# Patient Record
Sex: Male | Born: 1980 | Race: White | Hispanic: No | Marital: Single | Smoking: Current every day smoker
Health system: Southern US, Community
[De-identification: ages and names within clinical notes are randomized; demographics above are authoritative.]

## PROBLEM LIST (undated history)

## (undated) DIAGNOSIS — R079 Chest pain, unspecified: Secondary | ICD-10-CM

## (undated) DIAGNOSIS — N62 Hypertrophy of breast: Secondary | ICD-10-CM

## (undated) DIAGNOSIS — Z87438 Personal history of other diseases of male genital organs: Secondary | ICD-10-CM

## (undated) DIAGNOSIS — J449 Chronic obstructive pulmonary disease, unspecified: Secondary | ICD-10-CM

## (undated) DIAGNOSIS — I10 Essential (primary) hypertension: Secondary | ICD-10-CM

## (undated) DIAGNOSIS — J45909 Unspecified asthma, uncomplicated: Secondary | ICD-10-CM

## (undated) DIAGNOSIS — I5030 Unspecified diastolic (congestive) heart failure: Secondary | ICD-10-CM

## (undated) DIAGNOSIS — F1110 Opioid abuse, uncomplicated: Secondary | ICD-10-CM

## (undated) DIAGNOSIS — I509 Heart failure, unspecified: Secondary | ICD-10-CM

## (undated) DIAGNOSIS — B182 Chronic viral hepatitis C: Secondary | ICD-10-CM

## (undated) HISTORY — PX: TOOTH EXTRACTION: SUR596

## (undated) HISTORY — DX: Unspecified diastolic (congestive) heart failure: I50.30

## (undated) HISTORY — DX: Hypertrophy of breast: N62

## (undated) HISTORY — DX: Essential (primary) hypertension: I10

## (undated) HISTORY — DX: Chest pain, unspecified: R07.9

## (undated) HISTORY — DX: Heart failure, unspecified: I50.9

---

## 2004-08-13 ENCOUNTER — Emergency Department: Payer: Self-pay | Admitting: Emergency Medicine

## 2005-06-07 ENCOUNTER — Emergency Department: Payer: Self-pay | Admitting: Emergency Medicine

## 2006-12-25 ENCOUNTER — Emergency Department: Payer: Self-pay | Admitting: Emergency Medicine

## 2006-12-25 ENCOUNTER — Other Ambulatory Visit: Payer: Self-pay

## 2007-02-23 ENCOUNTER — Emergency Department: Payer: Self-pay | Admitting: Emergency Medicine

## 2008-02-14 ENCOUNTER — Other Ambulatory Visit: Payer: Self-pay

## 2008-02-14 ENCOUNTER — Emergency Department: Payer: Self-pay

## 2009-02-23 ENCOUNTER — Emergency Department: Payer: Self-pay | Admitting: Emergency Medicine

## 2009-09-15 ENCOUNTER — Emergency Department: Payer: Self-pay | Admitting: Internal Medicine

## 2010-02-25 ENCOUNTER — Emergency Department: Payer: Self-pay | Admitting: Emergency Medicine

## 2010-03-15 ENCOUNTER — Emergency Department (HOSPITAL_COMMUNITY): Admission: EM | Admit: 2010-03-15 | Discharge: 2010-03-15 | Payer: Self-pay | Admitting: Emergency Medicine

## 2010-10-22 ENCOUNTER — Emergency Department: Payer: Self-pay | Admitting: Emergency Medicine

## 2010-11-19 ENCOUNTER — Emergency Department: Payer: Self-pay | Admitting: Emergency Medicine

## 2010-11-23 ENCOUNTER — Emergency Department: Payer: Self-pay | Admitting: Emergency Medicine

## 2011-12-06 ENCOUNTER — Emergency Department: Payer: Self-pay | Admitting: Emergency Medicine

## 2012-02-26 ENCOUNTER — Emergency Department: Payer: Self-pay | Admitting: Emergency Medicine

## 2012-02-26 LAB — COMPREHENSIVE METABOLIC PANEL
Albumin: 3.9 g/dL (ref 3.4–5.0)
Alkaline Phosphatase: 98 U/L (ref 50–136)
Anion Gap: 8 (ref 7–16)
Bilirubin,Total: 0.4 mg/dL (ref 0.2–1.0)
Co2: 27 mmol/L (ref 21–32)
Creatinine: 1.26 mg/dL (ref 0.60–1.30)
Glucose: 89 mg/dL (ref 65–99)
Osmolality: 278 (ref 275–301)
Potassium: 3.8 mmol/L (ref 3.5–5.1)
Sodium: 140 mmol/L (ref 136–145)

## 2012-02-26 LAB — CBC
HCT: 47 % (ref 40.0–52.0)
MCV: 95 fL (ref 80–100)
RBC: 4.98 10*6/uL (ref 4.40–5.90)
RDW: 14 % (ref 11.5–14.5)
WBC: 7.8 10*3/uL (ref 3.8–10.6)

## 2012-03-02 LAB — CULTURE, BLOOD (SINGLE)

## 2012-11-18 ENCOUNTER — Emergency Department: Payer: Self-pay | Admitting: Emergency Medicine

## 2012-11-18 LAB — URINALYSIS, COMPLETE
Blood: NEGATIVE
Glucose,UR: NEGATIVE mg/dL (ref 0–75)
Ph: 5 (ref 4.5–8.0)
Protein: NEGATIVE
RBC,UR: 12 /HPF (ref 0–5)
WBC UR: 135 /HPF (ref 0–5)

## 2013-09-25 ENCOUNTER — Emergency Department: Payer: Self-pay | Admitting: Emergency Medicine

## 2013-09-25 LAB — URINALYSIS, COMPLETE
BACTERIA: NONE SEEN
BILIRUBIN, UR: NEGATIVE
BLOOD: NEGATIVE
Glucose,UR: NEGATIVE mg/dL (ref 0–75)
KETONE: NEGATIVE
LEUKOCYTE ESTERASE: NEGATIVE
Nitrite: NEGATIVE
Ph: 6 (ref 4.5–8.0)
Protein: NEGATIVE
RBC,UR: <1 /HPF (ref 0–5)
Specific Gravity: 1.011 (ref 1.003–1.030)
Squamous Epithelial: NONE SEEN
WBC UR: <1 /HPF (ref 0–5)

## 2013-09-25 LAB — COMPREHENSIVE METABOLIC PANEL
ALT: 26 U/L (ref 12–78)
AST: 20 U/L (ref 15–37)
Albumin: 3.7 g/dL (ref 3.4–5.0)
Alkaline Phosphatase: 63 U/L
Anion Gap: 3 — ABNORMAL LOW (ref 7–16)
BILIRUBIN TOTAL: 0.8 mg/dL (ref 0.2–1.0)
BUN: 12 mg/dL (ref 7–18)
CREATININE: 0.97 mg/dL (ref 0.60–1.30)
Calcium, Total: 8.8 mg/dL (ref 8.5–10.1)
Chloride: 107 mmol/L (ref 98–107)
Co2: 29 mmol/L (ref 21–32)
EGFR (African American): >60
EGFR (Non-African Amer.): >60
Glucose: 71 mg/dL (ref 65–99)
OSMOLALITY: 276 (ref 275–301)
POTASSIUM: 4 mmol/L (ref 3.5–5.1)
Sodium: 139 mmol/L (ref 136–145)
Total Protein: 6.4 g/dL (ref 6.4–8.2)

## 2013-09-25 LAB — CBC
HCT: 47.9 % (ref 40.0–52.0)
HGB: 15.8 g/dL (ref 13.0–18.0)
MCH: 32 pg (ref 26.0–34.0)
MCHC: 32.9 g/dL (ref 32.0–36.0)
MCV: 97 fL (ref 80–100)
PLATELETS: 143 10*3/uL — AB (ref 150–440)
RBC: 4.93 10*6/uL (ref 4.40–5.90)
RDW: 13.3 % (ref 11.5–14.5)
WBC: 8.2 10*3/uL (ref 3.8–10.6)

## 2013-09-25 LAB — LITHIUM LEVEL: LITHIUM: 0.43 mmol/L — AB

## 2014-04-10 ENCOUNTER — Emergency Department: Payer: Self-pay | Admitting: Internal Medicine

## 2014-04-14 ENCOUNTER — Emergency Department: Payer: Self-pay | Admitting: Emergency Medicine

## 2014-04-21 ENCOUNTER — Emergency Department: Payer: Self-pay | Admitting: Emergency Medicine

## 2014-05-12 ENCOUNTER — Emergency Department: Payer: Self-pay | Admitting: Emergency Medicine

## 2014-09-01 ENCOUNTER — Emergency Department
Admit: 2014-09-01 | Disposition: A | Discharge status: Home / Self Care | Payer: Self-pay | Admitting: Emergency Medicine

## 2014-09-05 ENCOUNTER — Inpatient Hospital Stay
Admit: 2014-09-05 | Disposition: A | Discharge status: Home / Self Care | Payer: Self-pay | Attending: Internal Medicine | Admitting: Internal Medicine

## 2014-09-05 LAB — COMPREHENSIVE METABOLIC PANEL
ALBUMIN: 3.5 g/dL
ALK PHOS: 94 U/L
ALT: 38 U/L
ANION GAP: 9 (ref 7–16)
BILIRUBIN TOTAL: 0.3 mg/dL
BUN: 9 mg/dL
CHLORIDE: 101 mmol/L
CREATININE: 0.96 mg/dL
Calcium, Total: 9.1 mg/dL
Co2: 31 mmol/L
EGFR (African American): >60
EGFR (Non-African Amer.): >60
GLUCOSE: 111 mg/dL — AB
Potassium: 3.8 mmol/L
SGOT(AST): 25 U/L
Sodium: 141 mmol/L
TOTAL PROTEIN: 7.4 g/dL

## 2014-09-05 LAB — ETHANOL: Ethanol: <5 mg/dL

## 2014-09-05 LAB — LACTATE DEHYDROGENASE: LDH: 155 U/L

## 2014-09-05 LAB — CBC
HCT: 44.7 % (ref 40.0–52.0)
HGB: 14.5 g/dL (ref 13.0–18.0)
MCH: 30.1 pg (ref 26.0–34.0)
MCHC: 32.5 g/dL (ref 32.0–36.0)
MCV: 93 fL (ref 80–100)
Platelet: 323 10*3/uL (ref 150–440)
RBC: 4.83 10*6/uL (ref 4.40–5.90)
RDW: 14.2 % (ref 11.5–14.5)
WBC: 20.8 10*3/uL — ABNORMAL HIGH (ref 3.8–10.6)

## 2014-09-05 LAB — ED INFLUENZA
Influenza A By PCR: NEGATIVE
Influenza B By PCR: NEGATIVE

## 2014-09-05 LAB — CK TOTAL AND CKMB (NOT AT ARMC)
CK, Total: 136 U/L
CK-MB: 5.9 ng/mL — AB

## 2014-09-05 LAB — TROPONIN I: TROPONIN-I: 0.06 ng/mL — AB

## 2014-09-05 LAB — PRO B NATRIURETIC PEPTIDE: B-TYPE NATIURETIC PEPTID: 259 pg/mL — AB

## 2014-09-06 LAB — LACTIC ACID, PLASMA
LACTIC ACID, VENOUS: 2.8 mmol/L — AB
Lactic Acid, Venous: 1.9 mmol/L

## 2014-09-06 LAB — DRUG SCREEN, URINE
Amphetamines, Ur Screen: NEGATIVE
BARBITURATES, UR SCREEN: NEGATIVE
Benzodiazepine, Ur Scrn: NEGATIVE
CANNABINOID 50 NG, UR ~~LOC~~: POSITIVE
COCAINE METABOLITE, UR ~~LOC~~: NEGATIVE
MDMA (ECSTASY) UR SCREEN: NEGATIVE
Methadone, Ur Screen: NEGATIVE
OPIATE, UR SCREEN: POSITIVE
Phencyclidine (PCP) Ur S: NEGATIVE
Tricyclic, Ur Screen: NEGATIVE

## 2014-09-06 LAB — COMPREHENSIVE METABOLIC PANEL
ANION GAP: 4 — AB (ref 7–16)
Albumin: 2.8 g/dL — ABNORMAL LOW
Alkaline Phosphatase: 91 U/L
BILIRUBIN TOTAL: 0.5 mg/dL
BUN: 10 mg/dL
CALCIUM: 7.9 mg/dL — AB
CHLORIDE: 104 mmol/L
CO2: 30 mmol/L
CREATININE: 0.92 mg/dL
Glucose: 153 mg/dL — ABNORMAL HIGH
Potassium: 4.4 mmol/L
SGOT(AST): 30 U/L
SGPT (ALT): 36 U/L
SODIUM: 138 mmol/L
Total Protein: 6.3 g/dL — ABNORMAL LOW

## 2014-09-06 LAB — CBC WITH DIFFERENTIAL/PLATELET
BASOS ABS: 0 10*3/uL (ref 0.0–0.1)
BASOS PCT: 0.1 %
Eosinophil #: 0 10*3/uL (ref 0.0–0.7)
Eosinophil %: 0 %
HCT: 40.4 % (ref 40.0–52.0)
HGB: 13 g/dL (ref 13.0–18.0)
LYMPHS PCT: 3.2 %
Lymphocyte #: 0.5 10*3/uL — ABNORMAL LOW (ref 1.0–3.6)
MCH: 29.9 pg (ref 26.0–34.0)
MCHC: 32.3 g/dL (ref 32.0–36.0)
MCV: 93 fL (ref 80–100)
MONOS PCT: 1.6 %
Monocyte #: 0.3 x10 3/mm (ref 0.2–1.0)
NEUTROS ABS: 15.9 10*3/uL — AB (ref 1.4–6.5)
NEUTROS PCT: 95.1 %
Platelet: 284 10*3/uL (ref 150–440)
RBC: 4.36 10*6/uL — ABNORMAL LOW (ref 4.40–5.90)
RDW: 14.1 % (ref 11.5–14.5)
WBC: 16.7 10*3/uL — AB (ref 3.8–10.6)

## 2014-09-06 LAB — TROPONIN I
TROPONIN-I: 0.03 ng/mL
Troponin-I: 0.05 ng/mL — ABNORMAL HIGH

## 2014-09-06 LAB — RAPID HIV SCREEN (HIV 1/2 AB+AG)

## 2014-09-08 LAB — EXPECTORATED SPUTUM ASSESSMENT W GRAM STAIN, RFLX TO RESP C

## 2014-09-10 LAB — CULTURE, BLOOD (SINGLE)

## 2014-09-25 NOTE — Discharge Summary (Signed)
PATIENT NAME:  Nathan Vasquez, [name] MR#:  [id number] DATE OF BIRTH:  Aug 05, 1980  DATE OF ADMISSION:  09/05/2014 DATE OF DISCHARGE:  09/07/2014  DISCHARGE DIAGNOSES: 1.  Sepsis.  2.  Pneumonia.  3.  Elevated troponins due to supply/demand ischemia.  4.  Substance abuse with urine drug screen being positive.   SECONDARY DIAGNOSIS: None.   CONSULTATIONS: None.   PROCEDURES AND RADIOLOGY: Chest x-ray on 11th of April showed possible bronchitis or bronchiolitis.   CT scan of the chest on 11th of April showed diffuse patchy tree-in-bird type nodular airspace opacities and bronchial wall thickening concerning for small airways infection. Small pericardial effusion.   MAJOR LABORATORY PANEL: Blood cultures x2 were negative on 11th of April.  Sputum culture grew normal flora.   Influenza test was negative.   HIV was nonreactive.   HISTORY AND SHORT HOSPITAL COURSE: The patient is a 34 year old male with no medical problems who was admitted for shortness of breath and was found to have sepsis secondary to pneumonia. Please see Dr. Deatra Inaavid Hower's dictated history and physical for further details. The patient was very anxious the next day wanting to leave. He was explained all the risks and benefit of leaving without complete treatment. Finally, he agreed to stay over but still left on the day of the 13th of April after back and forth discussion. He was not too happy about being in the hospital as he wanted to smoke outside, which was against policy. He was feeling much better by 13th of April anyway and was discharged home in stable condition.   PERTINENT DISCHARGE PHYSICAL EXAMINATION: VITAL SIGNS: On the date of discharge, his temperature was 97.9, heart rate 76 per minute, respirations 20 per minute, blood pressure 125/76 and he was saturating 90% on room air.  CARDIOVASCULAR: S1, S2 normal. No murmurs, rubs, or gallops.  LUNGS: Clear to auscultation bilaterally. No wheezing, rales, rhonchi, or  crepitation.  ABDOMEN: Soft, benign.  NEUROLOGIC: Nonfocal examination. All other physical examination remained at baseline.  DISCHARGE MEDICATIONS:  Medication Instructions  benzonatate 200 mg oral capsule  1 cap(s) orally every 8 hours x 5 days, As Needed, cough , As needed, cough   levaquin 750 mg oral tablet  1 tab(s) orally every 24 hours x 5 days   prednisone 10 mg oral tablet  Start at 60 mg and taper by 10 mg daily until complete     DISCHARGE DIET: Regular.   DISCHARGE ACTIVITY: As tolerated.   DISCHARGE INSTRUCTIONS AND FOLLOWUP: The patient was instructed to follow up with his primary care physician at [facility].  He remains at high risk for readmission.   TOTAL TIME DISCHARGING PATIENT: 45 minutes. ____________________________ Ellamae SiaVipul S. Sherryll BurgerShah, MD vss:sb D: 09/08/2014 12:27:27 ET T: 09/08/2014 12:46:58 ET JOB#: [id number]  cc: Lindsie Simar S. Sherryll BurgerShah, MD, <Dictator> [facility] - PCP Ellamae SiaVIPUL S [facility] ELECTRONICALLY SIGNED 09/08/2014 13:24

## 2014-09-25 NOTE — H&P (Signed)
PATIENT NAME:  Nathan Vasquez, Nathan R MR#:  [id number] DATE OF BIRTH:  1981-04-07  DATE OF ADMISSION:  09/06/2014  REFERRING PHYSICIAN:  Cecille AmsterdamJonathan E. Mayford KnifeWilliams, MD   PRIMARY CARE PHYSICIAN:  None.   CHIEF COMPLAINT:  Shortness of breath.   HISTORY OF PRESENT ILLNESS:  A 34 year old Caucasian gentleman without significant past medical history, as he seeks no medical attention, presenting with cough and shortness of breath. He describes 10 days in total duration of symptoms originally starting as URI with associated cough, which has subsequently become productive of greenish sputum. He was originally evaluated at [facility] Emergency Department 5 days ago and discharged with essentially cough medication and some steroids. He had no improvement. He subsequently developed fevers, chills, and shortness of breath. The cough worsened. It is still productive of greenish sputum. He decided to present to the hospital for further workup and evaluation. Upon arrival to the Emergency Department, he was noted to be febrile, tachycardic, and hypoxic, saturating 85% on room air, requiring supplemental O2 to keep SaO2 greater than 92%.   REVIEW OF SYSTEMS: CONSTITUTIONAL:  Positive for fevers, chills, fatigue, and weakness.  EYES:  Denies blurred vision, double vision, or eye pain.  EARS, NOSE, AND THROAT:  Denies tinnitus, ear pain, or hearing loss.  RESPIRATORY:  Positive for cough, wheeze, or shortness of breath.  CARDIOVASCULAR:  Denies chest pain, palpitations, or edema.  GASTROINTESTINAL:  Denies nausea, vomiting, diarrhea, or abdominal pain.  GENITOURINARY:  Denies history of hematuria.  ENDOCRINE:  Denies nocturia or thyroid problems.  HEMATOLOGIC AND LYMPHATIC:  Denies easy bruising or bleeding.  SKIN:  Denies rash or lesion.  MUSCULOSKELETAL:  Denies pain in the neck, back, shoulders, knees, or hips or arthritic symptoms.  NEUROLOGIC:  Denies paralysis or paresthesias.  PSYCHIATRIC:   Denies anxiety or depressive symptoms.   Otherwise, full review of systems performed by me is negative.   PAST MEDICAL HISTORY:  None. He seeks no medical care.   SOCIAL HISTORY:  Everyday tobacco use. Denies any alcohol or drug use.   FAMILY HISTORY:  Positive for coronary artery disease.   ALLERGIES:  PENICILLIN.   HOME MEDICATIONS:  None.   PHYSICAL EXAMINATION: VITAL SIGNS:  Temperature 102 degrees Fahrenheit, heart rate 125, respirations 24, blood pressure 134/79, saturating 96% on supplemental oxygen. Weight 101.8 kg, BMI 30.5.  GENERAL:  Ill-appearing Caucasian gentleman in minimal respiratory distress.  HEAD:  Normocephalic, atraumatic.  EYES:  Pupils are equal, round, and reactive to light. Extraocular muscles are intact. No scleral icterus.  MOUTH:  Moist mucosal membrane. Dentition is intact. No abscess noted.  EARS, NOSE, AND THROAT:  Clear without exudates. No external lesions.  NECK:  Supple. No thyromegaly. No nodules. No JVD.  PULMONARY:  Greatly diminished breath sounds throughout all lung fields. Minimal basilar coarse rhonchi. No wheezing. Tachypneic without the use of accessory muscles.  CHEST:  Nontender to palpation.  CARDIOVASCULAR:  S1 and S2, tachycardic. No murmurs, rubs, or gallops. No edema. Pedal pulses are 2+ bilaterally.  GASTROINTESTINAL:  Soft, nontender, nondistended. No masses. Positive bowel sounds. No hepatosplenomegaly.  MUSCULOSKELETAL:  No swelling, clubbing, or edema. Full range of motion in all extremities.  NEUROLOGIC:  Cranial nerves II through XII are intact. No gross focal neurological deficits. Sensation is intact. Reflexes are intact.  SKIN:  No ulceration, lesions, rashes, or cyanosis. Skin is diaphoretic and warm.  PSYCHIATRIC:  Mood and affect are within normal limits. The patient is awake, alert, and oriented x  3. Insight and judgment are intact.   LABORATORY DATA:  Chest x-ray performed, which reveals hyperinflation, bronchial  thickening suggestive of bronchitis versus bronchiolitis. CT of the chest performed is negative for PE, diffuse patchy type nodular airspace opacifications. Remainder of laboratory data:  Sodium 141, potassium 3.8, chloride 101, bicarbonate 31, BUN 9, creatinine 0.96, glucose of 111. LFTs are within normal limits. Troponin is 0.06. WBC is 20.8, hemoglobin 14.5, and platelets are 323,000. ABG performed:  PH of 7.43, CO2 of 46, and O2 of 59.   ASSESSMENT AND PLAN:  A 34 year old Caucasian gentleman without significant past medical history, presenting with cough.   1.  Sepsis secondary to community-acquired pneumonia, meeting septic criteria by temperature, leukocytosis, heart rate, respiratory rate, secondary to community-acquired pneumonia. I will panculture including blood and sputum. Follow culture data. Adjust antibiotics accordingly. Intravenous fluid hydration received 30 mL/kg IV fluid bolus. Continue IV fluid hydration to keep mean arterial pressure greater than 65. As far as antibiotic coverage, he has already received Levaquin and aztreonam in the Emergency Department. We will continue this antibiotic coverage given his PENICILLIN ALLERGY. We will provide DuoNeb treatments q. 4 hours and supplemental oxygen to keep SaO2 greater than 92%, as well as steroids, Solu-Medrol 60 mg IV daily. HIV test is also ordered and pending at this time.  2.  Elevated troponin. We will place on telemetry. Trend cardiac enzymes x 3; if remarkably upward trend, we will place on heparin drip. No indication at this time.  3.  Venous thromboembolism prophylaxis with heparin subcutaneously.   CODE STATUS:  The patient is a full code.   TIME SPENT:  45 minutes.    ____________________________ Cletis Athens. Hower, MD dkh:nb D: 09/06/2014 02:29:59 ET T: 09/06/2014 04:01:40 ET JOB#: [id number]  cc: Cletis Athens. Hower, MD, <Dictator> DAVID Synetta Shadow MD ELECTRONICALLY SIGNED 09/07/2014 2:11

## 2015-01-02 ENCOUNTER — Emergency Department
Admission: EM | Admit: 2015-01-02 | Discharge: 2015-01-02 | Disposition: A | Discharge status: Home / Self Care | Payer: BLUE CROSS/BLUE SHIELD | Attending: Emergency Medicine | Admitting: Emergency Medicine

## 2015-01-02 ENCOUNTER — Encounter: Payer: Self-pay | Admitting: Emergency Medicine

## 2015-01-02 DIAGNOSIS — Y998 Other external cause status: Secondary | ICD-10-CM | POA: Insufficient documentation

## 2015-01-02 DIAGNOSIS — Y9389 Activity, other specified: Secondary | ICD-10-CM | POA: Insufficient documentation

## 2015-01-02 DIAGNOSIS — Y9289 Other specified places as the place of occurrence of the external cause: Secondary | ICD-10-CM | POA: Diagnosis not present

## 2015-01-02 DIAGNOSIS — Z720 Tobacco use: Secondary | ICD-10-CM | POA: Insufficient documentation

## 2015-01-02 DIAGNOSIS — K0889 Other specified disorders of teeth and supporting structures: Secondary | ICD-10-CM

## 2015-01-02 DIAGNOSIS — S0993XA Unspecified injury of face, initial encounter: Secondary | ICD-10-CM | POA: Insufficient documentation

## 2015-01-02 HISTORY — DX: Unspecified asthma, uncomplicated: J45.909

## 2015-01-02 MED ORDER — TRAMADOL HCL 50 MG PO TABS: 50.0000 mg | ORAL_TABLET | Freq: Four times a day (QID) | ORAL | Status: DC | PRN

## 2015-01-02 MED ORDER — CLINDAMYCIN HCL 150 MG PO CAPS: 300.0000 mg | ORAL_CAPSULE | Freq: Three times a day (TID) | ORAL | Status: DC

## 2015-01-02 MED ORDER — TRAMADOL HCL 50 MG PO TABS
50.0000 mg | ORAL_TABLET | Freq: Once | ORAL | Status: AC
  Administered 2015-01-02: 50 mg via ORAL
  Filled 2015-01-02: qty 1

## 2015-01-02 MED ORDER — CLINDAMYCIN HCL 150 MG PO CAPS
300.0000 mg | ORAL_CAPSULE | Freq: Once | ORAL | Status: AC
  Administered 2015-01-02: 300 mg via ORAL
  Filled 2015-01-02: qty 2

## 2015-01-02 MED ORDER — CLINDAMYCIN HCL 150 MG PO CAPS: ORAL_CAPSULE | ORAL | Status: DC

## 2015-01-02 MED ORDER — CLINDAMYCIN HCL 150 MG PO CAPS: 300.0000 mg | ORAL_CAPSULE | Freq: Four times a day (QID) | ORAL | Status: DC
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
## 2015-01-02 NOTE — Discharge Instructions (Signed)
Dental Pain °Toothache is pain in or around a tooth. It may get worse with chewing or with cold or heat.  °HOME CARE °· Your dentist may use a numbing medicine during treatment. If so, you may need to avoid eating until the medicine wears off. Ask your dentist about this. °· Only take medicine as told by your dentist or doctor. °· Avoid chewing food near the painful tooth until after all treatment is done. Ask your dentist about this. °GET HELP RIGHT AWAY IF:  °· The problem gets worse or new problems appear. °· You have a fever. °· There is redness and puffiness (swelling) of the face, jaw, or neck. °· You cannot open your mouth. °· There is pain in the jaw. °· There is very bad pain that is not helped by medicine. °MAKE SURE YOU:  °· Understand these instructions. °· Will watch your condition. °· Will get help right away if you are not doing well or get worse. °Document Released: 10/30/2007 Document Revised: 08/05/2011 Document Reviewed: 10/30/2007 °ExitCare® Patient Information ©2015 [employer]. This information is not intended to replace advice given to you by your health care provider. Make sure you discuss any questions you have with your health care provider. °OPTIONS FOR DENTAL FOLLOW UP CARE ° °[place] Department of Health and Human Services - Local Safety Net Dental Clinics °http://www.ncdhhs.gov/dph/oralhealth/services/safetynetclinics.htm °  °[facility] (336-562-3123) ° °[facility] [place] (919-933-9087) ° °[facility] (919-663-1744 ext [phone]) ° °[facility] (336-570-6415) ° °[facility] (919-968-2025) °This clinic caters to the indigent population and is on a lottery system. °Location: °[facility], [address], [place] °Clinic Hours: °Wednesdays from 6pm - 9pm, patients seen by a lottery system. °For dates, call or go to www.med.[facility].edu/[facility]/patients/Dental-[facility] °Services: °Cleanings, fillings and simple  extractions. °Payment Options: °DENTAL WORK IS FREE OF CHARGE. Bring proof of income or support. °Best way to get seen: °Arrive at 5:15 pm - this is a lottery, NOT first come/first serve, so arriving earlier will not increase your chances of being seen. °  °  °[facility] °919-537-3737 °Select option 1 for emergencies °  °Location: °[facility], [address], [place] °Clinic Hours: °No walk-ins accepted - call the day before to schedule an appointment. °Check in times are 9:30 am and 1:30 pm. °Services: °Simple extractions, temporary fillings, pulpectomy/pulp debridement, uncomplicated abscess drainage. °Payment Options: °PAYMENT IS DUE AT THE TIME OF SERVICE.  Fee is usually $100-200, additional surgical procedures (e.g. abscess drainage) may be extra. °Cash, checks, Visa/MasterCard accepted.  Can file Medicaid if patient is covered for dental - patient should call case worker to check. °No discount for [facility] patients. °Best way to get seen: °MUST call the day before and get onto the schedule. Can usually be seen the next 1-2 days. No walk-ins accepted. °  °  °[facility] °919-933-9087 °  °Location: °[facility], [address], [place] °Clinic Hours: °M, W, Th, F 8am or 1:30pm, Tues 9a or 1:30 - first come/first served. °Services: °Simple extractions, temporary fillings, uncomplicated abscess drainage.  You do not need to be an [place] resident. °Payment Options: °PAYMENT IS DUE AT THE TIME OF SERVICE. °Dental insurance, otherwise sliding scale - bring proof of income or support. °Depending on income and treatment needed, cost is usually $50-200. °Best way to get seen: °Arrive early as it is first come/first served. °  °  °  [facility] °[phone] °  °Location: °[address] °Clinic Hours: °Mon-Thu 8a-5p °Services: °Most basic dental services including  extractions and fillings. °Payment Options: °PAYMENT IS DUE AT THE TIME OF SERVICE. °Sliding scale, up to 50% off - bring proof if income or support. °Medicaid with dental option accepted. °Best way to get seen: °Call to schedule an appointment, can usually be seen within 2 weeks OR they will try to see walk-ins - show up at 8a or 2p (you may have to wait). °  °  °[facility] °919-245-2435 °[place] RESIDENTS ONLY °  °Location: °[facility], [address], [place], [place] [address] °Clinic Hours: By appointment only. °Monday - Thursday 8am-5pm, Friday 8am-12pm °Services: Cleanings, fillings, extractions. °Payment Options: °PAYMENT IS DUE AT THE TIME OF SERVICE. °Cash, Visa or MasterCard. Sliding scale - $30 minimum per service. °Best way to get seen: °Come in to office, complete packet and make an appointment - need proof of income °or support monies for each household member and proof of [place] residence. °Usually takes about a month to get in. °  °  °[facility] °[phone] °  °Location: °[address] [place] °Clinic Hours: Walk-in Urgent Care Dental Services are offered Monday-Friday mornings only. °The numbers of emergencies accepted daily is limited to the number of °providers available. °Maximum 15 - Mondays, Wednesdays & Thursdays °Maximum 10 - Tuesdays & Fridays °Services: °You do not need to be a [place] resident to be seen for a dental emergency. °Emergencies are defined as pain, swelling, abnormal bleeding, or dental trauma. Walkins will receive x-rays if needed. °NOTE: Dental cleaning is not an emergency. °Payment Options: °PAYMENT IS DUE AT THE TIME OF SERVICE. °Minimum co-pay is $40.00 for uninsured patients. °Minimum co-pay is $3.00 for Medicaid with dental coverage. °Dental Insurance is accepted and must be presented at time of visit. °Medicare does not cover dental. °Forms of payment: Cash, credit card,  checks. °Best way to get seen: °If not previously registered with the clinic, walk-in dental registration begins at 7:15 am and is on a first come/first serve basis. °If previously registered with the clinic, call to make an appointment. °  °  °[facility] °919-776-4359 °[place] RESIDENTS ONLY °  °Location: °[address], [place], [place] °Clinic Hours: °Mon-Thu 10a-2p °Services: Extractions only! °Payment Options: °FREE (donations accepted) - bring proof of income or support °Best way to get seen: °Call and schedule an appointment OR come at 8am on the 1st Monday of every month (except for holidays) when it is first come/first served. °  °  °[facility] °[phone] °  °Location: °[address], [place] °Clinic Hours: °Friday mornings °Services, Payment Options, Best way to get seen: °Call for info ° °
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
## 2015-04-11 ENCOUNTER — Emergency Department
Admission: EM | Admit: 2015-04-11 | Discharge: 2015-04-11 | Disposition: A | Discharge status: Home / Self Care | Payer: BLUE CROSS/BLUE SHIELD | Attending: Emergency Medicine | Admitting: Emergency Medicine

## 2015-04-11 ENCOUNTER — Encounter: Payer: Self-pay | Admitting: Emergency Medicine

## 2015-04-11 DIAGNOSIS — Z792 Long term (current) use of antibiotics: Secondary | ICD-10-CM | POA: Insufficient documentation

## 2015-04-11 DIAGNOSIS — Y998 Other external cause status: Secondary | ICD-10-CM | POA: Insufficient documentation

## 2015-04-11 DIAGNOSIS — Y9389 Activity, other specified: Secondary | ICD-10-CM | POA: Insufficient documentation

## 2015-04-11 DIAGNOSIS — L03113 Cellulitis of right upper limb: Secondary | ICD-10-CM

## 2015-04-11 DIAGNOSIS — Y9289 Other specified places as the place of occurrence of the external cause: Secondary | ICD-10-CM | POA: Insufficient documentation

## 2015-04-11 DIAGNOSIS — Z880 Allergy status to penicillin: Secondary | ICD-10-CM | POA: Insufficient documentation

## 2015-04-11 DIAGNOSIS — S01511A Laceration without foreign body of lip, initial encounter: Secondary | ICD-10-CM

## 2015-04-11 DIAGNOSIS — F17200 Nicotine dependence, unspecified, uncomplicated: Secondary | ICD-10-CM | POA: Insufficient documentation

## 2015-04-11 HISTORY — DX: Chronic viral hepatitis C: B18.2

## 2015-04-11 MED ORDER — TETANUS-DIPHTH-ACELL PERTUSSIS 5-2.5-18.5 LF-MCG/0.5 IM SUSP
0.5000 mL | Freq: Once | INTRAMUSCULAR | Status: DC
  Filled 2015-04-11: qty 0.5

## 2015-04-11 MED ORDER — CEFTRIAXONE SODIUM 1 G IJ SOLR
1.0000 g | Freq: Once | INTRAMUSCULAR | Status: AC
Start: 2015-04-11 — End: 2015-04-11
  Administered 2015-04-11: 1 g via INTRAMUSCULAR
  Filled 2015-04-11: qty 10

## 2015-04-11 MED ORDER — SULFAMETHOXAZOLE-TRIMETHOPRIM 800-160 MG PO TABS: 1.0000 | ORAL_TABLET | Freq: Two times a day (BID) | ORAL | Status: DC

## 2015-04-11 MED ORDER — IBUPROFEN 800 MG PO TABS
800.0000 mg | ORAL_TABLET | Freq: Once | ORAL | Status: AC
  Administered 2015-04-11: 800 mg via ORAL
  Filled 2015-04-11: qty 1

## 2015-04-11 MED ORDER — LIDOCAINE HCL (PF) 1 % IJ SOLN
INTRAMUSCULAR | Status: AC
  Filled 2015-04-11: qty 10
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
## 2015-04-11 NOTE — Discharge Instructions (Signed)
Cellulitis Cellulitis is an infection of the skin and the tissue beneath it. The infected area is usually red and tender. Cellulitis occurs most often in the arms and lower legs.  CAUSES  Cellulitis is caused by bacteria that enter the skin through cracks or cuts in the skin. The most common types of bacteria that cause cellulitis are staphylococci and streptococci. SIGNS AND SYMPTOMS   Redness and warmth.  Swelling.  Tenderness or pain.  Fever. DIAGNOSIS  Your health care provider can usually determine what is wrong based on a physical exam. Blood tests may also be done. TREATMENT  Treatment usually involves taking an antibiotic medicine. HOME CARE INSTRUCTIONS   Take your antibiotic medicine as directed by your health care provider. Finish the antibiotic even if you start to feel better.  Keep the infected arm or leg elevated to reduce swelling.  Apply a warm cloth to the affected area up to 4 times per day to relieve pain.  Take medicines only as directed by your health care provider.  Keep all follow-up visits as directed by your health care provider. SEEK MEDICAL CARE IF:   You notice red streaks coming from the infected area.  Your red area gets larger or turns dark in color.  Your bone or joint underneath the infected area becomes painful after the skin has healed.  Your infection returns in the same area or another area.  You notice a swollen bump in the infected area.  You develop new symptoms.  You have a fever. SEEK IMMEDIATE MEDICAL CARE IF:   You feel very sleepy.  You develop vomiting or diarrhea.  You have a general ill feeling (malaise) with muscle aches and pains.   This information is not intended to replace advice given to you by your health care provider. Make sure you discuss any questions you have with your health care provider.   Document Released: 02/20/2005 Document Revised: 02/01/2015 Document Reviewed: 07/29/2011 Elsevier Interactive  Patient Education 2016 [employer]  Facial Laceration  A facial laceration is a cut on the face. These injuries can be painful and cause bleeding. Lacerations usually heal quickly, but they need special care to reduce scarring. DIAGNOSIS  Your health care provider will take a medical history, ask for details about how the injury occurred, and examine the wound to determine how deep the cut is. TREATMENT  Some facial lacerations may not require closure. Others may not be able to be closed because of an increased risk of infection. The risk of infection and the chance for successful closure will depend on various factors, including the amount of time since the injury occurred. The wound may be cleaned to help prevent infection. If closure is appropriate, pain medicines may be given if needed. Your health care provider will use stitches (sutures), wound glue (adhesive), or skin adhesive strips to repair the laceration. These tools bring the skin edges together to allow for faster healing and a better cosmetic outcome. If needed, you may also be given a tetanus shot. HOME CARE INSTRUCTIONS  Only take over-the-counter or prescription medicines as directed by your health care provider.  Follow your health care provider's instructions for wound care. These instructions will vary depending on the technique used for closing the wound. For Sutures:  Keep the wound clean and dry.   If you were given a bandage (dressing), you should change it at least once a day. Also change the dressing if it becomes wet or dirty, or as directed by  your health care provider.   Wash the wound with soap and water 2 times a day. Rinse the wound off with water to remove all soap. Pat the wound dry with a clean towel.   After cleaning, apply a thin layer of the antibiotic ointment recommended by your health care provider. This will help prevent infection and keep the dressing from sticking.   You may shower as usual  after the first 24 hours. Do not soak the wound in water until the sutures are removed.   Get your sutures removed as directed by your health care provider. With facial lacerations, sutures should usually be taken out after 4-5 days to avoid stitch marks.   Wait a few days after your sutures are removed before applying any makeup. For Skin Adhesive Strips:  Keep the wound clean and dry.   Do not get the skin adhesive strips wet. You may bathe carefully, using caution to keep the wound dry.   If the wound gets wet, pat it dry with a clean towel.   Skin adhesive strips will fall off on their own. You may trim the strips as the wound heals. Do not remove skin adhesive strips that are still stuck to the wound. They will fall off in time.  For Wound Adhesive:  You may briefly wet your wound in the shower or bath. Do not soak or scrub the wound. Do not swim. Avoid periods of heavy sweating until the skin adhesive has fallen off on its own. After showering or bathing, gently pat the wound dry with a clean towel.   Do not apply liquid medicine, cream medicine, ointment medicine, or makeup to your wound while the skin adhesive is in place. This may loosen the film before your wound is healed.   If a dressing is placed over the wound, be careful not to apply tape directly over the skin adhesive. This may cause the adhesive to be pulled off before the wound is healed.   Avoid prolonged exposure to sunlight or tanning lamps while the skin adhesive is in place.  The skin adhesive will usually remain in place for 5-10 days, then naturally fall off the skin. Do not pick at the adhesive film.  After Healing: Once the wound has healed, cover the wound with sunscreen during the day for 1 full year. This can help minimize scarring. Exposure to ultraviolet light in the first year will darken the scar. It can take 1-2 years for the scar to lose its redness and to heal completely.  SEEK MEDICAL CARE  IF:  You have a fever. SEEK IMMEDIATE MEDICAL CARE IF:  You have redness, pain, or swelling around the wound.   You see ayellowish-white fluid (pus) coming from the wound.    This information is not intended to replace advice given to you by your health care provider. Make sure you discuss any questions you have with your health care provider.   Document Released: 06/20/2004 Document Revised: 06/03/2014 Document Reviewed: 12/24/2012 Elsevier Interactive Patient Education 2016 [employer]  Mouth Laceration A mouth laceration is a deep cut in the lining of your mouth (mucosa). The laceration may extend into your lip or go all of the way through your mouth and cheek. Lacerations inside your mouth may involve your tongue, the insides of your cheeks, or the upper surface of your mouth (palate). Mouth lacerations may bleed a lot because your mouth has a very rich blood supply. Mouth lacerations may need to be repaired  with stitches (sutures). CAUSES Any type of facial injury can cause a mouth laceration. Common causes include:  Getting hit in the mouth.  Being in a car accident. SYMPTOMS The most common sign of a mouth laceration is bleeding that fills the mouth. DIAGNOSIS Your health care provider can diagnose a mouth laceration by examining your mouth. Your mouth may need to be washed out (irrigated) with a sterile salt-water (saline) solution. Your health care provider may also have to remove any blood clots to determine how bad your injury is. You may need X-rays of the bones in your jaw or your face to rule out other injuries, such as dental injuries, facial fractures, or jaw fractures. TREATMENT Treatment depends on the location and severity of your injury. Small mouth lacerations may not need treatment if bleeding has stopped. You may need sutures if:  You have a tongue laceration.  Your mouth laceration is large or deep, or it continues to bleed. If sutures are necessary,  your health care provider will use absorbable sutures that dissolve as your body heals. You may also receive antibiotic medicine or a tetanus shot. HOME CARE INSTRUCTIONS  Take medicines only as directed by your health care provider.  If you were prescribed an antibiotic medicine, finish all of it even if you start to feel better.  Eat as directed by your health care provider. You may only be able to drink liquids or eat soft foods for a few days.  Rinse your mouth with a warm, salt-water rinse 4-6 times per day or as directed by your health care provider. You can make a salt-water rinse by mixing one tsp of salt into two cups of warm water.  Do not poke the sutures with your tongue. Doing that can loosen them.  Check your wound every day for signs of infection. It is normal to have a white or gray patch over your wound while it heals. Watch for:  Redness.  Swelling.  Blood or pus.  Maintain regular oral hygiene, if possible. Gently brush your teeth with a soft, nylon-bristled toothbrush 2 times per day.  Keep all follow-up visits as directed by your health care provider. This is important. SEEK MEDICAL CARE IF:  You were given a tetanus shot and have swelling, severe pain, redness, or bleeding at the injection site.  You have a fever.  Your pain is not controlled with medicine.  You have redness, swelling, or pain at your wound that is getting worse.  You have fresh bleeding or pus coming from your wound.  The edges of your wound break open.  You develop swollen, tender glands in your throat. SEEK IMMEDIATE MEDICAL CARE IF:   Your face or the area under your jaw becomes swollen.  You have trouble breathing or swallowing.   This information is not intended to replace advice given to you by your health care provider. Make sure you discuss any questions you have with your health care provider.   Document Released: 05/13/2005 Document Revised: 09/27/2014 Document Reviewed:  05/04/2014 Elsevier Interactive Patient Education [employer]

## 2015-04-11 NOTE — ED Notes (Signed)
Pt dc home refused to wait shot time, instructed on follow up plan and med use,  during the entire dc process pt stated he was not going to follow up nor get his RX for antibiotics filled. PT NAD AT DC
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
## 2015-10-13 ENCOUNTER — Encounter: Payer: Self-pay | Admitting: Emergency Medicine

## 2015-10-13 ENCOUNTER — Emergency Department
Admission: EM | Admit: 2015-10-13 | Discharge: 2015-10-13 | Disposition: A | Discharge status: Home / Self Care | Payer: BLUE CROSS/BLUE SHIELD | Attending: Emergency Medicine | Admitting: Emergency Medicine

## 2015-10-13 DIAGNOSIS — K047 Periapical abscess without sinus: Secondary | ICD-10-CM

## 2015-10-13 DIAGNOSIS — J45909 Unspecified asthma, uncomplicated: Secondary | ICD-10-CM | POA: Insufficient documentation

## 2015-10-13 DIAGNOSIS — F17200 Nicotine dependence, unspecified, uncomplicated: Secondary | ICD-10-CM | POA: Insufficient documentation

## 2015-10-13 MED ORDER — IBUPROFEN 800 MG PO TABS: 800.0000 mg | ORAL_TABLET | Freq: Three times a day (TID) | ORAL | Status: DC | PRN

## 2015-10-13 MED ORDER — OXYCODONE-ACETAMINOPHEN 5-325 MG PO TABS
1.0000 | ORAL_TABLET | Freq: Once | ORAL | Status: AC
  Administered 2015-10-13: 1 via ORAL
  Filled 2015-10-13: qty 1

## 2015-10-13 MED ORDER — OXYCODONE-ACETAMINOPHEN 5-325 MG PO TABS: 1.0000 | ORAL_TABLET | Freq: Four times a day (QID) | ORAL | Status: DC | PRN

## 2015-10-13 MED ORDER — CLINDAMYCIN PHOSPHATE 300 MG/2ML IJ SOLN
INTRAMUSCULAR | Status: AC
  Filled 2015-10-13: qty 2

## 2015-10-13 MED ORDER — CLINDAMYCIN HCL 300 MG PO CAPS: 300.0000 mg | ORAL_CAPSULE | Freq: Three times a day (TID) | ORAL | Status: DC

## 2015-10-13 MED ORDER — IBUPROFEN 800 MG PO TABS
800.0000 mg | ORAL_TABLET | Freq: Once | ORAL | Status: AC
  Administered 2015-10-13: 800 mg via ORAL
  Filled 2015-10-13: qty 1

## 2015-10-13 MED ORDER — CLINDAMYCIN PHOSPHATE 900 MG/6ML IJ SOLN
600.0000 mg | Freq: Once | INTRAMUSCULAR | Status: AC
Start: 2015-10-13 — End: 2015-10-13
  Administered 2015-10-13: 600 mg via INTRAMUSCULAR

## 2015-10-13 NOTE — ED Notes (Addendum)
Pt reports dental pain and swelling to right upper jaw since last night. Pt states he has no income and no ride to get to a dentist. Pt alert & oriented with NAD noted. Pt also reports chills.

## 2015-10-13 NOTE — Discharge Instructions (Signed)
Dental Abscess A dental abscess is a collection of pus in or around a tooth. CAUSES This condition is caused by a bacterial infection around the root of the tooth that involves the inner part of the tooth (pulp). It may result from:  Severe tooth decay.  Trauma to the tooth that allows bacteria to enter into the pulp, such as a broken or chipped tooth.  Severe gum disease around a tooth. SYMPTOMS Symptoms of this condition include:  Severe pain in and around the infected tooth.  Swelling and redness around the infected tooth, in the mouth, or in the face.  Tenderness.  Pus drainage.  Bad breath.  Bitter taste in the mouth.  Difficulty swallowing.  Difficulty opening the mouth.  Nausea.  Vomiting.  Chills.  Swollen neck glands.  Fever. DIAGNOSIS This condition is diagnosed with examination of the infected tooth. During the exam, your dentist may tap on the infected tooth. Your dentist will also ask about your medical and dental history and may order X-rays. TREATMENT This condition is treated by eliminating the infection. This may be done with:  Antibiotic medicine.  A root canal. This may be performed to save the tooth.  Pulling (extracting) the tooth. This may also involve draining the abscess. This is done if the tooth cannot be saved. HOME CARE INSTRUCTIONS  Take medicines only as directed by your dentist.  If you were prescribed antibiotic medicine, finish all of it even if you start to feel better.  Rinse your mouth (gargle) often with salt water to relieve pain or swelling.  Do not drive or operate heavy machinery while taking pain medicine.  Do not apply heat to the outside of your mouth.  Keep all follow-up visits as directed by your dentist. This is important. SEEK MEDICAL CARE IF:  Your pain is worse and is not helped by medicine. SEEK IMMEDIATE MEDICAL CARE IF:  You have a fever or chills.  Your symptoms suddenly get worse.  You have a  very bad headache.  You have problems breathing or swallowing.  You have trouble opening your mouth.  You have swelling in your neck or around your eye.   This information is not intended to replace advice given to you by your health care provider. Make sure you discuss any questions you have with your health care provider.   Document Released: 05/13/2005 Document Revised: 09/27/2014 Document Reviewed: 05/10/2014 Elsevier Interactive Patient Education 2016 [employer]   You have signs of an early dental infection. Take antibiotics as directed. Use pain medicine as needed. Return to the emergency room for any worsening symptoms. Follow up with a dentist if you are able.  OPTIONS FOR DENTAL FOLLOW UP CARE  [place] Department of Health and Human Services - Local Safety Net Dental Clinics TripDoors.comhttp://www.ncdhhs.gov/dph/oralhealth/services/safetynetclinics.htm   [facility] [phone])  [facility] [phone])  [facility] [phone]  [facility] [phone])  [facility] [phone]) This clinic caters to the indigent population and is on a lottery system. Location: [employer], [address], [address], [place] Clinic Hours: Wednesdays from 6pm - 9pm, patients seen by a lottery system. For dates, call or go to [email or web address] Services: Cleanings, fillings and simple extractions. Payment Options: DENTAL WORK IS FREE OF CHARGE. Bring proof of income or support. Best way to get seen: Arrive at 5:15 pm - this is a lottery, NOT first come/first serve, so arriving earlier will not increase your chances of  being seen.     [facility] [phone] Select option 1 for emergencies   Location: [facility], [place], [address] [place] Clinic Hours: No walk-ins accepted - call the day before to schedule an  appointment. Check in times are 9:30 am and 1:30 pm. Services: Simple extractions, temporary fillings, pulpectomy/pulp debridement, uncomplicated abscess drainage. Payment Options: PAYMENT IS DUE AT THE TIME OF SERVICE.  Fee is usually $100-200, additional surgical procedures (e.g. abscess drainage) may be extra. Cash, checks, Visa/MasterCard accepted.  Can file Medicaid if patient is covered for dental - patient should call case worker to check. No discount for [facility] patients. Best way to get seen: MUST call the day before and get onto the schedule. Can usually be seen the next 1-2 days. No walk-ins accepted.     [facility] [phone]   Location: [facility], [address] [place] Clinic Hours: M, W, Th, F 8am or 1:30pm, Tues 9a or 1:30 - first come/first served. Services: Simple extractions, temporary fillings, uncomplicated abscess drainage.  You do not need to be an [facility] resident. Payment Options: PAYMENT IS DUE AT THE TIME OF SERVICE. Dental insurance, otherwise sliding scale - bring proof of income or support. Depending on income and treatment needed, cost is usually $50-200. Best way to get seen: Arrive early as it is first come/first served.     [facility] [phone]   Location: [address] Clinic Hours: Mon-Thu 8a-5p Services: Most basic dental services including extractions and fillings. Payment Options: PAYMENT IS DUE AT THE TIME OF SERVICE. Sliding scale, up to 50% off - bring proof if income or support. Medicaid with dental option accepted. Best way to get seen: Call to schedule an appointment, can usually be seen within 2 weeks OR they will try to see walk-ins - show up at 8a or 2p (you may have to wait).     [facility] [phone] [place] RESIDENTS ONLY   Location: [facility], [address]  [place], [place] [address] Clinic Hours: By appointment only. Monday - Thursday 8am-5pm, Friday 8am-12pm Services: Cleanings, fillings, extractions. Payment Options: PAYMENT IS DUE AT THE TIME OF SERVICE. Cash, Visa or MasterCard. Sliding scale - $30 minimum per service. Best way to get seen: Come in to office, complete packet and make an appointment - need proof of income or support monies for each household member and proof of [facility] residence. Usually takes about a month to get in.     [facility] [phone]   Location: [address], [facility] Clinic Hours: Walk-in Urgent Care Dental Services are offered Monday-Friday mornings only. The numbers of emergencies accepted daily is limited to the number of providers available. Maximum 15 - Mondays, Wednesdays & Thursdays Maximum 10 - Tuesdays & Fridays Services: You do not need to be a [facility] resident to be seen for a dental emergency. Emergencies are defined as pain, swelling, abnormal bleeding, or dental trauma. Walkins will receive x-rays if needed. NOTE: Dental cleaning is not an emergency. Payment Options: PAYMENT IS DUE AT THE TIME OF SERVICE. Minimum co-pay is $40.00 for uninsured patients. Minimum co-pay is $3.00 for Medicaid with dental coverage. Dental Insurance is accepted and must be presented at time of visit. Medicare does not cover dental. Forms of payment: Cash, credit card, checks. Best way to get seen: If not previously registered  with the clinic, walk-in dental registration begins at 7:15 am and is on a first come/first serve basis. If previously registered with the clinic, call to make an appointment.     [facility] [phone] [place] RESIDENTS ONLY   Location: [address] [place], [place] Clinic Hours: Mon-Thu 10a-2p Services: Extractions only! Payment Options: FREE (donations accepted) - bring proof of income or support Best way to  get seen: Call and schedule an appointment OR come at 8am on the 1st Monday of every month (except for holidays) when it is first come/first served.     [facility] [phone]   Location: [address] [place], [place] Clinic Hours: Friday mornings Services, Payment Options, Best way to get seen: Call for info

## 2015-10-13 NOTE — ED Provider Notes (Signed)
[facility] Emergency Department Provider Note  ____________________________________________  Time seen: Approximately 6:38 PM  I have reviewed the triage vital signs and the nursing notes.   HISTORY  Chief Complaint Dental Pain    HPI Nathan Vasquez is a 35 y.o. male with 3 days of dental pain right upper dentition. Started swelling yesterday. Couldn't sleep last night. Thinks he has tasted some purulent drainage. History of dental caries. Hasn't been to a dentist in 15+ years.Has felt chilled at times. Taking occasional Tylenol. No ear pain, throat pain, chest pain or cough. Has had a very poor appetite.   Past Medical History  Diagnosis Date  . Asthma   . Hep C w/o coma, chronic (HCC)     There are no active problems to display for this patient.   History reviewed. No pertinent past surgical history.  Current Outpatient Rx  Name  Route  Sig  Dispense  Refill  . clindamycin (CLEOCIN) 300 MG capsule   Oral   Take 1 capsule (300 mg total) by mouth 3 (three) times daily.   30 capsule   0   . ibuprofen (ADVIL,MOTRIN) 800 MG tablet   Oral   Take 1 tablet (800 mg total) by mouth every 8 (eight) hours as needed.   15 tablet   0   . oxyCODONE-acetaminophen (ROXICET) 5-325 MG tablet   Oral   Take 1 tablet by mouth every 6 (six) hours as needed.   20 tablet   0   . sulfamethoxazole-trimethoprim (BACTRIM DS,SEPTRA DS) 800-160 MG tablet   Oral   Take 1 tablet by mouth 2 (two) times daily.   14 tablet   0   . traMADol (ULTRAM) 50 MG tablet   Oral   Take 1 tablet (50 mg total) by mouth every 6 (six) hours as needed for moderate pain.   20 tablet   0     Allergies Penicillins  No family history on file.  Social History Social History  Substance Use Topics  . Smoking status: Current Every Day Smoker  . Smokeless tobacco: None  . Alcohol Use: Yes    Review of Systems Constitutional: per HPI Eyes: No visual changes. ENT:  No sore throat. Cardiovascular: Denies chest pain. Respiratory: Denies shortness of breath. Gastrointestinal: per HPI Genitourinary: Negative for dysuria. Musculoskeletal: Negative for back pain. Skin: Negative for rash. Neurological: Negative for headaches, focal weakness or numbness. 10-point ROS otherwise negative.  ____________________________________________   PHYSICAL EXAM:  VITAL SIGNS: ED Triage Vitals  Enc Vitals Group     BP 10/13/15 1807 141/83 mmHg     Pulse Rate 10/13/15 1807 80     Resp 10/13/15 1807 16     Temp 10/13/15 1807 97.9 F (36.6 C)     Temp Source 10/13/15 1807 Oral     SpO2 10/13/15 1807 98 %     Weight 10/13/15 1807 200 lb (90.719 kg)     Height 10/13/15 1807 6' (1.829 m)     Head Cir --      Peak Flow --      Pain Score 10/13/15 1821 7     Pain Loc --      Pain Edu? --      Excl. in GC? --     Constitutional: Alert and oriented. Well appearing and in no acute distress. Eyes: Conjunctivae are normal. PERRL. EOMI. Ears:  Clear with normal landmarks. No erythema. Head: Atraumatic. Nose: No congestion/rhinnorhea. Mouth/Throat: Mucous membranes are  moist.  Oropharynx non-erythematous. No lesions.Visible swelling of the right cheek without induration. Swelling around the gum and buccal mucosa, right upper. No purulent drainage done. Tooth decay noted in certain areas, especially around the site of swelling and pain. Right upper anterior molar  Neck:  Supple.  No adenopathy.   Cardiovascular: Normal rate, regular rhythm. Grossly normal heart sounds.  Good peripheral circulation. Respiratory: Normal respiratory effort.  No retractions. Lungs CTAB. Gastrointestinal: Soft and nontender. No distention. No abdominal bruits. No CVA tenderness. Musculoskeletal: Nml ROM of upper and lower extremity joints. Neurologic:  Normal speech and language. No gross focal neurologic deficits are appreciated. No gait instability. Skin:  Skin is warm, dry and intact. No  rash noted. Psychiatric: Mood and affect are normal. Speech and behavior are normal.  ____________________________________________   LABS (all labs ordered are listed, but only abnormal results are displayed)  Labs Reviewed - No data to display ____________________________________________  EKG   ____________________________________________  RADIOLOGY   ____________________________________________   PROCEDURES  Procedure(s) performed: None  Critical Care performed: No  ____________________________________________   INITIAL IMPRESSION / ASSESSMENT AND PLAN / ED COURSE  Pertinent labs & imaging results that were available during my care of the patient were reviewed by me and considered in my medical decision making (see chart for details).  35 year old with dental pain 3 days and now increasing swelling to the right upper jaw. Unable to palpate a definite abscess. Started on clindamycin. Given 600 mg IM in the ER. He will start 300 mg 3 times a day. Also given Percocet for pain control. Given handouts on local dentist. Return to the emergency room for any worsening symptoms.  ____________________________________________   FINAL CLINICAL IMPRESSION(S) / ED DIAGNOSES  Final diagnoses:  Dental infection      Nathan BayleyRobert Stylianos Stradling, PA-C 10/13/15 1846  Myrna Blazeravid Matthew Schaevitz, MD 10/13/15 [phone]

## 2015-10-13 NOTE — ED Notes (Signed)
Reports toothache.  Swelling noted to right jaw.  No resp distress.

## 2016-06-08 ENCOUNTER — Encounter: Payer: Self-pay | Admitting: Emergency Medicine

## 2016-06-08 ENCOUNTER — Emergency Department: Payer: BLUE CROSS/BLUE SHIELD

## 2016-06-08 ENCOUNTER — Emergency Department
Admission: EM | Admit: 2016-06-08 | Discharge: 2016-06-08 | Disposition: A | Discharge status: Home / Self Care | Payer: BLUE CROSS/BLUE SHIELD | Attending: Emergency Medicine | Admitting: Emergency Medicine

## 2016-06-08 DIAGNOSIS — F17210 Nicotine dependence, cigarettes, uncomplicated: Secondary | ICD-10-CM | POA: Insufficient documentation

## 2016-06-08 DIAGNOSIS — J45909 Unspecified asthma, uncomplicated: Secondary | ICD-10-CM | POA: Insufficient documentation

## 2016-06-08 DIAGNOSIS — J40 Bronchitis, not specified as acute or chronic: Secondary | ICD-10-CM | POA: Insufficient documentation

## 2016-06-08 DIAGNOSIS — Z791 Long term (current) use of non-steroidal anti-inflammatories (NSAID): Secondary | ICD-10-CM | POA: Insufficient documentation

## 2016-06-08 DIAGNOSIS — R0789 Other chest pain: Secondary | ICD-10-CM

## 2016-06-08 DIAGNOSIS — Z79899 Other long term (current) drug therapy: Secondary | ICD-10-CM | POA: Insufficient documentation

## 2016-06-08 LAB — CBC
HEMATOCRIT: 43 % (ref 40.0–52.0)
Hemoglobin: 14.8 g/dL (ref 13.0–18.0)
MCH: 32.1 pg (ref 26.0–34.0)
MCHC: 34.3 g/dL (ref 32.0–36.0)
MCV: 93.4 fL (ref 80.0–100.0)
Platelets: 163 10*3/uL (ref 150–440)
RBC: 4.6 MIL/uL (ref 4.40–5.90)
RDW: 13.1 % (ref 11.5–14.5)
WBC: 10.9 10*3/uL — AB (ref 3.8–10.6)

## 2016-06-08 LAB — COMPREHENSIVE METABOLIC PANEL
ALT: 24 U/L (ref 17–63)
AST: 20 U/L (ref 15–41)
Albumin: 3.9 g/dL (ref 3.5–5.0)
Alkaline Phosphatase: 70 U/L (ref 38–126)
Anion gap: 6 (ref 5–15)
BILIRUBIN TOTAL: 0.8 mg/dL (ref 0.3–1.2)
BUN: 10 mg/dL (ref 6–20)
CHLORIDE: 105 mmol/L (ref 101–111)
CO2: 29 mmol/L (ref 22–32)
Calcium: 9 mg/dL (ref 8.9–10.3)
Creatinine, Ser: 0.86 mg/dL (ref 0.61–1.24)
Glucose, Bld: 96 mg/dL (ref 65–99)
POTASSIUM: 4.2 mmol/L (ref 3.5–5.1)
Sodium: 140 mmol/L (ref 135–145)
TOTAL PROTEIN: 7.5 g/dL (ref 6.5–8.1)

## 2016-06-08 LAB — INFLUENZA PANEL BY PCR (TYPE A & B)
Influenza A By PCR: NEGATIVE
Influenza B By PCR: NEGATIVE

## 2016-06-08 LAB — URINALYSIS, COMPLETE (UACMP) WITH MICROSCOPIC
BILIRUBIN URINE: NEGATIVE
Glucose, UA: NEGATIVE mg/dL
HGB URINE DIPSTICK: NEGATIVE
KETONES UR: NEGATIVE mg/dL
LEUKOCYTES UA: NEGATIVE
NITRITE: NEGATIVE
PH: 6 (ref 5.0–8.0)
Protein, ur: NEGATIVE mg/dL
Specific Gravity, Urine: 1.016 (ref 1.005–1.030)

## 2016-06-08 MED ORDER — NAPROXEN SODIUM 275 MG PO TABS: 275.0000 mg | ORAL_TABLET | Freq: Two times a day (BID) | ORAL | 2 refills | Status: AC

## 2016-06-08 MED ORDER — ONDANSETRON 4 MG PO TBDP: 8.0000 mg | ORAL_TABLET | Freq: Once | ORAL | Status: DC

## 2016-06-08 MED ORDER — IPRATROPIUM BROMIDE 0.02 % IN SOLN
0.5000 mg | Freq: Once | RESPIRATORY_TRACT | Status: AC
  Administered 2016-06-08: 0.5 mg via RESPIRATORY_TRACT
  Filled 2016-06-08: qty 2.5

## 2016-06-08 MED ORDER — AZITHROMYCIN 250 MG PO TABS: ORAL_TABLET | ORAL | 0 refills | Status: AC

## 2016-06-08 MED ORDER — ALBUTEROL SULFATE (2.5 MG/3ML) 0.083% IN NEBU
5.0000 mg | INHALATION_SOLUTION | Freq: Once | RESPIRATORY_TRACT | Status: AC
  Administered 2016-06-08: 5 mg via RESPIRATORY_TRACT
  Filled 2016-06-08: qty 6

## 2016-06-08 MED ORDER — ALBUTEROL SULFATE (2.5 MG/3ML) 0.083% IN NEBU: 2.5000 mg | INHALATION_SOLUTION | Freq: Four times a day (QID) | RESPIRATORY_TRACT | 12 refills | Status: DC | PRN

## 2016-06-08 MED ORDER — PREDNISONE 20 MG PO TABS: 60.0000 mg | ORAL_TABLET | Freq: Every day | ORAL | 0 refills | Status: DC

## 2016-06-08 MED ORDER — OXYCODONE-ACETAMINOPHEN 5-325 MG PO TABS
1.0000 | ORAL_TABLET | Freq: Once | ORAL | Status: AC
  Administered 2016-06-08: 1 via ORAL
  Filled 2016-06-08: qty 1

## 2016-06-08 MED ORDER — AZITHROMYCIN 500 MG PO TABS
500.0000 mg | ORAL_TABLET | Freq: Once | ORAL | Status: AC
  Administered 2016-06-08: 500 mg via ORAL
  Filled 2016-06-08: qty 1

## 2016-06-08 MED ORDER — IPRATROPIUM-ALBUTEROL 18-103 MCG/ACT IN AERO: 2.0000 | INHALATION_SPRAY | Freq: Four times a day (QID) | RESPIRATORY_TRACT | 2 refills | Status: DC | PRN

## 2016-06-08 MED ORDER — PREDNISONE 20 MG PO TABS
60.0000 mg | ORAL_TABLET | Freq: Once | ORAL | Status: AC
Start: 2016-06-08 — End: 2016-06-08
  Administered 2016-06-08: 60 mg via ORAL
  Filled 2016-06-08: qty 3

## 2016-06-08 NOTE — ED Provider Notes (Addendum)
[facility] Emergency Department Provider Note  ____________________________________________   I have reviewed the triage vital signs and the nursing notes.   HISTORY  Chief Complaint Fever and Nasal Congestion    HPI Nathan Vasquez is a 36 y.o. male  who presents today complaining of runny nose and cough. Patient has a history of significant smoking history and asthma. He smokes a pack a day for 20 years. Patient has had a productive cough with green mucus, when he coughs. He states he coughed really hard and had pain in his left side. That process. Worse when he changes position or coughs. Ran out of his albuterol.     Past Medical History:  Diagnosis Date  . Asthma   . Hep C w/o coma, chronic (HCC)     There are no active problems to display for this patient.   History reviewed. No pertinent surgical history.  Prior to Admission medications   Medication Sig Start Date End Date Taking? Authorizing Provider  clindamycin (CLEOCIN) 300 MG capsule Take 1 capsule (300 mg total) by mouth 3 (three) times daily. 10/13/15   Ignacia Bayley, PA-C  ibuprofen (ADVIL,MOTRIN) 800 MG tablet Take 1 tablet (800 mg total) by mouth every 8 (eight) hours as needed. 10/13/15   Ignacia Bayley, PA-C  oxyCODONE-acetaminophen (ROXICET) 5-325 MG tablet Take 1 tablet by mouth every 6 (six) hours as needed. 10/13/15   Ignacia Bayley, PA-C  sulfamethoxazole-trimethoprim (BACTRIM DS,SEPTRA DS) 800-160 MG tablet Take 1 tablet by mouth 2 (two) times daily. 04/11/15   Delorise Royals Cuthriell, PA-C  traMADol (ULTRAM) 50 MG tablet Take 1 tablet (50 mg total) by mouth every 6 (six) hours as needed for moderate pain. 01/02/15   Tommi Rumps, PA-C    Allergies Penicillins  No family history on file.  Social History Social History  Substance Use Topics  . Smoking status: Current Every Day Smoker    Packs/day: 0.50    Types: Cigarettes  . Smokeless tobacco: Never Used  . Alcohol  use Yes    Review of Systems Constitutional: No fever/chills Eyes: No visual changes. ENT: No sore throat. No stiff neck no neck pain Cardiovascular: Denies chest pain.Less coughing Respiratory: Positive green sputum positive cough Gastrointestinal:   no vomiting.  No diarrhea.  No constipation. Genitourinary: Negative for dysuria. Musculoskeletal: Negative lower extremity swelling Skin: Negative for rash. Neurological: Negative for severe headaches, focal weakness or numbness. 10-point ROS otherwise negative.  ____________________________________________   PHYSICAL EXAM:  VITAL SIGNS: ED Triage Vitals  Enc Vitals Group     BP 06/08/16 1023 (!) 124/92     Pulse Rate 06/08/16 1023 (!) 116     Resp 06/08/16 1023 18     Temp 06/08/16 1023 98.4 F (36.9 C)     Temp Source 06/08/16 1023 Oral     SpO2 06/08/16 1023 99 %     Weight 06/08/16 1024 200 lb (90.7 kg)     Height 06/08/16 1024 6' (1.829 m)     Head Circumference --      Peak Flow --      Pain Score 06/08/16 1023 8     Pain Loc --      Pain Edu? --      Excl. in GC? --     Constitutional: Alert and oriented. Well appearing and in no acute distress. Eyes: Conjunctivae are normal. PERRL. EOMI. Head: Atraumatic. Nose: No congestion/rhinnorhea. Mouth/Throat: Mucous membranes are moist.  Oropharynx non-erythematous. Neck: No  stridor.   Nontender with no meningismus Cardiovascular: Normal rate, regular rhythm. Grossly normal heart sounds.   Chest: Tender to palpation left chest wall metastasis area patient says "ouch that's the pain right there, no flail chest no rib fracture, no crepitus no other obvious injury noted. Marland KitchenRespiratory: Normal respiratory effort.  No retractions. Positive bilateral wheeze no rhonchi Abdominal: Soft and nontender. No distention. No guarding no rebound Back:  There is no focal tenderness or step off.  there is no midline tenderness there are no lesions noted. there is no CVA  tenderness Musculoskeletal: No lower extremity tenderness, no upper extremity tenderness. No joint effusions, no DVT signs strong distal pulses no edema Neurologic:  Normal speech and language. No gross focal neurologic deficits are appreciated.  Skin:  Skin is warm, dry and intact. No rash noted. Psychiatric: Mood and affect are normal. Speech and behavior are normal.  ____________________________________________   LABS (all labs ordered are listed, but only abnormal results are displayed)  Labs Reviewed  CBC - Abnormal; Notable for the following:       Result Value   WBC 10.9 (*)    All other components within normal limits  URINALYSIS, COMPLETE (UACMP) WITH MICROSCOPIC - Abnormal; Notable for the following:    Color, Urine YELLOW (*)    APPearance CLEAR (*)    Bacteria, UA RARE (*)    Squamous Epithelial / LPF 0-5 (*)    All other components within normal limits  COMPREHENSIVE METABOLIC PANEL  INFLUENZA PANEL BY PCR (TYPE A & B, H1N1)   ____________________________________________  EKG  I personally interpreted any EKGs ordered by me or triage Normal sinus tachycardia rate 114 normal axis, no acute ST elevation or depression ____________________________________________  RADIOLOGY  I reviewed any imaging ordered by me or triage that were performed during my shift and, if possible, patient and/or family made aware of any abnormal findings. ____________________________________________   PROCEDURES  Procedure(s) performed: None  Procedures  Critical Care performed: None  ____________________________________________   INITIAL IMPRESSION / ASSESSMENT AND PLAN / ED COURSE  Pertinent labs & imaging results that were available during my care of the patient were reviewed by me and considered in my medical decision making (see chart for details).  Patient with cough and wheeze in the context of smoking. Blood work is reassuring chest x-ray is reassuring, it does not  have the flu. I will treat him empirically with antibiotics. After breathing treatment his lungs are clear, he is ambulating in the department. I do not think is reproducible chest wall pain represents ACS PE or dissection. CAt this time, there does not appear to be clinical evidence to support the diagnosis of pulmonary embolus, dissection, myocarditis, endocarditis, pericarditis, pericardial tamponade, acute coronary syndrome, pneumothorax, pneumonia, or any other acute intrathoracic pathology that will require admission or acute intervention. Nor is there evidence of any significant intra-abdominal pathology causing this discomfort. Return precautions and follow-up given and understood because of his extensive smoking history and COPD, which I believe he has, we will start him on azithromycin. She does have a nebulizer machine at home he is requesting a refill for that we will give him that as well as steroids. Extensive smoking counseling provided.  Clinical Course    ____________________________________________   FINAL CLINICAL IMPRESSION(S) / ED DIAGNOSES  Final diagnoses:  None      This chart was dictated using voice recognition software.  Despite best efforts to proofread,  errors can occur which can change meaning.  Jeanmarie PlantJames A Keiron Iodice, MD 06/08/16 1224    Jeanmarie PlantJames A Jonathandavid Marlett, MD 06/08/16 1227    Jeanmarie PlantJames A Ambry Dix, MD 06/08/16 [phone]

## 2016-06-08 NOTE — ED Triage Notes (Signed)
Patient presents to the ED with fever, nausea, vomiting, and diarrhea x 1 week.  Patient reports vomiting x 4 in the past 24 hours.  Patient reports having rib pain from amount of coughing and feeling short of breath.  Patient is speaking in full sentences at this time without obvious difficulty.

## 2016-06-17 ENCOUNTER — Emergency Department
Admission: EM | Admit: 2016-06-17 | Discharge: 2016-06-17 | Disposition: A | Discharge status: Home / Self Care | Payer: Self-pay | Attending: Emergency Medicine | Admitting: Emergency Medicine

## 2016-06-17 ENCOUNTER — Encounter: Payer: Self-pay | Admitting: Emergency Medicine

## 2016-06-17 ENCOUNTER — Emergency Department: Payer: Self-pay

## 2016-06-17 DIAGNOSIS — J441 Chronic obstructive pulmonary disease with (acute) exacerbation: Secondary | ICD-10-CM | POA: Insufficient documentation

## 2016-06-17 DIAGNOSIS — J45909 Unspecified asthma, uncomplicated: Secondary | ICD-10-CM | POA: Insufficient documentation

## 2016-06-17 DIAGNOSIS — F17210 Nicotine dependence, cigarettes, uncomplicated: Secondary | ICD-10-CM | POA: Insufficient documentation

## 2016-06-17 LAB — INFLUENZA PANEL BY PCR (TYPE A & B)
Influenza A By PCR: NEGATIVE
Influenza B By PCR: NEGATIVE

## 2016-06-17 MED ORDER — PREDNISONE 20 MG PO TABS
60.0000 mg | ORAL_TABLET | Freq: Once | ORAL | Status: AC
  Administered 2016-06-17: 60 mg via ORAL
  Filled 2016-06-17: qty 3

## 2016-06-17 MED ORDER — PREDNISONE 20 MG PO TABS: 60.0000 mg | ORAL_TABLET | Freq: Every day | ORAL | 0 refills | Status: AC

## 2016-06-17 MED ORDER — LEVOFLOXACIN 750 MG PO TABS
750.0000 mg | ORAL_TABLET | Freq: Once | ORAL | Status: AC
  Administered 2016-06-17: 750 mg via ORAL
  Filled 2016-06-17: qty 1

## 2016-06-17 MED ORDER — LEVOFLOXACIN 750 MG PO TABS: 750.0000 mg | ORAL_TABLET | Freq: Every day | ORAL | 0 refills | Status: AC

## 2016-06-17 MED ORDER — KETOROLAC TROMETHAMINE 60 MG/2ML IM SOLN
30.0000 mg | Freq: Once | INTRAMUSCULAR | Status: AC
  Administered 2016-06-17: 30 mg via INTRAMUSCULAR
  Filled 2016-06-17: qty 2

## 2016-06-17 MED ORDER — IPRATROPIUM-ALBUTEROL 0.5-2.5 (3) MG/3ML IN SOLN
RESPIRATORY_TRACT | Status: AC
  Filled 2016-06-17: qty 9

## 2016-06-17 MED ORDER — IPRATROPIUM-ALBUTEROL 0.5-2.5 (3) MG/3ML IN SOLN
9.0000 mL | Freq: Once | RESPIRATORY_TRACT | Status: AC
  Administered 2016-06-17: 9 mL via RESPIRATORY_TRACT
  Filled 2016-06-17: qty 9

## 2016-06-17 MED ORDER — ACETAMINOPHEN 325 MG PO TABS
650.0000 mg | ORAL_TABLET | Freq: Once | ORAL | Status: AC
  Administered 2016-06-17: 650 mg via ORAL
  Filled 2016-06-17: qty 2

## 2016-06-17 NOTE — ED Provider Notes (Signed)
[facility] Emergency Department Provider Note  ____________________________________________   First MD Initiated Contact with Patient 06/17/16 1623     (approximate)  I have reviewed the triage vital signs and the nursing notes.   HISTORY  Chief Complaint Fever and Cough   HPI Nathan Vasquez is a 36 y.o. male with a history of COPD who is still smoking who is presenting to the emergency department with 2 weeks of cough and fever. He says that he was seen in the emergency department about a week ago and was given a four-day course of steroids. He says that after finishing the steroids this past Tuesday the illness started to worsen this Wednesday. He says that the cough returned along with chest pain bilaterally when he coughs sharp. He says that he also has pressures percent the left side of his face and his left ear. He says that he completed the Z-Pak as well as steroids.   Past Medical History:  Diagnosis Date  . Asthma   . Hep C w/o coma, chronic (HCC)     There are no active problems to display for this patient.   History reviewed. No pertinent surgical history.  Prior to Admission medications   Medication Sig Start Date End Date Taking? Authorizing Provider  albuterol (PROVENTIL) (2.5 MG/3ML) 0.083% nebulizer solution Take 3 mLs (2.5 mg total) by nebulization every 6 (six) hours as needed for wheezing or shortness of breath. 06/08/16   Jeanmarie Plant, MD  albuterol-ipratropium (COMBIVENT) 18-103 MCG/ACT inhaler Inhale 2 puffs into the lungs 4 (four) times daily as needed for wheezing. 06/08/16 06/08/17  Jeanmarie Plant, MD  clindamycin (CLEOCIN) 300 MG capsule Take 1 capsule (300 mg total) by mouth 3 (three) times daily. 10/13/15   Ignacia Bayley, PA-C  ibuprofen (ADVIL,MOTRIN) 800 MG tablet Take 1 tablet (800 mg total) by mouth every 8 (eight) hours as needed. 10/13/15   Ignacia Bayley, PA-C  naproxen sodium (ANAPROX) 275 MG tablet Take 1 tablet  (275 mg total) by mouth 2 (two) times daily with a meal. 06/08/16 06/08/17  Jeanmarie Plant, MD  oxyCODONE-acetaminophen (ROXICET) 5-325 MG tablet Take 1 tablet by mouth every 6 (six) hours as needed. 10/13/15   Ignacia Bayley, PA-C  predniSONE (DELTASONE) 20 MG tablet Take 3 tablets (60 mg total) by mouth daily. 06/08/16   Jeanmarie Plant, MD  sulfamethoxazole-trimethoprim (BACTRIM DS,SEPTRA DS) 800-160 MG tablet Take 1 tablet by mouth 2 (two) times daily. 04/11/15   Delorise Royals Cuthriell, PA-C  traMADol (ULTRAM) 50 MG tablet Take 1 tablet (50 mg total) by mouth every 6 (six) hours as needed for moderate pain. 01/02/15   Tommi Rumps, PA-C    Allergies Penicillins  No family history on file.  Social History Social History  Substance Use Topics  . Smoking status: Current Every Day Smoker    Packs/day: 0.50    Types: Cigarettes  . Smokeless tobacco: Never Used  . Alcohol use Yes    Review of Systems Constitutional: as above Eyes: No visual changes. ENT: No sore throat. Cardiovascular: As above Respiratory: As above  Gastrointestinal: No abdominal pain.  No nausea, no vomiting.   Genitourinary: Negative for dysuria. Musculoskeletal: Negative for back pain. Skin: Negative for rash. Neurological: Negative for headaches, focal weakness or numbness.  10-point ROS otherwise negative.  ____________________________________________   PHYSICAL EXAM:  VITAL SIGNS: ED Triage Vitals  Enc Vitals Group     BP 06/17/16 1527 (!) 148/96  Pulse Rate 06/17/16 1527 86     Resp 06/17/16 1527 16     Temp 06/17/16 1527 97.5 F (36.4 C)     Temp src --      SpO2 06/17/16 1527 98 %     Weight 06/17/16 1528 200 lb (90.7 kg)     Height 06/17/16 1528 6' (1.829 m)     Head Circumference --      Peak Flow --      Pain Score 06/17/16 1529 5     Pain Loc --      Pain Edu? --      Excl. in GC? --     Constitutional: Alert and oriented. Well appearing and in no acute distress. Eyes:  Conjunctivae are normal. PERRL. EOMI. Head: Atraumatic.Left TM with fluid behind it. No bulging or erythema to the bilateral TMs. Nose: No congestion/rhinnorhea. Mouth/Throat: Mucous membranes are moist.  Oropharynx non-erythematous. Neck: No stridor.   Cardiovascular: Normal rate, regular rhythm. Grossly normal heart sounds.  Chest pain is reproducible, diffusely, to palpation. Respiratory: Normal respiratory effort.  No retractions. Wheezing throughout with a prolonged x-ray phase. Gastrointestinal: Soft and nontender. No distention. No abdominal bruits. No CVA tenderness. Musculoskeletal: No lower extremity tenderness nor edema.  No joint effusions. Neurologic:  Normal speech and language. No gross focal neurologic deficits are appreciated.  Skin:  Skin is warm, dry and intact. No rash noted. Psychiatric: Mood and affect are normal. Speech and behavior are normal.  ____________________________________________   LABS (all labs ordered are listed, but only abnormal results are displayed)  Labs Reviewed  INFLUENZA PANEL BY PCR (TYPE A & B)   ____________________________________________  EKG  ED ECG REPORT I, Schaevitz,  Teena Iraniavid M, the attending physician, personally viewed and interpreted this ECG.   Date: 06/17/2016  EKG Time: 1825  Rate: 97  Rhythm: normal sinus rhythm  Axis: Rightward axis  Intervals:none  ST&T Change: No ST segment elevation or depression. No abnormal T-wave inversion.  ____________________________________________  RADIOLOGY  DG Chest 2 View (Final result)  Result time 06/17/16 16:48:56  Final result by Carey BullocksWilliam Veazey, MD (06/17/16 16:48:56)           Narrative:   CLINICAL DATA: Cough for 3 weeks. Fever last night with generalized rib pain with coughing.  EXAM: CHEST 2 VIEW  COMPARISON: Radiographs 06/08/2016. CT 09/05/2014.  FINDINGS: The heart size and mediastinal contours are stable. The lungs are mildly hyperinflated but clear.  There is mild chronic central airway thickening. No pleural effusion or pneumothorax. Mild thoracic spine degenerative changes are stable.  IMPRESSION: Stable radiographic appearance of the chest with central airway thickening suggesting chronic bronchitis. No acute findings demonstrated.   Electronically Signed By: Carey BullocksWilliam Veazey M.D. On: 06/17/2016 16:48            ____________________________________________   PROCEDURES  Procedure(s) performed:   Procedures  Critical Care performed:   ____________________________________________   INITIAL IMPRESSION / ASSESSMENT AND PLAN / ED COURSE  Pertinent labs & imaging results that were available during my care of the patient were reviewed by me and considered in my medical decision making (see chart for details).  ----------------------------------------- 7:26 PM on 06/17/2016 -----------------------------------------  Patient feeling improved at this time. Still wheezing but the wheezes are scant. No prolonged x-ray phase. He'll be discharged with prednisone and Levaquin. He says that he has breathing treatments at home which she will use as needed.     ____________________________________________   FINAL CLINICAL IMPRESSION(S) / ED DIAGNOSES  COPD exacerbation.    NEW MEDICATIONS STARTED DURING THIS VISIT:  New Prescriptions   No medications on file     Note:  This document was prepared using Dragon voice recognition software and may include unintentional dictation errors.    Myrna Blazer, MD 06/17/16 [phone]

## 2016-06-17 NOTE — ED Triage Notes (Signed)
Seen through ED last Saturday, diagnosed with bronchitis and steroids.  Started feeling bad again Wednesday and has been running fevers.  Tylenol last taken at 1100.

## 2016-08-03 ENCOUNTER — Encounter: Payer: Self-pay | Admitting: *Deleted

## 2016-08-03 ENCOUNTER — Emergency Department: Payer: Self-pay

## 2016-08-03 ENCOUNTER — Emergency Department
Admission: EM | Admit: 2016-08-03 | Discharge: 2016-08-03 | Discharge status: Against Medical Advice | Payer: Self-pay | Attending: Emergency Medicine | Admitting: Emergency Medicine

## 2016-08-03 DIAGNOSIS — T401X1A Poisoning by heroin, accidental (unintentional), initial encounter: Secondary | ICD-10-CM | POA: Insufficient documentation

## 2016-08-03 DIAGNOSIS — J45909 Unspecified asthma, uncomplicated: Secondary | ICD-10-CM | POA: Insufficient documentation

## 2016-08-03 DIAGNOSIS — Z79899 Other long term (current) drug therapy: Secondary | ICD-10-CM | POA: Insufficient documentation

## 2016-08-03 DIAGNOSIS — F17210 Nicotine dependence, cigarettes, uncomplicated: Secondary | ICD-10-CM | POA: Insufficient documentation

## 2016-08-03 LAB — CBC WITH DIFFERENTIAL/PLATELET
BASOS ABS: 0 10*3/uL (ref 0–0.1)
Basophils Relative: 0 %
EOS ABS: 0.3 10*3/uL (ref 0–0.7)
Eosinophils Relative: 2 %
HCT: 41.6 % (ref 40.0–52.0)
HEMOGLOBIN: 13.6 g/dL (ref 13.0–18.0)
Lymphocytes Relative: 16 %
Lymphs Abs: 1.9 10*3/uL (ref 1.0–3.6)
MCH: 31 pg (ref 26.0–34.0)
MCHC: 32.7 g/dL (ref 32.0–36.0)
MCV: 94.7 fL (ref 80.0–100.0)
MONOS PCT: 4 %
Monocytes Absolute: 0.5 10*3/uL (ref 0.2–1.0)
NEUTROS ABS: 8.8 10*3/uL — AB (ref 1.4–6.5)
NEUTROS PCT: 78 %
Platelets: 174 10*3/uL (ref 150–440)
RBC: 4.39 MIL/uL — ABNORMAL LOW (ref 4.40–5.90)
RDW: 13.8 % (ref 11.5–14.5)
WBC: 11.5 10*3/uL — ABNORMAL HIGH (ref 3.8–10.6)

## 2016-08-03 MED ORDER — IPRATROPIUM-ALBUTEROL 0.5-2.5 (3) MG/3ML IN SOLN
3.0000 mL | Freq: Once | RESPIRATORY_TRACT | Status: AC
  Administered 2016-08-03: 3 mL via RESPIRATORY_TRACT
  Filled 2016-08-03: qty 3
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
## 2016-08-03 NOTE — ED Notes (Addendum)
Pt states he is leaving and wants to sign out AMA - he will not wait on results or MD - discussed with pt the benefits of staying for continued evaluation and treatment - pt states that he is tired and is going home - he states that this hospital "isn't worth a shit" and that he needs nothing else from this hospital - pt did sign AMA paperwork but refused to sign AMA form online

## 2016-08-03 NOTE — ED Notes (Signed)
Lab called to run blood work orders

## 2016-08-03 NOTE — ED Notes (Signed)
Pt agreed to nebulizer but states he is not having anymore blood work or IV's and that he is going home after breathing treatment - pt is very hostile and belligerent towards staff - Dr Shaune PollackLord is aware
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
## 2016-08-04 DIAGNOSIS — R5381 Other malaise: Secondary | ICD-10-CM | POA: Insufficient documentation

## 2016-08-04 DIAGNOSIS — J4520 Mild intermittent asthma, uncomplicated: Secondary | ICD-10-CM | POA: Insufficient documentation

## 2016-08-04 DIAGNOSIS — T40601A Poisoning by unspecified narcotics, accidental (unintentional), initial encounter: Secondary | ICD-10-CM | POA: Insufficient documentation

## 2016-12-30 ENCOUNTER — Emergency Department
Admission: EM | Admit: 2016-12-30 | Discharge: 2016-12-30 | Disposition: A | Discharge status: Home / Self Care | Payer: BLUE CROSS/BLUE SHIELD | Attending: Emergency Medicine | Admitting: Emergency Medicine

## 2016-12-30 ENCOUNTER — Encounter: Payer: Self-pay | Admitting: Medical Oncology

## 2016-12-30 DIAGNOSIS — F17210 Nicotine dependence, cigarettes, uncomplicated: Secondary | ICD-10-CM | POA: Insufficient documentation

## 2016-12-30 DIAGNOSIS — J45909 Unspecified asthma, uncomplicated: Secondary | ICD-10-CM | POA: Insufficient documentation

## 2016-12-30 DIAGNOSIS — K029 Dental caries, unspecified: Secondary | ICD-10-CM | POA: Insufficient documentation

## 2016-12-30 DIAGNOSIS — Z79899 Other long term (current) drug therapy: Secondary | ICD-10-CM | POA: Insufficient documentation

## 2016-12-30 DIAGNOSIS — J00 Acute nasopharyngitis [common cold]: Secondary | ICD-10-CM | POA: Insufficient documentation

## 2016-12-30 MED ORDER — FLUTICASONE PROPIONATE 50 MCG/ACT NA SUSP: 2.0000 | Freq: Every day | NASAL | 0 refills | Status: DC

## 2016-12-30 MED ORDER — CLINDAMYCIN HCL 150 MG PO CAPS: 300.0000 mg | ORAL_CAPSULE | Freq: Three times a day (TID) | ORAL | 0 refills | Status: AC

## 2016-12-30 NOTE — Discharge Instructions (Signed)
You may be experiencing nasal allergies. You should take the prescription antibiotic as directed. Follow-up with one of the local dental clinics for dental extraction.   OPTIONS FOR DENTAL FOLLOW UP CARE  [place] Department of Health and Human Services - Local Safety Net Dental Clinics [email or web address]://www.ncdhhs.gov/dph/oralhealth/services/safetynetclinics.htm   [facility] [phone])  [facility] [phone])  [facility] [phone])  [facility] [phone])  [facility] [phone]) This clinic caters to the indigent population and is on a lottery system. Location: [employer] [address] [facility], [address], [place] [facility] Hours: Wednesdays from 6pm - 9pm, patients seen by a lottery system. For dates, call or go to [email or web address] Services: Cleanings, fillings and simple extractions. Payment Options: DENTAL WORK IS FREE OF CHARGE. Bring proof of income or support. Best way to get seen: Arrive at 5:15 pm - this is a lottery, NOT first come/first serve, so arriving earlier will not increase your chances of being seen.     [facility] [phone] Select option 1 for emergencies   Location: [facility], [address], [place] [facility] Hours: No walk-ins accepted - call the day before to schedule an appointment. Check in times are 9:30 am and 1:30 pm. Services: Simple extractions, temporary fillings, pulpectomy/pulp debridement, uncomplicated abscess drainage. Payment Options: PAYMENT IS DUE AT THE TIME OF SERVICE.  Fee is usually $100-200, additional surgical procedures (e.g. abscess drainage) may be extra. Cash, checks, Visa/MasterCard accepted.  Can file Medicaid if patient is covered for dental - patient should call case worker to check. No discount for [facility] patients. Best way to get  seen: MUST call the day before and get onto the schedule. Can usually be seen the next 1-2 days. No walk-ins accepted.     [facility] [phone]   Location: [facility], [address], [place] [facility] Hours: M, W, Th, F 8am or 1:30pm, Tues 9a or 1:30 - first come/first served. Services: Simple extractions, temporary fillings, uncomplicated abscess drainage.  You do not need to be an [place] resident. Payment Options: PAYMENT IS DUE AT THE TIME OF SERVICE. Dental insurance, otherwise sliding scale - bring proof of income or support. Depending on income and treatment needed, cost is usually $50-200. Best way to get seen: Arrive early as it is first come/first served.     [facility] [phone]   Location: [address] Clinic Hours: Mon-Thu 8a-5p Services: Most basic dental services including extractions and fillings. Payment Options: PAYMENT IS DUE AT THE TIME OF SERVICE. Sliding scale, up to 50% off - bring proof if income or support. Medicaid with dental option accepted. Best way to get seen: Call to schedule an appointment, can usually be seen within 2 weeks OR they will try to see walk-ins - show up at 8a or 2p (you may have to wait).     [facility] [phone] [place] RESIDENTS ONLY   Location: [facility], [address], [place], [place] [address] Clinic Hours: By appointment only. Monday - Thursday 8am-5pm, Friday 8am-12pm Services: Cleanings, fillings, extractions. Payment Options: PAYMENT IS DUE AT THE TIME OF SERVICE. Cash, Visa or MasterCard. Sliding scale - $30 minimum per service. Best way to get seen: Come in to office, complete packet and make an appointment - need proof of income or support monies for each household member and proof of  [facility] residence. Usually takes about a month to get in.     [facility] [phone]   Location: [address], [facility] Clinic Hours: Walk-in Urgent Care Dental Services are offered Monday-Friday mornings only. The numbers of emergencies accepted daily is limited to the number of providers available. Maximum 15 - Mondays, Wednesdays & Thursdays Maximum 10 - Tuesdays & Fridays Services: You do not need to be a [facility] resident to be seen for a dental emergency. Emergencies are defined as pain, swelling, abnormal bleeding, or dental trauma. Walkins will receive x-rays if needed. NOTE: Dental cleaning is not an emergency. Payment Options: PAYMENT IS DUE AT THE TIME OF SERVICE. Minimum co-pay is $40.00 for uninsured patients. Minimum co-pay is $3.00 for Medicaid with dental coverage. Dental Insurance is accepted and must be presented at time of visit. Medicare does not cover dental. Forms of payment: Cash, credit card, checks. Best way to get seen: If not previously registered with the clinic, walk-in dental registration begins at 7:15 am and is on a first come/first serve basis. If previously registered with the clinic, call to make an appointment.     [facility] [phone] [place] RESIDENTS ONLY   Location: [address], [place], [place] Clinic Hours: Mon-Thu 10a-2p Services: Extractions only! Payment Options: FREE (donations accepted) - bring proof of income or support Best way to get seen: Call and schedule an appointment OR come at 8am on the 1st Monday of every month (except for holidays) when it is first come/first served.     [facility] [phone]   Location: [address] [place], [place] Clinic Hours: Friday mornings Services, Payment Options, Best way to get seen: Call for info

## 2016-12-30 NOTE — ED Notes (Signed)
States a broken tooth upper right and cold symptoms, sore throat and nasal congestion, awake and alert in no acute distress

## 2016-12-30 NOTE — ED Triage Notes (Signed)
Sore throat for a couple of days and dental pain for about 1 week.

## 2016-12-31 NOTE — ED Provider Notes (Signed)
[facility] Emergency Department Provider Note ____________________________________________  Time seen: 23  I have reviewed the triage vital signs and the nursing notes.  HISTORY  Chief Complaint  Dental Pain and Sore Throat  HPI Nathan Vasquez is a 36 y.o. male presents to the ED for evaluation of a 2 day complaint of sore throat as well as a one-week complaint abdominal pain. The patient admits to poor dentition and notes pain to the upper right molar. He denies any fevers, chills, sweats. Denies any spontaneous drainage or difficulty controlling oral secretions.He does report several days of sinus congestion and postnasal drainage. He denies any sick contacts, recent travel, or other exposures.  Past Medical History:  Diagnosis Date  . Asthma   . Hep C w/o coma, chronic (HCC)     There are no active problems to display for this patient.  History reviewed. No pertinent surgical history.  Prior to Admission medications   Medication Sig Start Date End Date Taking? Authorizing Provider  albuterol (PROVENTIL) (2.5 MG/3ML) 0.083% nebulizer solution Take 3 mLs (2.5 mg total) by nebulization every 6 (six) hours as needed for wheezing or shortness of breath. 06/08/16   Jeanmarie Plant, MD  albuterol-ipratropium (COMBIVENT) 18-103 MCG/ACT inhaler Inhale 2 puffs into the lungs 4 (four) times daily as needed for wheezing. 06/08/16 06/08/17  Jeanmarie Plant, MD  clindamycin (CLEOCIN) 150 MG capsule Take 2 capsules (300 mg total) by mouth 3 (three) times daily. 12/30/16 01/06/17  Elea Holtzclaw, Charlesetta Ivory, PA-C  fluticasone (FLONASE) 50 MCG/ACT nasal spray Place 2 sprays into both nostrils daily. 12/30/16   Orelia Brandstetter, Charlesetta Ivory, PA-C  ibuprofen (ADVIL,MOTRIN) 800 MG tablet Take 1 tablet (800 mg total) by mouth every 8 (eight) hours as needed. 10/13/15   Ignacia Bayley, PA-C  naproxen sodium (ANAPROX) 275 MG tablet Take 1 tablet (275 mg total) by mouth 2 (two) times  daily with a meal. 06/08/16 06/08/17  Jeanmarie Plant, MD  oxyCODONE-acetaminophen (ROXICET) 5-325 MG tablet Take 1 tablet by mouth every 6 (six) hours as needed. 10/13/15   Ignacia Bayley, PA-C  predniSONE (DELTASONE) 20 MG tablet Take 3 tablets (60 mg total) by mouth daily. 06/17/16 06/17/17  Myrna Blazer, MD  sulfamethoxazole-trimethoprim (BACTRIM DS,SEPTRA DS) 800-160 MG tablet Take 1 tablet by mouth 2 (two) times daily. 04/11/15   Cuthriell, Delorise Royals, PA-C  traMADol (ULTRAM) 50 MG tablet Take 1 tablet (50 mg total) by mouth every 6 (six) hours as needed for moderate pain. 01/02/15   Tommi Rumps, PA-C    Allergies Penicillins  No family history on file.  Social History Social History  Substance Use Topics  . Smoking status: Current Every Day Smoker    Packs/day: 0.50    Types: Cigarettes  . Smokeless tobacco: Never Used  . Alcohol use Yes    Review of Systems  Constitutional: Negative for fever. Eyes: Negative for visual changes. ENT: Positive for sore throat. Dental pain as above. Cardiovascular: Negative for chest pain. Respiratory: Negative for shortness of breath. Gastrointestinal: Negative for abdominal pain, vomiting and diarrhea. Neurological: Negative for headaches, focal weakness or numbness. ____________________________________________  PHYSICAL EXAM:  VITAL SIGNS: ED Triage Vitals  Enc Vitals Group     BP 12/30/16 1702 130/87     Pulse Rate 12/30/16 1702 91     Resp 12/30/16 1702 14     Temp 12/30/16 1702 98.5 F (36.9 C)     Temp Source 12/30/16 1702 Oral  SpO2 12/30/16 1702 95 %     Weight 12/30/16 1645 200 lb (90.7 kg)     Height 12/30/16 1703 6' (1.829 m)     Head Circumference --      Peak Flow --      Pain Score 12/30/16 1645 8     Pain Loc --      Pain Edu? --      Excl. in GC? --     Constitutional: Alert and oriented. Well appearing and in no distress. Head: Normocephalic and atraumatic. Eyes: Conjunctivae are normal.  PERRL. Normal extraocular movements Ears: Canals clear. TMs intact bilaterally. Nose: No congestion/rhinorrhea/epistaxis. Mouth/Throat: Mucous membranes are moist. Uvula is midline and tonsils are flat. No oropharyngeal lesions are appreciated. No sublingual brawny erythema or swelling is noted. Patient with multiple cavities noted throughout. He is a chronically broken upper right molar. No local swelling, fluctuance, or pointing is appreciated. Neck: Supple. No thyromegaly. Hematological/Lymphatic/Immunological: No cervical lymphadenopathy. Cardiovascular: Normal rate, regular rhythm. Normal distal pulses. Respiratory: Normal respiratory effort. No wheezes/rales/rhonchi. ____________________________________________  INITIAL IMPRESSION / ASSESSMENT AND PLAN / ED COURSE  Patient with ED evaluation of dental pain secondary to caries. He also has a probable allergic rhinitis on presentation. The patient is offered a dental block but refuses at this time. He'll be discharged with a protection for clindamycin and Flonase, as well as referral to one of the many local dental clinics for definitive management. Return precautions are reviewed. ____________________________________________  FINAL CLINICAL IMPRESSION(S) / ED DIAGNOSES  Final diagnoses:  Pain due to dental caries  Acute rhinitis      Karmen StabsMenshew, Charlesetta IvoryJenise V Bacon, PA-C 12/31/16 1909    Jeanmarie PlantMcShane, James A, MD 01/11/17 0700

## 2017-02-18 ENCOUNTER — Encounter: Payer: Self-pay | Admitting: Emergency Medicine

## 2017-02-18 ENCOUNTER — Emergency Department
Admission: EM | Admit: 2017-02-18 | Discharge: 2017-02-18 | Disposition: A | Discharge status: Home / Self Care | Payer: Self-pay | Attending: Student in an Organized Health Care Education/Training Program | Admitting: Student in an Organized Health Care Education/Training Program

## 2017-02-18 DIAGNOSIS — J029 Acute pharyngitis, unspecified: Secondary | ICD-10-CM

## 2017-02-18 DIAGNOSIS — J45909 Unspecified asthma, uncomplicated: Secondary | ICD-10-CM | POA: Insufficient documentation

## 2017-02-18 DIAGNOSIS — B9689 Other specified bacterial agents as the cause of diseases classified elsewhere: Secondary | ICD-10-CM | POA: Insufficient documentation

## 2017-02-18 DIAGNOSIS — R059 Cough, unspecified: Secondary | ICD-10-CM

## 2017-02-18 DIAGNOSIS — R05 Cough: Secondary | ICD-10-CM | POA: Insufficient documentation

## 2017-02-18 DIAGNOSIS — J028 Acute pharyngitis due to other specified organisms: Secondary | ICD-10-CM | POA: Insufficient documentation

## 2017-02-18 DIAGNOSIS — F17210 Nicotine dependence, cigarettes, uncomplicated: Secondary | ICD-10-CM | POA: Insufficient documentation

## 2017-02-18 DIAGNOSIS — Z79899 Other long term (current) drug therapy: Secondary | ICD-10-CM | POA: Insufficient documentation

## 2017-02-18 MED ORDER — BENZONATATE 100 MG PO CAPS: 100.0000 mg | ORAL_CAPSULE | Freq: Two times a day (BID) | ORAL | 0 refills | Status: AC | PRN

## 2017-02-18 MED ORDER — AZITHROMYCIN 250 MG PO TABS: ORAL_TABLET | ORAL | 0 refills | Status: AC

## 2017-02-18 NOTE — ED Notes (Signed)
Pt stating that he has been sick for the last week. Pt stating cough and sore throat. Pt stating he is coughing up green. Pt denying any known fevers except "hot and cold."

## 2017-02-18 NOTE — ED Triage Notes (Signed)
Girlfriend and mom have had same thing.

## 2017-02-18 NOTE — ED Provider Notes (Signed)
[facility] Emergency Department Provider Note   ____________________________________________   I have reviewed the triage vital signs and the nursing notes.   HISTORY  Chief Complaint Sore Throat    HPI Nathan Vasquez is a 36 y.o. male presents emergency department with sore throat, nasal congestion, sinus pressure cough and chest wall pain that has been worsening for one week. Patient reports his girlfriend and sister have had the same symptoms. Patient denies fever, chills, headache, vision changes, chest pain, chest tightness, shortness of breath, abdominal pain, nausea and vomiting.   Past Medical History:  Diagnosis Date  . Asthma   . Hep C w/o coma, chronic (HCC)     There are no active problems to display for this patient.   History reviewed. No pertinent surgical history.  Prior to Admission medications   Medication Sig Start Date End Date Taking? Authorizing Provider  albuterol (PROVENTIL) (2.5 MG/3ML) 0.083% nebulizer solution Take 3 mLs (2.5 mg total) by nebulization every 6 (six) hours as needed for wheezing or shortness of breath. 06/08/16   Jeanmarie Plant, MD  albuterol-ipratropium (COMBIVENT) 18-103 MCG/ACT inhaler Inhale 2 puffs into the lungs 4 (four) times daily as needed for wheezing. 06/08/16 06/08/17  Jeanmarie Plant, MD  azithromycin (ZITHROMAX Z-PAK) 250 MG tablet Take 2 tablets (500 mg) on  Day 1,  followed by 1 tablet (250 mg) once daily on Days 2 through 5. 02/18/17 02/23/17  Elayjah Chaney M, PA-C  benzonatate (TESSALON PERLES) 100 MG capsule Take 1 capsule (100 mg total) by mouth 2 (two) times daily as needed for cough. 02/18/17 02/18/18  Winfred Iiams M, PA-C  fluticasone (FLONASE) 50 MCG/ACT nasal spray Place 2 sprays into both nostrils daily. 12/30/16   Menshew, Charlesetta Ivory, PA-C  ibuprofen (ADVIL,MOTRIN) 800 MG tablet Take 1 tablet (800 mg total) by mouth every 8 (eight) hours as needed. 10/13/15   Ignacia Bayley, PA-C   naproxen sodium (ANAPROX) 275 MG tablet Take 1 tablet (275 mg total) by mouth 2 (two) times daily with a meal. 06/08/16 06/08/17  Jeanmarie Plant, MD  oxyCODONE-acetaminophen (ROXICET) 5-325 MG tablet Take 1 tablet by mouth every 6 (six) hours as needed. 10/13/15   Ignacia Bayley, PA-C  predniSONE (DELTASONE) 20 MG tablet Take 3 tablets (60 mg total) by mouth daily. 06/17/16 06/17/17  Myrna Blazer, MD  sulfamethoxazole-trimethoprim (BACTRIM DS,SEPTRA DS) 800-160 MG tablet Take 1 tablet by mouth 2 (two) times daily. 04/11/15   Cuthriell, Delorise Royals, PA-C  traMADol (ULTRAM) 50 MG tablet Take 1 tablet (50 mg total) by mouth every 6 (six) hours as needed for moderate pain. 01/02/15   Tommi Rumps, PA-C    Allergies Penicillins  History reviewed. No pertinent family history.  Social History Social History  Substance Use Topics  . Smoking status: Current Every Day Smoker    Packs/day: 0.50    Types: Cigarettes  . Smokeless tobacco: Never Used  . Alcohol use Yes    Review of Systems Constitutional: Negative for fever/chills Eyes: No visual changes. ENT:  Positive for sore throat and for difficulty swallowing. Nasal congestion and sinus pressure. Cardiovascular: Chest wall pain. Respiratory: Positive for cough. Denies shortness of breath. Gastrointestinal: No abdominal pain.  No nausea, vomiting, diarrhea. Skin: Negative for rash. Neurological: Negative for headaches.  ____________________________________________   PHYSICAL EXAM:  VITAL SIGNS: ED Triage Vitals  Enc Vitals Group     BP 02/18/17 1544 136/89     Pulse Rate  02/18/17 1544 90     Resp 02/18/17 1544 18     Temp 02/18/17 1544 98.2 F (36.8 C)     Temp Source 02/18/17 1544 Oral     SpO2 02/18/17 1544 95 %     Weight --      Height --      Head Circumference --      Peak Flow --      Pain Score 02/18/17 1543 8     Pain Loc --      Pain Edu? --      Excl. in GC? --     Constitutional: Alert and  oriented. Well appearing and in no acute distress.  Eyes: Conjunctivae are normal. PERRL. EOMI  Head: Normocephalic and atraumatic. ENT: Ears:Canals clear. TMs intact bilaterally. Nose: Congestion/rhinnorhea. Sinus pressure. Mouth/Throat: Mucous membranes are moist. Oropharynx mild erythema without edema or exudate. Tonsils symmetrical bilaterally. Uvula midline.  Neck:Supple. No thyromegaly. No stridor. Cardiovascular: Normal rate, regular rhythm. Good peripheral circulation. Respiratory: Nonproductive cough. Normal respiratory effort without tachypnea or retractions. Lungs CTAB. No wheezes/rales/rhonchi. Good air entry to the bases with no decreased or absent breath sounds. Hematological/Lymphatic/Immunological: No cervical lymphadenopathy. Cardiovascular: Normal rate, regular rhythm. Normal distal pulses. Gastrointestinal: Bowel sounds 4 quadrants. Soft and nontender to palpation. No CVA tenderness. Musculoskeletal: Nontender with normal range of motion in all extremities. Neurologic: Normal speech and language.  Skin:  Skin is warm, dry and intact. No rash noted. Psychiatric:Mood and affect are normal.  ____________________________________________   LABS (all labs ordered are listed, but only abnormal results are displayed)  Labs Reviewed  CULTURE, GROUP A STREP [facility])   ____________________________________________  EKG None ____________________________________________  RADIOLOGY none ____________________________________________   PROCEDURES  Procedure(s) performed: no    Critical Care performed: no ____________________________________________   INITIAL IMPRESSION / ASSESSMENT AND PLAN / ED COURSE  Pertinent labs & imaging results that were available during my care of the patient were reviewed by me and considered in my medical decision making (see chart for details).   Patient presents to the emergency department nasal congestion, sinus  pressure, rhinorrhea, sore throat and chest wall pain. History and physical exam are reassuring symptoms are consistent with nasopharyngitis/upper respiratory infection. Point-of-care strep negative with cultures pending. Patient will be prescribed azithromycin for antibiotic coverage and tessalon perles for cough as needed. I recommend patient to utilize OTC cold and sinus for symptom management as needed. Physical exam and vital signs were reassuring at this time. Patient informed of clinical course, understand medical decision-making process, and agree with plan. Patient was advised to follow up with PCP as needed and was also advised to return to the emergency department for symptoms that change or worsen.   ____________________________________________   FINAL CLINICAL IMPRESSION(S) / ED DIAGNOSES  Final diagnoses:  Acute pharyngitis due to other specified organisms  Sore throat  Cough       NEW MEDICATIONS STARTED DURING THIS VISIT:  Discharge Medication List as of 02/18/2017  5:03 PM    START taking these medications   Details  azithromycin (ZITHROMAX Z-PAK) 250 MG tablet Take 2 tablets (500 mg) on  Day 1,  followed by 1 tablet (250 mg) once daily on Days 2 through 5., Print    benzonatate (TESSALON PERLES) 100 MG capsule Take 1 capsule (100 mg total) by mouth 2 (two) times daily as needed for cough., Starting Tue 02/18/2017, Until Wed 02/18/2018, Print         Note:  This document was  prepared using Conservation officer, historic buildings and may include unintentional dictation errors.   Clois Comber, PA-C 02/18/17 1728    Willy Eddy, MD 02/18/17 [phone]

## 2017-02-18 NOTE — Discharge Instructions (Signed)
Take medication as prescribed. Return to emergency department if symptoms worsen and follow-up with PCP as needed.   °

## 2017-02-18 NOTE — ED Triage Notes (Signed)
Pt c/o sore throat and cough for about a week. No known fevers. Respirations unlabored. NAD. VSS

## 2017-02-18 NOTE — ED Notes (Signed)
Pt's POCT is negative.

## 2017-02-19 LAB — POCT RAPID STREP A: STREPTOCOCCUS, GROUP A SCREEN (DIRECT): NEGATIVE

## 2017-02-21 LAB — CULTURE, GROUP A STREP (THRC)

## 2017-05-21 ENCOUNTER — Telehealth: Payer: Self-pay | Admitting: Pharmacy Technician

## 2017-05-21 NOTE — Telephone Encounter (Signed)
Patient failed to provide current poi.  No additional medication assistance will be provided by [facility] without the required proof of income documentation.  Patient notified by letter.  Vidal Lampkins J. Stormey Wilborn Care Manager [facility] 

## 2017-05-31 ENCOUNTER — Encounter: Payer: Self-pay | Admitting: Emergency Medicine

## 2017-05-31 ENCOUNTER — Other Ambulatory Visit: Payer: Self-pay

## 2017-05-31 ENCOUNTER — Emergency Department: Payer: Self-pay

## 2017-05-31 ENCOUNTER — Emergency Department
Admission: EM | Admit: 2017-05-31 | Discharge: 2017-05-31 | Discharge status: Against Medical Advice | Payer: Self-pay | Attending: Emergency Medicine | Admitting: Emergency Medicine

## 2017-05-31 DIAGNOSIS — Z79899 Other long term (current) drug therapy: Secondary | ICD-10-CM | POA: Insufficient documentation

## 2017-05-31 DIAGNOSIS — Z5329 Procedure and treatment not carried out because of patient's decision for other reasons: Secondary | ICD-10-CM | POA: Insufficient documentation

## 2017-05-31 DIAGNOSIS — M25561 Pain in right knee: Secondary | ICD-10-CM | POA: Insufficient documentation

## 2017-05-31 DIAGNOSIS — J45909 Unspecified asthma, uncomplicated: Secondary | ICD-10-CM | POA: Insufficient documentation

## 2017-05-31 DIAGNOSIS — W19XXXA Unspecified fall, initial encounter: Secondary | ICD-10-CM | POA: Insufficient documentation

## 2017-05-31 DIAGNOSIS — F1910 Other psychoactive substance abuse, uncomplicated: Secondary | ICD-10-CM | POA: Insufficient documentation

## 2017-05-31 DIAGNOSIS — F17210 Nicotine dependence, cigarettes, uncomplicated: Secondary | ICD-10-CM | POA: Insufficient documentation

## 2017-05-31 NOTE — ED Provider Notes (Signed)
[facility] Emergency Department Provider Note ____________________________________________   First MD Initiated Contact with Patient 05/31/17 1804     (approximate)  I have reviewed the triage vital signs and the nursing notes.   HISTORY  Chief Complaint Drug Overdose    HPI Nathan Vasquez is a 37 y.o. male with past medical history as noted below and also a history of heroin abuse who presents after using heroin at approximately 2 PM and getting Narcan, with concern that the Narcan would wear off and the patient would have difficulty breathing.  The patient states that he snorted heroin at approximately 2 PM.  He states that he got the drug from a new source, and that it was different than what he had used previously.  He reports that he had been clean for several months prior to this episode.  He got Narcan from the police at approximately 3 PM and felt better, but then over the last hour he started to feel lightheaded and weak and was afraid that he would pass out and stop breathing.  Now he is feeling slightly better again and feels like it is wearing off.  The patient states that after using heroin he fell and hurt his right knee, and reports pain there currently.  He has been able to ambulate.  Past Medical History:  Diagnosis Date  . Asthma   . Hep C w/o coma, chronic (HCC)     There are no active problems to display for this patient.   History reviewed. No pertinent surgical history.  Prior to Admission medications   Medication Sig Start Date End Date Taking? Authorizing Provider  albuterol (PROVENTIL) (2.5 MG/3ML) 0.083% nebulizer solution Take 3 mLs (2.5 mg total) by nebulization every 6 (six) hours as needed for wheezing or shortness of breath. 06/08/16   Jeanmarie Plant, MD  albuterol-ipratropium (COMBIVENT) 18-103 MCG/ACT inhaler Inhale 2 puffs into the lungs 4 (four) times daily as needed for wheezing. 06/08/16 06/08/17  Jeanmarie Plant, MD  benzonatate (TESSALON PERLES) 100 MG capsule Take 1 capsule (100 mg total) by mouth 2 (two) times daily as needed for cough. 02/18/17 02/18/18  Little, Traci M, PA-C  fluticasone (FLONASE) 50 MCG/ACT nasal spray Place 2 sprays into both nostrils daily. 12/30/16   Menshew, Charlesetta Ivory, PA-C  ibuprofen (ADVIL,MOTRIN) 800 MG tablet Take 1 tablet (800 mg total) by mouth every 8 (eight) hours as needed. 10/13/15   Ignacia Bayley, PA-C  naproxen sodium (ANAPROX) 275 MG tablet Take 1 tablet (275 mg total) by mouth 2 (two) times daily with a meal. 06/08/16 06/08/17  Jeanmarie Plant, MD  oxyCODONE-acetaminophen (ROXICET) 5-325 MG tablet Take 1 tablet by mouth every 6 (six) hours as needed. 10/13/15   Ignacia Bayley, PA-C  predniSONE (DELTASONE) 20 MG tablet Take 3 tablets (60 mg total) by mouth daily. 06/17/16 06/17/17  Myrna Blazer, MD  sulfamethoxazole-trimethoprim (BACTRIM DS,SEPTRA DS) 800-160 MG tablet Take 1 tablet by mouth 2 (two) times daily. 04/11/15   Cuthriell, Delorise Royals, PA-C  traMADol (ULTRAM) 50 MG tablet Take 1 tablet (50 mg total) by mouth every 6 (six) hours as needed for moderate pain. 01/02/15   Tommi Rumps, PA-C    Allergies Penicillins  History reviewed. No pertinent family history.  Social History Social History   Tobacco Use  . Smoking status: Current Every Day Smoker    Packs/day: 0.50    Types: Cigarettes  . Smokeless tobacco: Never Used  Substance Use Topics  . Alcohol use: Yes  . Drug use: Yes    Comment: heroin.  last used 4-5 days ago.    Review of Systems  Constitutional: No fever. Eyes: No redness. ENT: No neck pain.  Cardiovascular: Denies chest pain. Respiratory: Denies shortness of breath. Gastrointestinal: No vomiting.   Genitourinary: Negative for flank pain.  Musculoskeletal: Positive for right knee pain. Skin: Negative for rash. Neurological: Negative for  headache.   ____________________________________________   PHYSICAL EXAM:  VITAL SIGNS: ED Triage Vitals  Enc Vitals Group     BP 05/31/17 1801 115/76     Pulse Rate 05/31/17 1801 73     Resp 05/31/17 1803 18     Temp 05/31/17 1803 97.8 F (36.6 C)     Temp Source 05/31/17 1803 Oral     SpO2 05/31/17 1801 95 %     Weight 05/31/17 1757 189 lb (85.7 kg)     Height 05/31/17 1757 6' (1.829 m)     Head Circumference --      Peak Flow --      Pain Score 05/31/17 1803 4     Pain Loc --      Pain Edu? --      Excl. in GC? --     Constitutional: Alert and oriented. Well appearing and in no acute distress. Eyes: Conjunctivae are normal.  EOMI. PERRLA.  Head: Atraumatic. Nose: No congestion/rhinnorhea. Mouth/Throat: Mucous membranes are slightly dry.   Neck: Normal range of motion.  Cardiovascular: Normal rate, regular rhythm. Grossly normal heart sounds.  Good peripheral circulation. Respiratory: Normal respiratory effort.  No retractions. Lungs CTAB. Gastrointestinal: Soft and nontender. No distention.  Genitourinary: No flank tenderness. Musculoskeletal: No lower extremity edema.  Extremities warm and well perfused.  Right knee with mild medial and patellar area bony tenderness, but full range of motion, extension intact, and no deformity.  2+ DP pulse. Neurologic:  Normal speech and language. No gross focal neurologic deficits are appreciated.  Skin:  Skin is warm and dry. No rash noted. Psychiatric: Mood and affect are normal. Speech and behavior are normal.  ____________________________________________   LABS (all labs ordered are listed, but only abnormal results are displayed)  Labs Reviewed - No data to display ____________________________________________  EKG   ____________________________________________  RADIOLOGY    ____________________________________________   PROCEDURES  Procedure(s) performed: No    Critical Care performed:  No ____________________________________________   INITIAL IMPRESSION / ASSESSMENT AND PLAN / ED COURSE  Pertinent labs & imaging results that were available during my care of the patient were reviewed by me and considered in my medical decision making (see chart for details).  37 year old male with past medical history as noted above and with relapse today on heroin presents with a concern that he may pass out again after having gotten Narcan several hours ago.  Per the patient, he took the heroin around 2 PM and got the Narcan around 3 PM.  He states that he started to feel weak and as if he might pass out so he got concerned and came to the emergency department.  On exam, the patient is currently awake and alert with normal vital signs, and maintaining normal respirations and good oxygenation.  He has pain to the right knee with some tenderness, but no deformity or obvious injury.  Presentation is consistent with substance abuse.  Given that has been more than 3 hours since the patient got the Narcan I suspect that the heroin is  wearing off and the Narcan has already long worn off.  However I offered to observe the patient for 1-2 hours additionally to make sure that the symptoms were resolving, the patient agreed.  We had also plan to obtain x-rays of the right knee pending trial of ambulation.  ----------------------------------------- 7:59 PM on 05/31/2017 -----------------------------------------  The patient is no longer in the room and appears to have eloped.  Given that he was only to be observed for an additional 1-2 hours, he was medically cleared and there is no indication to recall him to the ED at this time.  Patient eloped before an x-ray could be completed.      ____________________________________________   FINAL CLINICAL IMPRESSION(S) / ED DIAGNOSES  Final diagnoses:  Substance abuse (HCC)      NEW MEDICATIONS STARTED DURING THIS VISIT:  This SmartLink is  deprecated. Use AVSMEDLIST instead to display the medication list for a patient.   Note:  This document was prepared using Dragon voice recognition software and may include unintentional dictation errors.    Dionne BucySiadecki, Soua Lenk, MD 05/31/17 2000

## 2017-05-31 NOTE — ED Notes (Addendum)
This nurse entered room to introduce self to pt, pt not present in room, first nurse called, she reports she just sent visitor back to room less than 5 minutes ago, no visitor seen in room

## 2017-05-31 NOTE — ED Notes (Signed)
[employer] informed of pt elopement

## 2017-05-31 NOTE — ED Triage Notes (Signed)
Pt oriented, mildly drowsy. NAD. Took heroine and police gave narcan.  Told girlfriend to bring to ED in case needed more.

## 2017-06-11 ENCOUNTER — Encounter: Payer: Self-pay | Admitting: Emergency Medicine

## 2017-06-11 DIAGNOSIS — F17210 Nicotine dependence, cigarettes, uncomplicated: Secondary | ICD-10-CM | POA: Insufficient documentation

## 2017-06-11 DIAGNOSIS — J45909 Unspecified asthma, uncomplicated: Secondary | ICD-10-CM | POA: Insufficient documentation

## 2017-06-11 DIAGNOSIS — N476 Balanoposthitis: Secondary | ICD-10-CM | POA: Insufficient documentation

## 2017-06-11 DIAGNOSIS — Z79899 Other long term (current) drug therapy: Secondary | ICD-10-CM | POA: Insufficient documentation

## 2017-06-11 LAB — URINALYSIS, COMPLETE (UACMP) WITH MICROSCOPIC
Bacteria, UA: NONE SEEN
Bilirubin Urine: NEGATIVE
GLUCOSE, UA: NEGATIVE mg/dL
HGB URINE DIPSTICK: NEGATIVE
Ketones, ur: NEGATIVE mg/dL
Nitrite: NEGATIVE
Protein, ur: NEGATIVE mg/dL
SPECIFIC GRAVITY, URINE: 1.013 (ref 1.005–1.030)
pH: 5 (ref 5.0–8.0)

## 2017-06-11 NOTE — ED Triage Notes (Signed)
Pt comes into the ED via POV c/o possible UTI and yeast infection.  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  Patient states that the foreskin is so tender that he has a hard time urinating now due to this.  Patient ambulatory to triage at this time and in NAD with even and unlabored respirations.  Denies any new sexual partners or the thought of an STD.

## 2017-06-12 ENCOUNTER — Encounter: Payer: Self-pay | Admitting: Emergency Medicine

## 2017-06-12 ENCOUNTER — Emergency Department
Admission: EM | Admit: 2017-06-12 | Discharge: 2017-06-12 | Disposition: A | Discharge status: Home / Self Care | Payer: Self-pay | Attending: Emergency Medicine | Admitting: Emergency Medicine

## 2017-06-12 DIAGNOSIS — N476 Balanoposthitis: Secondary | ICD-10-CM

## 2017-06-12 HISTORY — DX: Personal history of other diseases of male genital organs: Z87.438

## 2017-06-12 HISTORY — DX: Opioid abuse, uncomplicated: F11.10

## 2017-06-12 LAB — CHLAMYDIA/NGC RT PCR (ARMC ONLY)
CHLAMYDIA TR: NOT DETECTED
N gonorrhoeae: NOT DETECTED

## 2017-06-12 LAB — RAPID HIV SCREEN (HIV 1/2 AB+AG)
HIV 1/2 Antibodies: NONREACTIVE
HIV-1 P24 Antigen - HIV24: NONREACTIVE

## 2017-06-12 LAB — GLUCOSE, CAPILLARY: Glucose-Capillary: 96 mg/dL (ref 65–99)

## 2017-06-12 MED ORDER — NYSTATIN 100000 UNIT/GM EX CREA
TOPICAL_CREAM | CUTANEOUS | Status: AC
  Administered 2017-06-12: 03:00:00 via TOPICAL
  Filled 2017-06-12: qty 15

## 2017-06-12 MED ORDER — IBUPROFEN 800 MG PO TABS
800.0000 mg | ORAL_TABLET | Freq: Once | ORAL | Status: AC
  Administered 2017-06-12: 800 mg via ORAL

## 2017-06-12 MED ORDER — CEPHALEXIN 500 MG PO CAPS: 500.0000 mg | ORAL_CAPSULE | Freq: Three times a day (TID) | ORAL | 0 refills | Status: DC

## 2017-06-12 MED ORDER — CEPHALEXIN 500 MG PO CAPS
500.0000 mg | ORAL_CAPSULE | Freq: Once | ORAL | Status: AC
  Administered 2017-06-12: 500 mg via ORAL
  Filled 2017-06-12: qty 1

## 2017-06-12 MED ORDER — MUPIROCIN CALCIUM 2 % EX CREA
TOPICAL_CREAM | Freq: Once | CUTANEOUS | Status: AC
  Administered 2017-06-12: 1 via TOPICAL
  Filled 2017-06-12: qty 15

## 2017-06-12 MED ORDER — OXYCODONE-ACETAMINOPHEN 5-325 MG PO TABS
2.0000 | ORAL_TABLET | Freq: Once | ORAL | Status: AC
  Administered 2017-06-12: 2 via ORAL
  Filled 2017-06-12: qty 2

## 2017-06-12 MED ORDER — IBUPROFEN 800 MG PO TABS
ORAL_TABLET | ORAL | Status: AC
  Administered 2017-06-12: 800 mg via ORAL
  Filled 2017-06-12: qty 1

## 2017-06-12 MED ORDER — FLUCONAZOLE 50 MG PO TABS
150.0000 mg | ORAL_TABLET | Freq: Once | ORAL | Status: AC
  Administered 2017-06-12: 150 mg via ORAL
  Filled 2017-06-12: qty 1

## 2017-06-12 NOTE — ED Provider Notes (Signed)
[facility] Emergency Department Provider Note  ____________________________________________   First MD Initiated Contact with Patient 06/12/17 0118     (approximate)  I have reviewed the triage vital signs and the nursing notes.   HISTORY  Chief Complaint Urinary Tract Infection    HPI Nathan Vasquez is a 37 y.o. male with medical history as listed below who presents for evaluation of gradually worsening pain, swelling, and redness on the tip of his penis and foreskin.  Symptoms have been gradually worsening for at least a week.  He has had symptoms in the past similar to this multiple times and attributes it to him not being circumcised.  He states that he has no insurance and no money to go to doctors so he has not been to a urologist.  He has not had any new sexual partners and he is present today with his male partner with whom he says he is monogamous.  The pain and swelling is around the foreskin and glans of the penis.  Occasionally it bleeds because it is so irritated and there is a large amount that looks like pus.  It burns when he urinates but he thinks it is because of the irritation to the skin.  He denies fever/chills, chest pain, shortness of breath.  He has had some nausea and vomiting.  He reports not feeling well for a long time and states that he thinks may be he has diabetes.  He describes the symptoms as severe and he appears uncomfortable and cannot find a position of comfort.  Of note, he was in the emergency department just over a week ago for accidental heroin overdose.  Medical records indicate that he eloped prior to being formally discharged.   Past Medical History:  Diagnosis Date  . Asthma   . Hep C w/o coma, chronic (HCC)   . Heroin abuse (HCC)   . History of balanitis     There are no active problems to display for this patient.   History reviewed. No pertinent surgical history.  Prior to Admission medications     Medication Sig Start Date End Date Taking? Authorizing Provider  albuterol (PROVENTIL) (2.5 MG/3ML) 0.083% nebulizer solution Take 3 mLs (2.5 mg total) by nebulization every 6 (six) hours as needed for wheezing or shortness of breath. 06/08/16   Jeanmarie Plant, MD  albuterol-ipratropium (COMBIVENT) 18-103 MCG/ACT inhaler Inhale 2 puffs into the lungs 4 (four) times daily as needed for wheezing. 06/08/16 06/08/17  Jeanmarie Plant, MD  benzonatate (TESSALON PERLES) 100 MG capsule Take 1 capsule (100 mg total) by mouth 2 (two) times daily as needed for cough. 02/18/17 02/18/18  Little, Traci M, PA-C  cephALEXin (KEFLEX) 500 MG capsule Take 1 capsule (500 mg total) by mouth 3 (three) times daily. 06/12/17   Loleta Rose, MD  fluticasone (FLONASE) 50 MCG/ACT nasal spray Place 2 sprays into both nostrils daily. 12/30/16   Menshew, Charlesetta Ivory, PA-C  ibuprofen (ADVIL,MOTRIN) 800 MG tablet Take 1 tablet (800 mg total) by mouth every 8 (eight) hours as needed. 10/13/15   Ignacia Bayley, PA-C  oxyCODONE-acetaminophen (ROXICET) 5-325 MG tablet Take 1 tablet by mouth every 6 (six) hours as needed. 10/13/15   Ignacia Bayley, PA-C  predniSONE (DELTASONE) 20 MG tablet Take 3 tablets (60 mg total) by mouth daily. 06/17/16 06/17/17  Myrna Blazer, MD  sulfamethoxazole-trimethoprim (BACTRIM DS,SEPTRA DS) 800-160 MG tablet Take 1 tablet by mouth 2 (two) times daily. 04/11/15  Cuthriell, Delorise Royals, PA-C  traMADol (ULTRAM) 50 MG tablet Take 1 tablet (50 mg total) by mouth every 6 (six) hours as needed for moderate pain. 01/02/15   Tommi Rumps, PA-C    Allergies Penicillins  History reviewed. No pertinent family history.  Social History Social History   Tobacco Use  . Smoking status: Current Every Day Smoker    Packs/day: 0.50    Types: Cigarettes  . Smokeless tobacco: Never Used  Substance Use Topics  . Alcohol use: Yes  . Drug use: Yes    Comment: heroin.  last used 4-5 days ago.    Review of  Systems Constitutional: No fever/chills Eyes: No visual changes. ENT: No sore throat. Cardiovascular: Denies chest pain. Respiratory: Denies shortness of breath. Gastrointestinal: No abdominal pain.  Occasional N/V.  No diarrhea.  No constipation. Genitourinary: Pain, swelling, and redness to foreskin and head of penis.  Copious discharge.  Pain. Musculoskeletal: Negative for neck pain.  Negative for back pain. Integumentary: Negative for rash. Neurological: Negative for headaches, focal weakness or numbness.   ____________________________________________   PHYSICAL EXAM:  VITAL SIGNS: ED Triage Vitals [06/11/17 2229]  Enc Vitals Group     BP (!) 141/90     Pulse Rate 73     Resp 17     Temp 97.8 F (36.6 C)     Temp Source Oral     SpO2 97 %     Weight 85.7 kg (189 lb)     Height 1.829 m (6')     Head Circumference      Peak Flow      Pain Score      Pain Loc      Pain Edu?      Excl. in GC?     Constitutional: Alert and oriented. Generally well appearing but looks  uncomfortable Eyes: Conjunctivae are normal.  Head: Atraumatic. Nose: No congestion/rhinnorhea. Mouth/Throat: Mucous membranes are moist.  No evidence of thrush. Cardiovascular: Normal rate, regular rhythm. Good peripheral circulation. Grossly normal heart sounds. Respiratory: Normal respiratory effort.  No retractions. Lungs CTAB. Gastrointestinal: Soft and nontender. No distention.  Genitourinary: Uncircumcised male.  There is erythema and irritation but no vesicular lesions on the foreskin.  Patient is able to fully retract his foreskin and there is no evidence of phimosis or paraphimosis.  There is a copious amount of thick, purulent discharge within the folds of the foreskin but it does not seem to be coming from the urethra.  Appears very tender and grossly infected but without any evidence of soft tissue infection extending proximally from the foreskin. Musculoskeletal: No lower extremity tenderness  nor edema. No gross deformities of extremities. Neurologic:  Normal speech and language. No gross focal neurologic deficits are appreciated.  Skin:  Skin is warm, dry and intact. No rash noted. Psychiatric: Mood and affect are normal. Speech and behavior are normal.  ____________________________________________   LABS (all labs ordered are listed, but only abnormal results are displayed)  Labs Reviewed  URINALYSIS, COMPLETE (UACMP) WITH MICROSCOPIC - Abnormal; Notable for the following components:      Result Value   Color, Urine YELLOW (*)    APPearance CLEAR (*)    Leukocytes, UA TRACE (*)    Squamous Epithelial / LPF 0-5 (*)    All other components within normal limits  CHLAMYDIA/NGC RT PCR ([facility] ONLY)  RAPID HIV SCREEN (HIV 1/2 AB+AG)  GLUCOSE, CAPILLARY  CBG MONITORING, ED   ____________________________________________  EKG  None -  EKG not ordered by ED physician ____________________________________________  RADIOLOGY   No results found.  ____________________________________________   PROCEDURES  Critical Care performed: No   Procedure(s) performed:   Procedures   ____________________________________________   INITIAL IMPRESSION / ASSESSMENT AND PLAN / ED COURSE  As part of my medical decision making, I reviewed the following data within the electronic MEDICAL RECORD NUMBER Nursing notes reviewed and incorporated, Labs reviewed , Old chart reviewed and Notes from prior ED visits  Differential diagnosis includes, but is not limited to, balanitis/balanoposthitis (including both fungal and bacterial infections), phimosis, paraphimosis, urinary tract infection, STD including HIV as well as GC/chlamydia.  I believe that balanoposthitis with a combination fungal/bacterial infection is the most likely.  The patient continued to have multiple complaints including his question about diabetes the more we talked so I agreed to check a fingerstick blood sugar.  I  looked back in his record and he had normal labs a year ago.  On his recent visit for heroin overdose no metabolic panel was obtained but his CBC was normal.  I strongly encouraged him to follow-up with urology and if he cannot afford [facility] to [facility].  I will treat him broadly with both mupirocin cream and nystatin cream which I provided due to his financial limitations, as well as by mouth with 1 dose of fluconazole here and a prescription for Keflex for bacterial infection.  I gave him strict return precautions and strongly encouraged him to follow-up with urology.  I will also check an HIV screening based on his recurrent infections.  He has no evidence of oral thrush at this time.  Clinical Course as of Jun 12 520  Thu Jun 12, 2017  0210 Checked [place] controlled substances database.  Patient has multiple prior prescriptions for Suboxone but has had nothing filled for about 5 months.  [CF]  0319 Patient is walking around and wants to go home.  He does not want to wait for the results of his HIV screening nor his GC chlamydia.  His capillary blood glucose was reassuring at 96 which, combined with his normal lab work a year ago, strongly suggests that he does not have diabetes.  I gave him my usual and customary return precautions.  [CF]  0327 HIV non-reactive  [CF]    Clinical Course User Index [CF] Loleta RoseForbach, Conley Delisle, MD    ____________________________________________  FINAL CLINICAL IMPRESSION(S) / ED DIAGNOSES  Final diagnoses:  Balanoposthitis     MEDICATIONS GIVEN DURING THIS VISIT:  Medications  ibuprofen (ADVIL,MOTRIN) tablet 800 mg (800 mg Oral Given 06/12/17 0157)  oxyCODONE-acetaminophen (PERCOCET/ROXICET) 5-325 MG per tablet 2 tablet (2 tablets Oral Given 06/12/17 0249)  mupirocin cream (BACTROBAN) 2 % (1 application Topical Given 06/12/17 0250)  nystatin cream (MYCOSTATIN) ( Topical Given 06/12/17 0250)  fluconazole (DIFLUCAN) tablet 150 mg (150 mg Oral Given 06/12/17 0249)    cephALEXin (KEFLEX) capsule 500 mg (500 mg Oral Given 06/12/17 0248)     ED Discharge Orders        Ordered    cephALEXin (KEFLEX) 500 MG capsule  3 times daily     06/12/17 0323       Note:  This document was prepared using Dragon voice recognition software and may include unintentional dictation errors.    Loleta RoseForbach, Allix Blomquist, MD 06/12/17 [id number]

## 2017-06-12 NOTE — Discharge Instructions (Signed)
Please read through the included information and PLEASE follow up with Dr. Apolinar JunesBrandon or one of her urology colleagues at the contact number provided.  Your blood sugar was normal tonight, which is reassuring.  You did not want to stay for the results of your HIV testing, and some additional testing is pending as well.  You will be contacted if the results indicate you have an infection that was not completely treated with the creams and prescription provided.    Return to the emergency department if you develop new or worsening symptoms that concern you.

## 2017-06-15 ENCOUNTER — Encounter: Payer: Self-pay | Admitting: *Deleted

## 2017-06-15 ENCOUNTER — Emergency Department
Admission: EM | Admit: 2017-06-15 | Discharge: 2017-06-15 | Disposition: A | Discharge status: Home / Self Care | Payer: Self-pay | Attending: Emergency Medicine | Admitting: Emergency Medicine

## 2017-06-15 ENCOUNTER — Other Ambulatory Visit: Payer: Self-pay

## 2017-06-15 DIAGNOSIS — N481 Balanitis: Secondary | ICD-10-CM

## 2017-06-15 DIAGNOSIS — N476 Balanoposthitis: Secondary | ICD-10-CM | POA: Insufficient documentation

## 2017-06-15 DIAGNOSIS — J45909 Unspecified asthma, uncomplicated: Secondary | ICD-10-CM | POA: Insufficient documentation

## 2017-06-15 DIAGNOSIS — Z79899 Other long term (current) drug therapy: Secondary | ICD-10-CM | POA: Insufficient documentation

## 2017-06-15 DIAGNOSIS — F17210 Nicotine dependence, cigarettes, uncomplicated: Secondary | ICD-10-CM | POA: Insufficient documentation

## 2017-06-15 DIAGNOSIS — R309 Painful micturition, unspecified: Secondary | ICD-10-CM

## 2017-06-15 DIAGNOSIS — N4889 Other specified disorders of penis: Secondary | ICD-10-CM

## 2017-06-15 LAB — URINALYSIS, COMPLETE (UACMP) WITH MICROSCOPIC
Bacteria, UA: NONE SEEN
Bilirubin Urine: NEGATIVE
Glucose, UA: NEGATIVE mg/dL
Hgb urine dipstick: NEGATIVE
KETONES UR: NEGATIVE mg/dL
Leukocytes, UA: NEGATIVE
Nitrite: NEGATIVE
PH: 5 (ref 5.0–8.0)
Protein, ur: NEGATIVE mg/dL
Specific Gravity, Urine: 1.026 (ref 1.005–1.030)

## 2017-06-15 LAB — BASIC METABOLIC PANEL
Anion gap: 8 (ref 5–15)
BUN: 15 mg/dL (ref 6–20)
CHLORIDE: 107 mmol/L (ref 101–111)
CO2: 26 mmol/L (ref 22–32)
Calcium: 9.1 mg/dL (ref 8.9–10.3)
Creatinine, Ser: 1.1 mg/dL (ref 0.61–1.24)
GFR calc Af Amer: >60 mL/min (ref >60)
GFR calc non Af Amer: >60 mL/min (ref >60)
Glucose, Bld: 98 mg/dL (ref 65–99)
POTASSIUM: 4.8 mmol/L (ref 3.5–5.1)
SODIUM: 141 mmol/L (ref 135–145)

## 2017-06-15 LAB — CBC WITH DIFFERENTIAL/PLATELET
Basophils Absolute: 0.1 10*3/uL (ref 0–0.1)
Basophils Relative: 2 %
EOS ABS: 0.3 10*3/uL (ref 0–0.7)
Eosinophils Relative: 5 %
HCT: 48.8 % (ref 40.0–52.0)
HEMOGLOBIN: 16.4 g/dL (ref 13.0–18.0)
LYMPHS ABS: 2 10*3/uL (ref 1.0–3.6)
LYMPHS PCT: 34 %
MCH: 31.6 pg (ref 26.0–34.0)
MCHC: 33.5 g/dL (ref 32.0–36.0)
MCV: 94.4 fL (ref 80.0–100.0)
Monocytes Absolute: 0.3 10*3/uL (ref 0.2–1.0)
Monocytes Relative: 5 %
NEUTROS PCT: 54 %
Neutro Abs: 3.2 10*3/uL (ref 1.4–6.5)
Platelets: 162 10*3/uL (ref 150–440)
RBC: 5.17 MIL/uL (ref 4.40–5.90)
RDW: 13.8 % (ref 11.5–14.5)
WBC: 5.9 10*3/uL (ref 3.8–10.6)

## 2017-06-15 LAB — GLUCOSE, CAPILLARY: Glucose-Capillary: 85 mg/dL (ref 65–99)

## 2017-06-15 MED ORDER — MORPHINE SULFATE (PF) 4 MG/ML IV SOLN
4.0000 mg | Freq: Once | INTRAVENOUS | Status: AC
  Administered 2017-06-15: 4 mg via INTRAMUSCULAR

## 2017-06-15 MED ORDER — KETOROLAC TROMETHAMINE 30 MG/ML IJ SOLN
INTRAMUSCULAR | Status: AC
  Administered 2017-06-15: 30 mg via INTRAMUSCULAR
  Filled 2017-06-15: qty 1

## 2017-06-15 MED ORDER — CIPROFLOXACIN HCL 500 MG PO TABS: 500.0000 mg | ORAL_TABLET | Freq: Two times a day (BID) | ORAL | 0 refills | Status: AC

## 2017-06-15 MED ORDER — KETOROLAC TROMETHAMINE 30 MG/ML IJ SOLN
30.0000 mg | Freq: Once | INTRAMUSCULAR | Status: AC
  Administered 2017-06-15: 30 mg via INTRAMUSCULAR

## 2017-06-15 MED ORDER — MORPHINE SULFATE (PF) 4 MG/ML IV SOLN
INTRAVENOUS | Status: AC
  Administered 2017-06-15: 4 mg via INTRAMUSCULAR
  Filled 2017-06-15: qty 1

## 2017-06-15 MED ORDER — LIDOCAINE 5 % EX OINT
TOPICAL_OINTMENT | Freq: Three times a day (TID) | CUTANEOUS | Status: DC | PRN
  Administered 2017-06-15: 22:00:00 via TOPICAL
  Filled 2017-06-15: qty 35.44

## 2017-06-15 MED ORDER — CEFTRIAXONE SODIUM 1 G IJ SOLR
2.0000 g | Freq: Once | INTRAMUSCULAR | Status: AC
  Administered 2017-06-15: 2 g via INTRAMUSCULAR
  Filled 2017-06-15 (×2): qty 20

## 2017-06-15 MED ORDER — CEFTRIAXONE SODIUM 1 G IJ SOLR: 2.0000 g | Freq: Once | INTRAMUSCULAR | Status: DC

## 2017-06-15 MED ORDER — LIDOCAINE HCL 2 % EX GEL
CUTANEOUS | Status: AC
  Filled 2017-06-15: qty 10

## 2017-06-15 NOTE — ED Notes (Signed)
Pt verbalizes d/c understanding and follow up with RX. Pt in NAD at time of departure, VS stable, pt ambulatory. Pt girlfriend at bedside who is driving pt

## 2017-06-15 NOTE — ED Triage Notes (Signed)
Pt to ED reporting he was seen last week for irritation noted to penis. Pt was tested and cleared for STDs but reports the rash has worsened and is now open sores and the head of his penis is swollen. Pt reported he is having burning when urinating. Pt denies having noticed blood in urine. No fevers.

## 2017-06-15 NOTE — Consult Note (Signed)
Urology Consult  Consulting MD: Dr Fanny BienQuale  CC: Penile infection/irritation  HPI: This is a [age]year old male who returns to the [facility] emergency room tonight with persistent penile pain/swelling/significant burning with urination/difficulty with retracting his foreskin.  The patient presented to the emergency room approximately 3 days ago with what was treated his balanitis.  At that time, he had irritation of the glans of his penis.  By the time he presented, this process has been going on for 3-4 days.  He was treated with mupirocin, Keflex, clotrimazole topically as well as Diflucan.  Despite this, and the patient taking medications as prescribed, he has not had improvement, and actually thinks that his penis has become worse.  He is sexually active with the same partner.  Prior to his presentation, apparently he did have oral sex performed on him and did have some abrasion to the glans of his penis.  He denies anal sex.  He does have a history of IV drug abuse, but had negative GC/gonorrhea screening as well as HIV screening at his last visit.  He has not had any fever.  He has not noted any lumps in his groins.  He denies prior severe symptoms like this, but has been treated for yeast infections in the past.  PMH: Past Medical History:  Diagnosis Date  . Asthma   . Hep C w/o coma, chronic (HCC)   . Heroin abuse (HCC)   . History of balanitis     PSH: History reviewed. No pertinent surgical history.  Allergies: Allergies  Allergen Reactions  . Penicillins Rash    Medications:  (Not in a hospital admission)   Social History: Social History   Socioeconomic History  . Marital status: Single    Spouse name: Not on file  . Number of children: Not on file  . Years of education: Not on file  . Highest education level: Not on file  Social Needs  . Financial resource strain: Not on file  . Food insecurity - worry: Not on file  . Food insecurity - inability: Not on file  .  Transportation needs - medical: Not on file  . Transportation needs - non-medical: Not on file  Occupational History  . Not on file  Tobacco Use  . Smoking status: Current Every Day Smoker    Packs/day: 0.50    Types: Cigarettes  . Smokeless tobacco: Never Used  Substance and Sexual Activity  . Alcohol use: Yes  . Drug use: No    Comment: heroin.  last used 4-5 days ago.  [name]. Sexual activity: Not on file  Other Topics Concern  . Not on file  Social History Narrative  . Not on file    Family History: History reviewed. No pertinent family history.  Review of Systems: Positive: Discomfort/pain with voiding, penile pain. Negative: No gross hematuria, no fever, chills.  No abdominal pain..  A further 10 point review of systems was negative except what is listed in the HPI.  Physical Exam: @VITALS2 @ General: No acute distress.  Awake. Head:  Normocephalic.  Atraumatic. ENT:  EOMI.  Mucous membranes moist Neck:  Supple.  No lymphadenopathy. CV:  Regular rate. Pulmonary: Equal effort bilaterally.   Abdomen: Soft.  Non-tender to palpation. Skin:  Normal turgor.  No visible rash. Extremity: No gross deformity of bilateral upper extremities.  No gross deformity of bilateral lower extremities. Neurologic: Alert. Appropriate mood.  Penis:  Uncircumcised.  No lesions.  Distal foreskin involved with a significant dermatologic process.  There                          are  raised lesions which are yellowish in nature, excoriated skin, all basically confined to the distal                                foreskin, which is mildly to moderately phimotic.  Underneath, the glans appears uninvolved.                                          However, I was unable to totally retract the foreskin for full examination., Urethra:  Orthotopic meatus. Scrotum: No lesions.  No ecchymosis.  No erythema. Testicles: Descended bilaterally.  No masses bilaterally. Epididymis: Palpable bilaterally.  Non Tender  to palpation. Lymph nodes: No palpable groin adenopathy.  Studies:  Recent Labs    06/15/17 2053  HGB 16.4  WBC 5.9  PLT 162    No results for input(s): NA, K, CL, CO2, BUN, CREATININE, CALCIUM, GFRNONAA, GFRAA in the last 72 hours.  Invalid input(s): MAGNESIUM   No results for input(s): INR, APTT in the last 72 hours.  Invalid input(s): PT   Invalid input(s): ABG    Assessment: Significant infection of the penis.  At this point, it is obvious he does not have GC/chlamydia.  However, other bacterial/STD infection may well be present.  With the infection persistent despite having been on an oral and topical antifungal, I do not think that this is fungal in nature.  This does not seem to be a systemic process at the present time, but it is obviously quite painful to the patient.  Plan: 1.  I will have the patient sent home with some lidocaine jelly to use locally as needed  2.  Rocephin 2 g IM  3.  I think it is fine to stop the Keflex at this point, but at this point will recommend switching to ciprofloxacin 500 mg twice daily for 5 days  4.  I am fine with continuing the mupirocin as well as the nystatin cream  I would suggest proper follow-up with urology or dermatology within the next 3-4 days or this may be difficult, as the patient may well be incarcerated soon.    Pager:[phone]

## 2017-06-15 NOTE — ED Notes (Signed)
Urologist at bedside.

## 2017-06-15 NOTE — ED Triage Notes (Signed)
First Nurse Note:  Arrives with C/O swelling to tip of penis and painful urination.  Symptoms similar to those that patient was seen through ED for last week.

## 2017-06-15 NOTE — ED Provider Notes (Signed)
[facility] Emergency Department Provider Note   ____________________________________________   First MD Initiated Contact with Patient 06/15/17 [phone]     (approximate)  I have reviewed the triage vital signs and the nursing notes.   HISTORY  Chief Complaint Rash and Dysuria    HPI Nathan Vasquez is a 37 y.o. male presents for evaluation of ongoing pain discomfort and ulcer formation on the shaft of penis.  Patient reports that for several days and he came to the ER once 3 days ago that he has been experiencing pain and redness to the tip of his penis and along his foreskin.  Symptoms started several days ago, he came to the ER and was given a prescription for antibiotics which she reports is filled and is taking, also various creams and was given a pain medicine here.  He is continuing to use both prescribed creams and placing these on the foreskin and glans.  Today while taking a bath he retracted his foreskin reports are so swollen and got stuck around the tip of his penis, it was stuck for about 15-20 minutes and he reports the tip of his penis started to turn purple.  After severe pain and pushing hard on it he was able to get the foreskin to go back over the tip of the glans/tip of his penis and his pain was relieved.  He continues report of burning and erosion feeling, notes that he has ulcer forming along the foreskin now.  He has not been able to retract the foreskin again at this time.  When he urinates, he is not able to see his urethra.  Denies abdominal pain.  No fevers or chills.  Some whitish discharge from around the edges of the foreskin.  Reports that he did have a heroin overdose about a week ago he had not used in several months time he reports, and reports that he is not ever going to go back to using heroin again.  Past Medical History:  Diagnosis Date  . Asthma   . Hep C w/o coma, chronic (HCC)   . Heroin abuse (HCC)   .  History of balanitis     There are no active problems to display for this patient.   History reviewed. No pertinent surgical history.  Prior to Admission medications   Medication Sig Start Date End Date Taking? Authorizing Provider  albuterol (PROVENTIL) (2.5 MG/3ML) 0.083% nebulizer solution Take 3 mLs (2.5 mg total) by nebulization every 6 (six) hours as needed for wheezing or shortness of breath. 06/08/16   Jeanmarie Plant, MD  albuterol-ipratropium (COMBIVENT) 18-103 MCG/ACT inhaler Inhale 2 puffs into the lungs 4 (four) times daily as needed for wheezing. 06/08/16 06/08/17  Jeanmarie Plant, MD  benzonatate (TESSALON PERLES) 100 MG capsule Take 1 capsule (100 mg total) by mouth 2 (two) times daily as needed for cough. 02/18/17 02/18/18  Little, Traci M, PA-C  cephALEXin (KEFLEX) 500 MG capsule Take 1 capsule (500 mg total) by mouth 3 (three) times daily. 06/12/17   Loleta Rose, MD  ciprofloxacin (CIPRO) 500 MG tablet Take 1 tablet (500 mg total) by mouth 2 (two) times daily for 14 days. 06/15/17 06/29/17  Marcine Matar, MD  fluticasone (FLONASE) 50 MCG/ACT nasal spray Place 2 sprays into both nostrils daily. 12/30/16   Menshew, Charlesetta Ivory, PA-C  ibuprofen (ADVIL,MOTRIN) 800 MG tablet Take 1 tablet (800 mg total) by mouth every 8 (eight) hours as needed. 10/13/15  Ignacia Bayley, PA-C  oxyCODONE-acetaminophen (ROXICET) 5-325 MG tablet Take 1 tablet by mouth every 6 (six) hours as needed. 10/13/15   Ignacia Bayley, PA-C  predniSONE (DELTASONE) 20 MG tablet Take 3 tablets (60 mg total) by mouth daily. 06/17/16 06/17/17  Myrna Blazer, MD  sulfamethoxazole-trimethoprim (BACTRIM DS,SEPTRA DS) 800-160 MG tablet Take 1 tablet by mouth 2 (two) times daily. 04/11/15   Cuthriell, Delorise Royals, PA-C  traMADol (ULTRAM) 50 MG tablet Take 1 tablet (50 mg total) by mouth every 6 (six) hours as needed for moderate pain. 01/02/15   Tommi Rumps, PA-C    Allergies Penicillins  History reviewed.  No pertinent family history.  Social History Social History   Tobacco Use  . Smoking status: Current Every Day Smoker    Packs/day: 0.50    Types: Cigarettes  . Smokeless tobacco: Never Used  Substance Use Topics  . Alcohol use: Yes  . Drug use: No    Comment: heroin.  last used 4-5 days ago.    Review of Systems Constitutional: No fever/chills Eyes: No visual changes. ENT: No sore throat. Cardiovascular: Denies chest pain. Respiratory: Denies shortness of breath. Gastrointestinal: No abdominal pain.  No nausea, no vomiting.  No diarrhea.  No constipation. Genitourinary: See HPI Musculoskeletal: Negative for back pain. Skin: Negative for rash. Neurological: Negative for headaches, focal weakness or numbness.    ____________________________________________   PHYSICAL EXAM:  VITAL SIGNS: ED Triage Vitals [06/15/17 1858]  Enc Vitals Group     BP (!) 155/99     Pulse Rate 86     Resp 18     Temp 97.9 F (36.6 C)     Temp Source Oral     SpO2 100 %     Weight 189 lb (85.7 kg)     Height 6' (1.829 m)     Head Circumference      Peak Flow      Pain Score 10     Pain Loc      Pain Edu?      Excl. in GC?    Constitutional: Alert and oriented. Well appearing and in no acute distress. Eyes: Conjunctivae are normal. Head: Atraumatic. Nose: No congestion/rhinnorhea. Mouth/Throat: Mucous membranes are moist. Neck: No stridor.   Cardiovascular: Normal rate, regular rhythm. Grossly normal heart sounds.  Good peripheral circulation. Respiratory: Normal respiratory effort.  No retractions. Lungs CTAB. Gastrointestinal: Soft and nontender. No distention. Genitourinary: The scrotum and testicles are nontender and nonswollen.  The perineum is normal.  The base of the shaft of the penis is normal in coloration, however the distal portion of the penis where the foreskin would lie does show circumferential erythema and some mild edema as well as somewhat superficial erosion  circumferentially about what would be the base of the foreskin.  There is a phimosis present, but no paraphimosis.  The tip of the glans penis is somewhat hard to see, but does not appear purpuric does appear erythematous  Musculoskeletal: No lower extremity tenderness nor edema. Neurologic:  Normal speech and language. No gross focal neurologic deficits are appreciated.  Skin:  Skin is warm, dry and intact. No rash noted. Psychiatric: Mood and affect are normal. Speech and behavior are normal.  ____________________________________________   LABS (all labs ordered are listed, but only abnormal results are displayed)  Labs Reviewed  URINALYSIS, COMPLETE (UACMP) WITH MICROSCOPIC - Abnormal; Notable for the following components:      Result Value   Color, Urine YELLOW (*)  APPearance CLEAR (*)    Squamous Epithelial / LPF 0-5 (*)    All other components within normal limits  GLUCOSE, CAPILLARY  CBC WITH DIFFERENTIAL/PLATELET  BASIC METABOLIC PANEL  CBG MONITORING, ED   ____________________________________________  EKG   ____________________________________________  RADIOLOGY   ____________________________________________   PROCEDURES  Procedure(s) performed: None  Procedures  Critical Care performed: No  ____________________________________________   INITIAL IMPRESSION / ASSESSMENT AND PLAN / ED COURSE  Pertinent labs & imaging results that were available during my care of the patient were reviewed by me and considered in my medical decision making (see chart for details).    Clinical Course as of Jun 16 2135  Wynelle LinkSun Jun 15, 2017  1941 No paraphimosis.  [MQ]  1954 Paged urology to further discuss.  The patient has no evidence of paraphimosis now, clinical history give sounds as though he had a paraphimosis which she was able to reduce at home.  I am concerned, however and wish to discuss with urology as it sounds like he is performing recommended treatment for  Balanoposthitis and symptoms seem to be worsening despite home therapy.   [MQ]  1957 Phimosis is present.   [MQ]    Clinical Course User Index [MQ] Sharyn CreamerQuale, Meria Crilly, MD    Case discussed with Dr. Janifer Adieahlstead of urology, reports.  Like the opportunity to come see the patient in consult.  ----------------------------------------- 8:41 PM on 06/15/2017 -----------------------------------------  Patient resting in his room, ambulate to the bathroom reports that burns like crazy around his penis when he tries to urinate.  ----------------------------------------- 9:35 PM on 06/15/2017 -----------------------------------------  Patient seen by Dr. Retta Dionesahlstedt of urology.  Patient will be given 2 g of Rocephin, also adding Cipro to his antibiotic regimen.  Dr. Lenn Sinkahlsteadt did advise discharge and follow-up with urology thereafter.  Patient agreeable with plan, will discharge home appears consistent with a case of Balanoposthitis  ____________________________________________   FINAL CLINICAL IMPRESSION(S) / ED DIAGNOSES  Final diagnoses:  Balanoposthitis   Cipro prescription provided by urologist   NEW MEDICATIONS STARTED DURING THIS VISIT:  Current Discharge Medication List    START taking these medications   Details  ciprofloxacin (CIPRO) 500 MG tablet Take 1 tablet (500 mg total) by mouth 2 (two) times daily for 14 days. Qty: 10 tablet, Refills: 0         Note:  This document was prepared using Dragon voice recognition software and may include unintentional dictation errors.     Sharyn CreamerQuale, Kadia Abaya, MD 06/15/17 2137

## 2017-07-18 ENCOUNTER — Emergency Department
Admission: EM | Admit: 2017-07-18 | Discharge: 2017-07-18 | Disposition: A | Discharge status: Home / Self Care | Payer: Self-pay | Attending: Emergency Medicine | Admitting: Emergency Medicine

## 2017-07-18 ENCOUNTER — Encounter: Payer: Self-pay | Admitting: Emergency Medicine

## 2017-07-18 ENCOUNTER — Emergency Department: Payer: Self-pay

## 2017-07-18 DIAGNOSIS — J45901 Unspecified asthma with (acute) exacerbation: Secondary | ICD-10-CM | POA: Insufficient documentation

## 2017-07-18 DIAGNOSIS — F17210 Nicotine dependence, cigarettes, uncomplicated: Secondary | ICD-10-CM | POA: Insufficient documentation

## 2017-07-18 DIAGNOSIS — J4521 Mild intermittent asthma with (acute) exacerbation: Secondary | ICD-10-CM

## 2017-07-18 LAB — CBC
HCT: 48.2 % (ref 40.0–52.0)
Hemoglobin: 16 g/dL (ref 13.0–18.0)
MCH: 31.2 pg (ref 26.0–34.0)
MCHC: 33.1 g/dL (ref 32.0–36.0)
MCV: 94.3 fL (ref 80.0–100.0)
PLATELETS: 212 10*3/uL (ref 150–440)
RBC: 5.11 MIL/uL (ref 4.40–5.90)
RDW: 13.3 % (ref 11.5–14.5)
WBC: 6 10*3/uL (ref 3.8–10.6)

## 2017-07-18 LAB — BASIC METABOLIC PANEL
Anion gap: 7 (ref 5–15)
BUN: 18 mg/dL (ref 6–20)
CO2: 31 mmol/L (ref 22–32)
CREATININE: 1.01 mg/dL (ref 0.61–1.24)
Calcium: 9.1 mg/dL (ref 8.9–10.3)
Chloride: 100 mmol/L — ABNORMAL LOW (ref 101–111)
GFR calc Af Amer: >60 mL/min (ref >60)
Glucose, Bld: 103 mg/dL — ABNORMAL HIGH (ref 65–99)
Potassium: 4.6 mmol/L (ref 3.5–5.1)
SODIUM: 138 mmol/L (ref 135–145)

## 2017-07-18 LAB — TROPONIN I: Troponin I: <0.03 ng/mL (ref <0.03)

## 2017-07-18 MED ORDER — PREDNISONE 10 MG (21) PO TBPK: ORAL_TABLET | Freq: Every day | ORAL | 0 refills | Status: DC

## 2017-07-18 MED ORDER — IPRATROPIUM-ALBUTEROL 0.5-2.5 (3) MG/3ML IN SOLN
RESPIRATORY_TRACT | Status: AC
  Administered 2017-07-18: 3 mL via RESPIRATORY_TRACT
  Filled 2017-07-18: qty 3

## 2017-07-18 MED ORDER — PREDNISONE 20 MG PO TABS
ORAL_TABLET | ORAL | Status: AC
  Filled 2017-07-18: qty 3

## 2017-07-18 MED ORDER — PREDNISONE 20 MG PO TABS
60.0000 mg | ORAL_TABLET | Freq: Once | ORAL | Status: AC
  Administered 2017-07-18: 60 mg via ORAL

## 2017-07-18 MED ORDER — IPRATROPIUM-ALBUTEROL 0.5-2.5 (3) MG/3ML IN SOLN
3.0000 mL | Freq: Once | RESPIRATORY_TRACT | Status: AC
Start: 2017-07-18 — End: 2017-07-18
  Administered 2017-07-18: 3 mL via RESPIRATORY_TRACT

## 2017-07-18 MED ORDER — ALBUTEROL SULFATE (2.5 MG/3ML) 0.083% IN NEBU: 2.5000 mg | INHALATION_SOLUTION | Freq: Four times a day (QID) | RESPIRATORY_TRACT | 12 refills | Status: DC | PRN

## 2017-07-18 MED ORDER — ALBUTEROL SULFATE HFA 108 (90 BASE) MCG/ACT IN AERS: 2.0000 | INHALATION_SPRAY | Freq: Four times a day (QID) | RESPIRATORY_TRACT | 2 refills | Status: DC | PRN

## 2017-07-18 NOTE — ED Triage Notes (Signed)
First Nurse Note:  C/O sob x 1 week.    Patient is AAOx3.  Skin warm and dry.  No SOB/ DOE.  NAD

## 2017-07-18 NOTE — ED Triage Notes (Addendum)
Pt comes into the ED via PV c/o shortness of breath x 1 week.  Patient states he is an asthma patient and has run out of all his medications and breathing treatments at home.  Patient states the shortness of breath is so bad at times that he feels as though he is going to pass out and it is causing him to have heavy chest pain.  Patient has even and unlabored respirations at this time and in NAD.

## 2017-07-18 NOTE — ED Notes (Signed)
Pt states it feels hard to breathe. Pt is to have taken NEB tx but have been with out for over a mth

## 2017-07-18 NOTE — ED Provider Notes (Signed)
[facility] Emergency Department Provider Note       Time seen: ----------------------------------------- 4:02 PM on 07/18/2017 -----------------------------------------   I have reviewed the triage vital signs and the nursing notes.  HISTORY   Chief Complaint Shortness of Breath and Chest Pain    HPI Nathan Vasquez is a 37 y.o. male with a history of asthma, hep C, heroin abuse who presents to the ED for shortness of breath for the past week.  Patient states he ran out of his albuterol inhaler about a month ago.  He also states he staying in a new environment and he feels like it may be infested with cockroaches.  Patient states he feels so short of breath at times he feels like he may pass out he sometimes has chest pain.  Past Medical History:  Diagnosis Date  . Asthma   . Hep C w/o coma, chronic (HCC)   . Heroin abuse (HCC)   . History of balanitis     There are no active problems to display for this patient.   History reviewed. No pertinent surgical history.  Allergies Penicillins  Social History Social History   Tobacco Use  . Smoking status: Current Every Day Smoker    Packs/day: 0.50    Types: Cigarettes  . Smokeless tobacco: Never Used  Substance Use Topics  . Alcohol use: Yes  . Drug use: No    Comment: heroin.  last used 4-5 days ago.    Review of Systems Constitutional: Negative for fever. Cardiovascular: Positive for chest pain Respiratory: Positive for shortness of breath Gastrointestinal: Negative for abdominal pain, vomiting and diarrhea. Genitourinary: Negative for dysuria. Musculoskeletal: Negative for back pain. Skin: Negative for rash. Neurological: Negative for headaches, focal weakness or numbness.  All systems negative/normal/unremarkable except as stated in the HPI  ____________________________________________   PHYSICAL EXAM:  VITAL SIGNS: ED Triage Vitals [07/18/17 1237]  Enc Vitals Group      BP      Pulse      Resp      Temp      Temp src      SpO2      Weight 190 lb (86.2 kg)     Height 6' (1.829 m)     Head Circumference      Peak Flow      Pain Score 8     Pain Loc      Pain Edu?      Excl. in GC?     Constitutional: Alert and oriented. Well appearing and in no distress. Eyes: Conjunctivae are normal. Normal extraocular movements. ENT   Head: Normocephalic and atraumatic.   Nose: No congestion/rhinnorhea.   Mouth/Throat: Mucous membranes are moist.   Neck: No stridor. Cardiovascular: Normal rate, regular rhythm. No murmurs, rubs, or gallops. Respiratory: Mild wheezing, no distress Gastrointestinal: Soft and nontender. Normal bowel sounds Musculoskeletal: Nontender with normal range of motion in extremities. No lower extremity tenderness nor edema. Neurologic:  Normal speech and language. No gross focal neurologic deficits are appreciated.  Skin:  Skin is warm, dry and intact. No rash noted. Psychiatric: Mood and affect are normal. Speech and behavior are normal.  ____________________________________________  EKG: Interpreted by me.  Sinus rhythm the rate of 91 bpm, no but normal QRS, normal QT.  ____________________________________________  ED COURSE:  As part of my medical decision making, I reviewed the following data within the electronic MEDICAL RECORD NUMBER History obtained from family if available, nursing notes, old  chart and ekg, as well as notes from prior ED visits. Patient presented for asthma exacerbation, we will assess with labs and imaging as indicated at this time.   Procedures ____________________________________________   LABS (pertinent positives/negatives)  Labs Reviewed  BASIC METABOLIC PANEL - Abnormal; Notable for the following components:      Result Value   Chloride 100 (*)    Glucose, Bld 103 (*)    All other components within normal limits  CBC  TROPONIN I    RADIOLOGY  Chest x-ray is  normal  ____________________________________________  DIFFERENTIAL DIAGNOSIS   Asthma, pneumonia, pneumothorax, PE, URI, influenza  FINAL ASSESSMENT AND PLAN  Asthma exacerbation   Plan: Patient had presented for asthma exacerbation and medication refill. Patient's labs are unremarkable. Patient's imaging is also unremarkable.  He will be discharged with albuterol and a short course of steroids.  He is stable for outpatient follow-up.   Ulice DashJohnathan E Lott Seelbach, MD   Note: This note was generated in part or whole with voice recognition software. Voice recognition is usually quite accurate but there are transcription errors that can and very often do occur. I apologize for any typographical errors that were not detected and corrected.     Emily FilbertWilliams, Ajanay Farve E, MD 07/18/17 [phone]

## 2017-09-13 ENCOUNTER — Emergency Department: Payer: Self-pay

## 2017-09-13 ENCOUNTER — Emergency Department
Admission: EM | Admit: 2017-09-13 | Discharge: 2017-09-13 | Disposition: A | Discharge status: Home / Self Care | Payer: Self-pay | Attending: Emergency Medicine | Admitting: Emergency Medicine

## 2017-09-13 ENCOUNTER — Other Ambulatory Visit: Payer: Self-pay

## 2017-09-13 DIAGNOSIS — Z79899 Other long term (current) drug therapy: Secondary | ICD-10-CM | POA: Insufficient documentation

## 2017-09-13 DIAGNOSIS — F17210 Nicotine dependence, cigarettes, uncomplicated: Secondary | ICD-10-CM | POA: Insufficient documentation

## 2017-09-13 DIAGNOSIS — J441 Chronic obstructive pulmonary disease with (acute) exacerbation: Secondary | ICD-10-CM | POA: Insufficient documentation

## 2017-09-13 DIAGNOSIS — R0602 Shortness of breath: Secondary | ICD-10-CM

## 2017-09-13 LAB — BASIC METABOLIC PANEL
Anion gap: 2 — ABNORMAL LOW (ref 5–15)
BUN: 12 mg/dL (ref 6–20)
CO2: 34 mmol/L — ABNORMAL HIGH (ref 22–32)
CREATININE: 0.89 mg/dL (ref 0.61–1.24)
Calcium: 8.5 mg/dL — ABNORMAL LOW (ref 8.9–10.3)
Chloride: 102 mmol/L (ref 101–111)
GFR calc Af Amer: >60 mL/min (ref >60)
GFR calc non Af Amer: >60 mL/min (ref >60)
GLUCOSE: 73 mg/dL (ref 65–99)
Potassium: 3.9 mmol/L (ref 3.5–5.1)
SODIUM: 138 mmol/L (ref 135–145)

## 2017-09-13 LAB — FIBRIN DERIVATIVES D-DIMER (ARMC ONLY): FIBRIN DERIVATIVES D-DIMER (ARMC): 304.48 ng{FEU}/mL (ref 0.00–499.00)

## 2017-09-13 LAB — CBC
HCT: 42.1 % (ref 40.0–52.0)
Hemoglobin: 14.4 g/dL (ref 13.0–18.0)
MCH: 31.7 pg (ref 26.0–34.0)
MCHC: 34.2 g/dL (ref 32.0–36.0)
MCV: 92.7 fL (ref 80.0–100.0)
PLATELETS: 129 10*3/uL — AB (ref 150–440)
RBC: 4.54 MIL/uL (ref 4.40–5.90)
RDW: 13.2 % (ref 11.5–14.5)
WBC: 5.9 10*3/uL (ref 3.8–10.6)

## 2017-09-13 LAB — TROPONIN I: Troponin I: <0.03 ng/mL (ref <0.03)

## 2017-09-13 MED ORDER — IPRATROPIUM-ALBUTEROL 0.5-2.5 (3) MG/3ML IN SOLN
3.0000 mL | Freq: Once | RESPIRATORY_TRACT | Status: AC
  Administered 2017-09-13: 3 mL via RESPIRATORY_TRACT
  Filled 2017-09-13: qty 3

## 2017-09-13 MED ORDER — PREDNISONE 20 MG PO TABS: 40.0000 mg | ORAL_TABLET | Freq: Every day | ORAL | 0 refills | Status: DC

## 2017-09-13 MED ORDER — AZITHROMYCIN 250 MG PO TABS: ORAL_TABLET | ORAL | 0 refills | Status: DC

## 2017-09-13 MED ORDER — ALBUTEROL SULFATE (2.5 MG/3ML) 0.083% IN NEBU
5.0000 mg | INHALATION_SOLUTION | Freq: Once | RESPIRATORY_TRACT | Status: AC
  Administered 2017-09-13: 5 mg via RESPIRATORY_TRACT
  Filled 2017-09-13: qty 6

## 2017-09-13 MED ORDER — METHYLPREDNISOLONE SODIUM SUCC 125 MG IJ SOLR
125.0000 mg | Freq: Once | INTRAMUSCULAR | Status: AC
  Administered 2017-09-13: 125 mg via INTRAVENOUS
  Filled 2017-09-13: qty 2

## 2017-09-13 MED ORDER — ALBUTEROL SULFATE HFA 108 (90 BASE) MCG/ACT IN AERS: 2.0000 | INHALATION_SPRAY | Freq: Four times a day (QID) | RESPIRATORY_TRACT | 2 refills | Status: DC | PRN

## 2017-09-13 NOTE — ED Provider Notes (Signed)
[facility] Emergency Department Provider Note  ____________________________________________   First MD Initiated Contact with Patient 09/13/17 1741     (approximate)  I have reviewed the triage vital signs and the nursing notes.   HISTORY  Chief Complaint Shortness of Breath    HPI Nathan Vasquez is a 37 y.o. male presents emergency department complaining of chest pain.  He states he is having a lot of wheezing and is having difficulty going up steps.  He states he feels like an elephant is sitting on his chest when he gets to the top of the steps.  States he does have a mild amount of pain but is more worried about the shortness of breath.  In triage she states it felt like a panic attack.  Patient has a history of IV drug use.  States he did overdose on heroin about a year ago.  He states that now he snorts heroin.  He states he did use a few days ago.  He denies that he has had any fever chills although he did feel hot while at home.  He has not had a productive cough.  Past Medical History:  Diagnosis Date  . Asthma   . Hep C w/o coma, chronic (HCC)   . Heroin abuse (HCC)   . History of balanitis     There are no active problems to display for this patient.   History reviewed. No pertinent surgical history.  Prior to Admission medications   Medication Sig Start Date End Date Taking? Authorizing Provider  albuterol (PROVENTIL HFA;VENTOLIN HFA) 108 (90 Base) MCG/ACT inhaler Inhale 2 puffs into the lungs every 6 (six) hours as needed for wheezing or shortness of breath. 07/18/17   Emily Filbert, MD  albuterol (PROVENTIL) (2.5 MG/3ML) 0.083% nebulizer solution Take 3 mLs (2.5 mg total) by nebulization every 6 (six) hours as needed for wheezing or shortness of breath. 06/08/16   Jeanmarie Plant, MD  albuterol (PROVENTIL) (2.5 MG/3ML) 0.083% nebulizer solution Take 3 mLs (2.5 mg total) by nebulization every 6 (six) hours as needed for  wheezing or shortness of breath. 07/18/17   Emily Filbert, MD  albuterol-ipratropium (COMBIVENT) 18-103 MCG/ACT inhaler Inhale 2 puffs into the lungs 4 (four) times daily as needed for wheezing. 06/08/16 06/08/17  Jeanmarie Plant, MD  benzonatate (TESSALON PERLES) 100 MG capsule Take 1 capsule (100 mg total) by mouth 2 (two) times daily as needed for cough. 02/18/17 02/18/18  Little, Traci M, PA-C  cephALEXin (KEFLEX) 500 MG capsule Take 1 capsule (500 mg total) by mouth 3 (three) times daily. 06/12/17   Loleta Rose, MD  fluticasone (FLONASE) 50 MCG/ACT nasal spray Place 2 sprays into both nostrils daily. 12/30/16   Menshew, Charlesetta Ivory, PA-C  ibuprofen (ADVIL,MOTRIN) 800 MG tablet Take 1 tablet (800 mg total) by mouth every 8 (eight) hours as needed. 10/13/15   Ignacia Bayley, PA-C  oxyCODONE-acetaminophen (ROXICET) 5-325 MG tablet Take 1 tablet by mouth every 6 (six) hours as needed. 10/13/15   Ignacia Bayley, PA-C  predniSONE (STERAPRED UNI-PAK 21 TAB) 10 MG (21) TBPK tablet Take by mouth daily. Dispense steroid taper pack as directed 07/18/17   Emily Filbert, MD  sulfamethoxazole-trimethoprim (BACTRIM DS,SEPTRA DS) 800-160 MG tablet Take 1 tablet by mouth 2 (two) times daily. 04/11/15   Cuthriell, Delorise Royals, PA-C  traMADol (ULTRAM) 50 MG tablet Take 1 tablet (50 mg total) by mouth every 6 (six) hours as needed for  moderate pain. 01/02/15   Tommi RumpsSummers, Rhonda L, PA-C    Allergies Penicillins  No family history on file.  Social History Social History   Tobacco Use  . Smoking status: Current Every Day Smoker    Packs/day: 0.50    Types: Cigarettes  . Smokeless tobacco: Never Used  Substance Use Topics  . Alcohol use: Never    Frequency: Never  . Drug use: Yes    Comment: heroin.  last used 2 days ago.    Review of Systems  Constitutional: Unsure of fever/chills Eyes: No visual changes. ENT: No sore throat. Respiratory: Positive for shortness of breath and mild  cough Cardiovascular: Complains of some chest pain unsure if it is a panic attack but feels like an elephant is sitting on his chest Genitourinary: Negative for dysuria. Musculoskeletal: Negative for back pain. Skin: Negative for rash.    ____________________________________________   PHYSICAL EXAM:  VITAL SIGNS: ED Triage Vitals  Enc Vitals Group     BP 09/13/17 1655 (!) 148/82     Pulse Rate 09/13/17 1655 83     Resp 09/13/17 1655 16     Temp 09/13/17 1655 (!) 97.5 F (36.4 C)     Temp Source 09/13/17 1655 Oral     SpO2 09/13/17 1655 96 %     Weight 09/13/17 1656 196 lb (88.9 kg)     Height 09/13/17 1656 6' (1.829 m)     Head Circumference --      Peak Flow --      Pain Score 09/13/17 1656 0     Pain Loc --      Pain Edu? --      Excl. in GC? --     Constitutional: Alert and oriented. Well appearing and in no acute distress.  Appears minimally anxious Eyes: Conjunctivae are normal.  Head: Atraumatic. Nose: No congestion/rhinnorhea. Mouth/Throat: Mucous membranes are moist.   Cardiovascular: Normal rate, regular rhythm.  Heart sounds are normal Respiratory: Normal respiratory effort.  No retractions, positive wheezing in the upper lung fields GU: deferred Musculoskeletal: FROM all extremities, warm and well perfused Neurologic:  Normal speech and language.  Skin:  Skin is warm, dry and intact. No rash noted. Psychiatric: Mood and affect are normal. Speech and behavior are normal.  ____________________________________________   LABS (all labs ordered are listed, but only abnormal results are displayed)  Labs Reviewed  BASIC METABOLIC PANEL - Abnormal; Notable for the following components:      Result Value   CO2 34 (*)    Calcium 8.5 (*)    Anion gap 2 (*)    All other components within normal limits  CBC - Abnormal; Notable for the following components:   Platelets 129 (*)    All other components within normal limits  TROPONIN I  FIBRIN DERIVATIVES  D-DIMER ([facility] ONLY)   ____________________________________________   ____________________________________________  RADIOLOGY  Chest x-ray ordered  ____________________________________________   PROCEDURES  Procedure(s) performed: no  Procedures    ____________________________________________   INITIAL IMPRESSION / ASSESSMENT AND PLAN / ED COURSE  Pertinent labs & imaging results that were available during my care of the patient were reviewed by me and considered in my medical decision making (see chart for details).  Patient is a 37 year old male presents to the emergency department with difficulty breathing and some chest pain.  He states that he feels like an elephant is sitting on his chest.  Patient has a history of IV drug use.  States he  is not used heroin IV for at least one year but he has been snorting heroin.  He does admit to recent use in the past few days.  He denies any fever or chills at this time.  On physical exam patient does appear mildly anxious.  Lungs with wheezing in the upper lung fields.  Heart sounds are normal.  Orders for EKG, chest x-ray, DuoNeb, labs including troponin were ordered.  Due to the patient's history and need for being on a monitor, he was transferred to the major side of the emergency department.  Report was given to Dr. Scotty Court.   Clinical Course as of Sep 13 1801  Sat Sep 13, 2017  1757 NEEDS NOTE FOR SCHOOL ON DC. MISSED W-F THIS WEEK.   [PS]    Clinical Course User Index [PS] Sharman Cheek, MD    As part of my medical decision making, I reviewed the following data within the electronic MEDICAL RECORD NUMBER Nursing notes reviewed and incorporated, Old chart reviewed, Patient signed out to Dr. Scotty Court, Notes from prior ED visits and [place] Controlled Substance Database  ____________________________________________   FINAL CLINICAL IMPRESSION(S) / ED DIAGNOSES  Final diagnoses:  Shortness of breath      NEW  MEDICATIONS STARTED DURING THIS VISIT:  New Prescriptions   No medications on file     Note:  This document was prepared using Dragon voice recognition software and may include unintentional dictation errors.    Faythe Ghee, PA-C 09/13/17 Aldona Lento    Sharman Cheek, MD 09/13/17 2021

## 2017-09-13 NOTE — ED Triage Notes (Signed)
Pt has hx of asthma - he states for the last week he has been wheezing and having difficulty walking up stairs without becoming short of breath - pt states that he feels like he is having a panic attack - NAD noted

## 2017-09-13 NOTE — ED Notes (Signed)
Reviewed discharge instructions, follow-up care, and prescriptions with patient. Patient verbalized understanding of all information reviewed. Patient stable, with no distress noted at this time.    

## 2017-09-13 NOTE — ED Provider Notes (Signed)
[facility] Emergency Department Provider Note  ____________________________________________  Time seen: Approximately 8:22 PM  I have reviewed the triage vital signs and the nursing notes.   HISTORY  Chief Complaint Shortness of Breath    HPI Nathan Vasquez is a 37 y.o. male with a history of COPD, seen by me and the physician assistant Greig Right. He reports gradual onset shortness of breath for the past month, worse with exertion,  better with albuterol which she has now run out of. Denies fever chills or sweats. Denies chest pain. No history of travel trauma hospitalization and surgery DVT PE. Shortness of breath is moderate severity and constant.      Past Medical History:  Diagnosis Date  . Asthma   . Hep C w/o coma, chronic (HCC)   . Heroin abuse (HCC)   . History of balanitis      There are no active problems to display for this patient.    History reviewed. No pertinent surgical history.   Prior to Admission medications   Medication Sig Start Date End Date Taking? Authorizing Provider  albuterol (PROVENTIL HFA;VENTOLIN HFA) 108 (90 Base) MCG/ACT inhaler Inhale 2 puffs into the lungs every 6 (six) hours as needed for wheezing or shortness of breath. 09/13/17   Sharman Cheek, MD  albuterol (PROVENTIL) (2.5 MG/3ML) 0.083% nebulizer solution Take 3 mLs (2.5 mg total) by nebulization every 6 (six) hours as needed for wheezing or shortness of breath. 07/18/17   Emily Filbert, MD  albuterol-ipratropium (COMBIVENT) 18-103 MCG/ACT inhaler Inhale 2 puffs into the lungs 4 (four) times daily as needed for wheezing. 06/08/16 06/08/17  Jeanmarie Plant, MD  azithromycin (ZITHROMAX Z-PAK) 250 MG tablet Take 2 tablets (500 mg) on  Day 1,  followed by 1 tablet (250 mg) once daily on Days 2 through 5. 09/13/17   Sharman Cheek, MD  benzonatate (TESSALON PERLES) 100 MG capsule Take 1 capsule (100 mg total) by mouth 2 (two) times daily as  needed for cough. 02/18/17 02/18/18  Little, Traci M, PA-C  cephALEXin (KEFLEX) 500 MG capsule Take 1 capsule (500 mg total) by mouth 3 (three) times daily. 06/12/17   Loleta Rose, MD  fluticasone (FLONASE) 50 MCG/ACT nasal spray Place 2 sprays into both nostrils daily. 12/30/16   Menshew, Charlesetta Ivory, PA-C  ibuprofen (ADVIL,MOTRIN) 800 MG tablet Take 1 tablet (800 mg total) by mouth every 8 (eight) hours as needed. 10/13/15   Ignacia Bayley, PA-C  oxyCODONE-acetaminophen (ROXICET) 5-325 MG tablet Take 1 tablet by mouth every 6 (six) hours as needed. 10/13/15   Ignacia Bayley, PA-C  predniSONE (DELTASONE) 20 MG tablet Take 2 tablets (40 mg total) by mouth daily. 09/13/17   Sharman Cheek, MD  sulfamethoxazole-trimethoprim (BACTRIM DS,SEPTRA DS) 800-160 MG tablet Take 1 tablet by mouth 2 (two) times daily. 04/11/15   Cuthriell, Delorise Royals, PA-C  traMADol (ULTRAM) 50 MG tablet Take 1 tablet (50 mg total) by mouth every 6 (six) hours as needed for moderate pain. 01/02/15   Tommi Rumps, PA-C     Allergies Penicillins   No family history on file.  Social History Social History   Tobacco Use  . Smoking status: Current Every Day Smoker    Packs/day: 0.50    Types: Cigarettes  . Smokeless tobacco: Never Used  Substance Use Topics  . Alcohol use: Never    Frequency: Never  . Drug use: Yes    Comment: heroin.  last used 2 days ago.  Review of Systems  Constitutional:   No fever or chills.  ENT:   No sore throat. No rhinorrhea. Cardiovascular:   No chest pain or syncope. Respiratory:   positive as above shortness of breath and nonproductive cough. Gastrointestinal:   Negative for abdominal pain, vomiting and diarrhea.  Musculoskeletal:   Negative for focal pain or swelling All other systems reviewed and are negative except as documented above in ROS and HPI.  ____________________________________________   PHYSICAL EXAM:  VITAL SIGNS: ED Triage Vitals  Enc Vitals Group      BP 09/13/17 1655 (!) 148/82     Pulse Rate 09/13/17 1655 83     Resp 09/13/17 1655 16     Temp 09/13/17 1655 (!) 97.5 F (36.4 C)     Temp Source 09/13/17 1655 Oral     SpO2 09/13/17 1655 96 %     Weight 09/13/17 1656 196 lb (88.9 kg)     Height 09/13/17 1656 6' (1.829 m)     Head Circumference --      Peak Flow --      Pain Score 09/13/17 1656 0     Pain Loc --      Pain Edu? --      Excl. in GC? --     Vital signs reviewed, nursing assessments reviewed.   Constitutional:   Alert and oriented. Well appearing and in no distress.anxious appearing Eyes:   Conjunctivae are normal. EOMI. PERRL. ENT      Head:   Normocephalic and atraumatic.      Nose:   No congestion/rhinnorhea.       Mouth/Throat:   MMM, no pharyngeal erythema. No peritonsillar mass.       Neck:   No meningismus. Full ROM. Hematological/Lymphatic/Immunilogical:   No cervical lymphadenopathy. Cardiovascular:   RRR. Symmetric bilateral radial and DP pulses.  No murmurs.  Respiratory:   Normal respiratory effort without tachypnea/retractions. diffuse expiratory wheezing, normal expiratory phase. No focal crackles. Good air entry in all lung fields. Gastrointestinal:   Soft and nontender. Non distended. There is no CVA tenderness.  No rebound, rigidity, or guarding. Genitourinary:   deferred Musculoskeletal:   Normal range of motion in all extremities. No joint effusions.  No lower extremity tenderness.  No edema. Neurologic:   Normal speech and language.  Motor grossly intact. No acute focal neurologic deficits are appreciated.  Skin:    Skin is warm, dry and intact. No rash noted.  No petechiae, purpura, or bullae.  ____________________________________________    LABS (pertinent positives/negatives) (all labs ordered are listed, but only abnormal results are displayed) Labs Reviewed  BASIC METABOLIC PANEL - Abnormal; Notable for the following components:      Result Value   CO2 34 (*)    Calcium 8.5 (*)     Anion gap 2 (*)    All other components within normal limits  CBC - Abnormal; Notable for the following components:   Platelets 129 (*)    All other components within normal limits  TROPONIN I  FIBRIN DERIVATIVES D-DIMER ([facility] ONLY)   ____________________________________________   EKG interpreted by me  Date: 09/13/2017  Rate: 58  Rhythm: normal sinus rhythm  QRS Axis: normal  Intervals: normal  ST/T Wave abnormalities: normal  Conduction Disutrbances: none  Narrative Interpretation: unremarkable       ____________________________________________    RADIOLOGY  Dg Chest 2 View  Result Date: 09/13/2017 CLINICAL DATA:  Chest pain and difficulty breathing EXAM: CHEST - 2 VIEW  COMPARISON:  07/18/2017 FINDINGS: The heart size and mediastinal contours are within normal limits. Both lungs are hyperinflated. The visualized skeletal structures are unremarkable. IMPRESSION: COPD without acute abnormality. Electronically Signed   By: Alcide Clever M.D.   On: 09/13/2017 17:37    ____________________________________________   PROCEDURES Procedures  ____________________________________________  DIFFERENTIAL DIAGNOSIS   COPD exacerbation, pneumonia, low suspicion PE.  CLINICAL IMPRESSION / ASSESSMENT AND PLAN / ED COURSE  Pertinent labs & imaging results that were available during my care of the patient were reviewed by me and considered in my medical decision making (see chart for details).    highly doubt ACS dissection AAA. Patient is not septic. Patient presents with shortness of breath for the past month, will give bronchodilators and steroids, reassess.  Clinical Course as of Sep 13 2020  Sat Sep 13, 2017  1757 NEEDS NOTE FOR SCHOOL ON DC. MISSED W-F THIS WEEK.   [PS]  1928 D-dimer neg. Plan to DC home with rx for prednisone, albuterol refill.   Fibrin derivatives D-dimer [facility]): 304.48 [PS]  2015 Pt ambulatory, not in distress. Reassessed. CTAB. D dimer negative.  Advised pt I would rx prednisone, refill albuterol, start azithromycin.  However, he was observed to walk out of ED with IV in his arm by [employer] operational officer. Appears to have eloped. Staff will attempt to locate pt to remove IV.    [PS]    Clinical Course User Index [PS] Sharman Cheek, MD     ----------------------------------------- 8:28 PM on 09/13/2017 -----------------------------------------  Patient located. IV removed, given prescriptions and discharge instructions. Not in distress, follow up PCP.  ____________________________________________   FINAL CLINICAL IMPRESSION(S) / ED DIAGNOSES    Final diagnoses:  Shortness of breath  COPD exacerbation [facility])     ED Discharge Orders        Ordered    azithromycin (ZITHROMAX Z-PAK) 250 MG tablet     09/13/17 2020    predniSONE (DELTASONE) 20 MG tablet  Daily     09/13/17 2020    albuterol (PROVENTIL HFA;VENTOLIN HFA) 108 (90 Base) MCG/ACT inhaler  Every 6 hours PRN     09/13/17 2020      Portions of this note were generated with dragon dictation software. Dictation errors may occur despite best attempts at proofreading.    Sharman Cheek, MD 09/13/17 2028

## 2017-09-13 NOTE — ED Notes (Signed)
Removed 20 gauge PIV from right AC. Catheter tip intact. Site clean, dry and intact.

## 2017-09-16 ENCOUNTER — Emergency Department: Payer: Self-pay

## 2017-09-16 ENCOUNTER — Emergency Department
Admission: EM | Admit: 2017-09-16 | Discharge: 2017-09-16 | Disposition: A | Discharge status: Home / Self Care | Payer: Self-pay | Attending: Emergency Medicine | Admitting: Emergency Medicine

## 2017-09-16 ENCOUNTER — Encounter: Payer: Self-pay | Admitting: Emergency Medicine

## 2017-09-16 ENCOUNTER — Other Ambulatory Visit: Payer: Self-pay

## 2017-09-16 DIAGNOSIS — J441 Chronic obstructive pulmonary disease with (acute) exacerbation: Secondary | ICD-10-CM | POA: Insufficient documentation

## 2017-09-16 DIAGNOSIS — F17210 Nicotine dependence, cigarettes, uncomplicated: Secondary | ICD-10-CM | POA: Insufficient documentation

## 2017-09-16 LAB — COMPREHENSIVE METABOLIC PANEL
ALBUMIN: 3.6 g/dL (ref 3.5–5.0)
ALK PHOS: 74 U/L (ref 38–126)
ALT: 31 U/L (ref 17–63)
AST: 24 U/L (ref 15–41)
Anion gap: 7 (ref 5–15)
BUN: 12 mg/dL (ref 6–20)
CALCIUM: 8.4 mg/dL — AB (ref 8.9–10.3)
CO2: 26 mmol/L (ref 22–32)
CREATININE: 0.84 mg/dL (ref 0.61–1.24)
Chloride: 98 mmol/L — ABNORMAL LOW (ref 101–111)
GFR calc Af Amer: >60 mL/min (ref >60)
GFR calc non Af Amer: >60 mL/min (ref >60)
GLUCOSE: 105 mg/dL — AB (ref 65–99)
Potassium: 3.3 mmol/L — ABNORMAL LOW (ref 3.5–5.1)
SODIUM: 131 mmol/L — AB (ref 135–145)
Total Bilirubin: 0.6 mg/dL (ref 0.3–1.2)
Total Protein: 6 g/dL — ABNORMAL LOW (ref 6.5–8.1)

## 2017-09-16 LAB — CBC WITH DIFFERENTIAL/PLATELET
BASOS PCT: 1 %
Basophils Absolute: 0 10*3/uL (ref 0–0.1)
Eosinophils Absolute: 0 10*3/uL (ref 0–0.7)
Eosinophils Relative: 0 %
HEMATOCRIT: 41.3 % (ref 40.0–52.0)
Hemoglobin: 13.9 g/dL (ref 13.0–18.0)
LYMPHS ABS: 0.9 10*3/uL — AB (ref 1.0–3.6)
Lymphocytes Relative: 11 %
MCH: 30.8 pg (ref 26.0–34.0)
MCHC: 33.6 g/dL (ref 32.0–36.0)
MCV: 91.6 fL (ref 80.0–100.0)
MONO ABS: 0.4 10*3/uL (ref 0.2–1.0)
MONOS PCT: 5 %
NEUTROS ABS: 7.2 10*3/uL — AB (ref 1.4–6.5)
Neutrophils Relative %: 83 %
Platelets: 125 10*3/uL — ABNORMAL LOW (ref 150–440)
RBC: 4.51 MIL/uL (ref 4.40–5.90)
RDW: 12.9 % (ref 11.5–14.5)
WBC: 8.5 10*3/uL (ref 3.8–10.6)

## 2017-09-16 LAB — TROPONIN I
Troponin I: <0.03 ng/mL (ref <0.03)
Troponin I: <0.03 ng/mL (ref <0.03)

## 2017-09-16 LAB — FIBRIN DERIVATIVES D-DIMER (ARMC ONLY): FIBRIN DERIVATIVES D-DIMER (ARMC): 257.62 ng{FEU}/mL (ref 0.00–499.00)

## 2017-09-16 MED ORDER — IPRATROPIUM-ALBUTEROL 0.5-2.5 (3) MG/3ML IN SOLN
3.0000 mL | Freq: Once | RESPIRATORY_TRACT | Status: AC
  Administered 2017-09-16: 3 mL via RESPIRATORY_TRACT
  Filled 2017-09-16: qty 3

## 2017-09-16 MED ORDER — ALBUTEROL SULFATE 1.25 MG/3ML IN NEBU: 1.0000 | INHALATION_SOLUTION | Freq: Four times a day (QID) | RESPIRATORY_TRACT | 12 refills | Status: DC | PRN

## 2017-09-16 NOTE — ED Notes (Signed)
First Nurse Note:  Patient seen here Sat. With same complaint.  Complaining of chest pain and SHOB.  States he is feeling no better.

## 2017-09-16 NOTE — ED Triage Notes (Signed)
Pt to ed with c/o chest pain that is worse with activity.  Also reports sob worse with activity.  Seen here on Saturday for same.

## 2017-09-16 NOTE — ED Notes (Signed)
Patient states he has not been taking the prednisone he was given a prescription for when he was here a few days ago.

## 2017-09-16 NOTE — ED Provider Notes (Signed)
[facility] Emergency Department Provider Note   ____________________________________________   First MD Initiated Contact with Patient 09/16/17 1017     (approximate)  I have reviewed the triage vital signs and the nursing notes.   HISTORY  Chief Complaint Chest Pain   HPI Nathan Vasquez is a 37 y.o. male he reports he was here a few days ago diagnosed with a COPD exacerbation he was not using his inhaler or his nebulizer.ed10 Began getting short of breath and tight in the chest again was walking up the stairs and got very short of breath and had to sit down from it before he could proceed.  On arrival here he was wheezing a lot and improved rapidly and dramatically once he had nebulizer treatment.   Past Medical History:  Diagnosis Date  . Asthma   . Hep C w/o coma, chronic (HCC)   . Heroin abuse (HCC)   . History of balanitis     There are no active problems to display for this patient.   History reviewed. No pertinent surgical history.  Prior to Admission medications   Medication Sig Start Date End Date Taking? Authorizing Provider  albuterol (PROVENTIL HFA;VENTOLIN HFA) 108 (90 Base) MCG/ACT inhaler Inhale 2 puffs into the lungs every 6 (six) hours as needed for wheezing or shortness of breath. 09/13/17  Yes Sharman Cheek, MD  azithromycin (ZITHROMAX Z-PAK) 250 MG tablet Take 2 tablets (500 mg) on  Day 1,  followed by 1 tablet (250 mg) once daily on Days 2 through 5. 09/13/17  Yes Sharman Cheek, MD  buprenorphine-naloxone (SUBOXONE) 8-2 mg SUBL SL tablet Place 1 tablet under the tongue 2 (two) times daily.   Yes [provider]  DULoxetine (CYMBALTA) 60 MG capsule Take 60 mg by mouth daily.   Yes [provider]  albuterol (ACCUNEB) 1.25 MG/3ML nebulizer solution Take 3 mLs (1.25 mg total) by nebulization every 6 (six) hours as needed for wheezing. 09/16/17   Arnaldo Natal, MD  albuterol (PROVENTIL) (2.5 MG/3ML)  0.083% nebulizer solution Take 3 mLs (2.5 mg total) by nebulization every 6 (six) hours as needed for wheezing or shortness of breath. Patient not taking: Reported on 09/16/2017 07/18/17   Emily Filbert, MD  albuterol-ipratropium [facility]) [phone] MCG/ACT inhaler Inhale 2 puffs into the lungs 4 (four) times daily as needed for wheezing. 06/08/16 06/08/17  Jeanmarie Plant, MD  benzonatate (TESSALON PERLES) 100 MG capsule Take 1 capsule (100 mg total) by mouth 2 (two) times daily as needed for cough. Patient not taking: Reported on 09/16/2017 02/18/17 02/18/18  Little, Traci M, PA-C  cephALEXin (KEFLEX) 500 MG capsule Take 1 capsule (500 mg total) by mouth 3 (three) times daily. Patient not taking: Reported on 09/16/2017 06/12/17   Loleta Rose, MD  fluticasone [facility]) 50 MCG/ACT nasal spray Place 2 sprays into both nostrils daily. Patient not taking: Reported on 09/16/2017 12/30/16   Menshew, Charlesetta Ivory, PA-C  ibuprofen (ADVIL,MOTRIN) 800 MG tablet Take 1 tablet (800 mg total) by mouth every 8 (eight) hours as needed. Patient not taking: Reported on 09/16/2017 10/13/15   Ignacia Bayley, PA-C  oxyCODONE-acetaminophen (ROXICET) 5-325 MG tablet Take 1 tablet by mouth every 6 (six) hours as needed. Patient not taking: Reported on 09/16/2017 10/13/15   Ignacia Bayley, PA-C  predniSONE (DELTASONE) 20 MG tablet Take 2 tablets (40 mg total) by mouth daily. Patient not taking: Reported on 09/16/2017 09/13/17   Sharman Cheek, MD  traMADol [facility])  50 MG tablet Take 1 tablet (50 mg total) by mouth every 6 (six) hours as needed for moderate pain. Patient not taking: Reported on 09/16/2017 01/02/15   Tommi RumpsSummers, Rhonda L, PA-C    Allergies Penicillins  No family history on file.  Social History Social History   Tobacco Use  . Smoking status: Current Every Day Smoker    Packs/day: 0.50    Types: Cigarettes  . Smokeless tobacco: Never Used  Substance Use Topics  . Alcohol use: Never    Frequency: Never    . Drug use: Yes    Comment: heroin.  last used 2 days ago.    Review of Systems  Constitutional: No fever/chills Eyes: No visual changes. ENT: No sore throat. Cardiovascular: Chest tightness resolved with DuoNeb Respiratory: shortness of breath. Gastrointestinal: No abdominal pain.  No nausea, no vomiting.  No diarrhea.  No constipation. Genitourinary: Negative for dysuria. Musculoskeletal: Negative for back pain. Skin: Negative for rash. Neurological: Negative for headaches, focal weakness  ____________________________________________   PHYSICAL EXAM:  VITAL SIGNS: ED Triage Vitals  Enc Vitals Group     BP 09/16/17 0951 (!) 164/90     Pulse Rate 09/16/17 0951 76     Resp 09/16/17 0951 (!) 22     Temp 09/16/17 0951 (!) 97.5 F (36.4 C)     Temp Source 09/16/17 0951 Oral     SpO2 09/16/17 0951 97 %     Weight 09/16/17 0950 196 lb (88.9 kg)     Height 09/16/17 0950 6' (1.829 m)     Head Circumference --      Peak Flow --      Pain Score 09/16/17 0950 5     Pain Loc --      Pain Edu? --      Excl. in GC? --     Constitutional: Alert and oriented. Well appearing and in no acute distress. Eyes: Conjunctivae are normal.  Head: Atraumatic. Nose: No congestion/rhinnorhea. Mouth/Throat: Mucous membranes are moist.  Oropharynx non-erythematous. Neck: No stridor Cardiovascular: Normal rate, regular rhythm. Grossly normal heart sounds.  Good peripheral circulation. Respiratory: Normal respiratory effort.  No retractions. Lungs diffuse wheezes clear with one neb Gastrointestinal: Soft and nontender. No distention. No abdominal bruits. No CVA tenderness. Musculoskeletal: No lower extremity tenderness nor edema.  No joint effusions. Neurologic:  Normal speech and language. No gross focal neurologic deficits are appreciated. No gait instability. Skin:  Skin is warm, dry and intact. No rash noted. Psychiatric: Mood and affect are normal. Speech and behavior are  normal.  ____________________________________________   LABS (all labs ordered are listed, but only abnormal results are displayed)  Labs Reviewed  COMPREHENSIVE METABOLIC PANEL - Abnormal; Notable for the following components:      Result Value   Sodium 131 (*)    Potassium 3.3 (*)    Chloride 98 (*)    Glucose, Bld 105 (*)    Calcium 8.4 (*)    Total Protein 6.0 (*)    All other components within normal limits  CBC WITH DIFFERENTIAL/PLATELET - Abnormal; Notable for the following components:   Platelets 125 (*)    Neutro Abs 7.2 (*)    Lymphs Abs 0.9 (*)    All other components within normal limits  TROPONIN I  FIBRIN DERIVATIVES D-DIMER ([facility] ONLY)  TROPONIN I   ____________________________________________  EKG EKG read and interpreted by me shows normal sinus rhythm rate of 77 rightward axis no acute ST-T changes no change from  previous EKG ________________________________________  RADIOLOGY  ED MD interpretation: Chest x-ray reviewed by me does show hyper inflation as radiology notes  Official radiology report(s): Dg Chest Portable 1 View  Result Date: 09/16/2017 CLINICAL DATA:  Weakness and chest pain. History of asthma, hepatitis C, substance abuse, current smoker. EXAM: PORTABLE CHEST 1 VIEW COMPARISON:  Chest x-ray of September 13, 2017 FINDINGS: The lungs are hyperinflated. There is no focal infiltrate. There is no pleural effusion. The heart and pulmonary vascularity are normal. The bony thorax exhibits no acute abnormality. IMPRESSION: Hyperinflation consistent with reactive airway disease or COPD. No pneumonia nor CHF. Electronically Signed   By: David  [place] M.D.   On: 09/16/2017 11:35    ____________________________________________   PROCEDURES  Procedure(s) performed:   Procedures  Critical Care performed:   ____________________________________________   INITIAL IMPRESSION / ASSESSMENT AND PLAN / ED COURSE  Patient's EKG shows no changes troponins  are negative problem resolved immediately with nebulizer this does look like COPD exacerbation I will discharge patient follow-up with his regular doctor or 1 of the clinics which I mention.        ____________________________________________   FINAL CLINICAL IMPRESSION(S) / ED DIAGNOSES  Final diagnoses:  Chronic obstructive pulmonary disease with acute exacerbation [facility])     ED Discharge Orders        Ordered    albuterol (ACCUNEB) 1.25 MG/3ML nebulizer solution  Every 6 hours PRN     09/16/17 1230       Note:  This document was prepared using Dragon voice recognition software and may include unintentional dictation errors.    Arnaldo Natal, MD 09/16/17 [phone]

## 2017-09-16 NOTE — Discharge Instructions (Signed)
Please follow-up with primary care.  [facility] can try the [facility] or the [facility] or the [facility] or [facility] or [facility].  Use your inhaler 2 puffs 4 times a day up to every 4 hours or your nebulizer at the same frequency.  Please return if you are worse at all .

## 2017-09-16 NOTE — ED Notes (Signed)
Patient states he is feeling much better since his breathing treatment.

## 2019-01-16 ENCOUNTER — Emergency Department
Admission: EM | Admit: 2019-01-16 | Discharge: 2019-01-16 | Disposition: A | Discharge status: Home / Self Care | Payer: Self-pay | Attending: Emergency Medicine | Admitting: Emergency Medicine

## 2019-01-16 ENCOUNTER — Other Ambulatory Visit: Payer: Self-pay

## 2019-01-16 ENCOUNTER — Encounter: Payer: Self-pay | Admitting: Emergency Medicine

## 2019-01-16 ENCOUNTER — Emergency Department: Payer: Self-pay

## 2019-01-16 DIAGNOSIS — J45909 Unspecified asthma, uncomplicated: Secondary | ICD-10-CM | POA: Insufficient documentation

## 2019-01-16 DIAGNOSIS — F17210 Nicotine dependence, cigarettes, uncomplicated: Secondary | ICD-10-CM | POA: Insufficient documentation

## 2019-01-16 DIAGNOSIS — R1084 Generalized abdominal pain: Secondary | ICD-10-CM | POA: Insufficient documentation

## 2019-01-16 LAB — URINALYSIS, COMPLETE (UACMP) WITH MICROSCOPIC
Bacteria, UA: NONE SEEN
Bilirubin Urine: NEGATIVE
Glucose, UA: NEGATIVE mg/dL
Hgb urine dipstick: NEGATIVE
Ketones, ur: NEGATIVE mg/dL
Leukocytes,Ua: NEGATIVE
Nitrite: NEGATIVE
Protein, ur: NEGATIVE mg/dL
Specific Gravity, Urine: 1.014 (ref 1.005–1.030)
pH: 7 (ref 5.0–8.0)

## 2019-01-16 LAB — CBC
HCT: 52.7 % — ABNORMAL HIGH (ref 39.0–52.0)
Hemoglobin: 17 g/dL (ref 13.0–17.0)
MCH: 29.9 pg (ref 26.0–34.0)
MCHC: 32.3 g/dL (ref 30.0–36.0)
MCV: 92.6 fL (ref 80.0–100.0)
Platelets: 179 10*3/uL (ref 150–400)
RBC: 5.69 MIL/uL (ref 4.22–5.81)
RDW: 12.6 % (ref 11.5–15.5)
WBC: 8.7 10*3/uL (ref 4.0–10.5)
nRBC: 0 % (ref 0.0–0.2)

## 2019-01-16 LAB — BASIC METABOLIC PANEL
Anion gap: 8 (ref 5–15)
BUN: 9 mg/dL (ref 6–20)
CO2: 30 mmol/L (ref 22–32)
Calcium: 9.2 mg/dL (ref 8.9–10.3)
Chloride: 101 mmol/L (ref 98–111)
Creatinine, Ser: 0.87 mg/dL (ref 0.61–1.24)
GFR calc Af Amer: >60 mL/min (ref >60)
GFR calc non Af Amer: >60 mL/min (ref >60)
Glucose, Bld: 76 mg/dL (ref 70–99)
Potassium: 4.4 mmol/L (ref 3.5–5.1)
Sodium: 139 mmol/L (ref 135–145)

## 2019-01-16 LAB — HEPATIC FUNCTION PANEL
ALT: 46 U/L — ABNORMAL HIGH (ref 0–44)
AST: 32 U/L (ref 15–41)
Albumin: 4.1 g/dL (ref 3.5–5.0)
Alkaline Phosphatase: 98 U/L (ref 38–126)
Bilirubin, Direct: <0.1 mg/dL (ref 0.0–0.2)
Total Bilirubin: 0.6 mg/dL (ref 0.3–1.2)
Total Protein: 7.4 g/dL (ref 6.5–8.1)

## 2019-01-16 LAB — LIPASE, BLOOD: Lipase: 19 U/L (ref 11–51)

## 2019-01-16 MED ORDER — KETOROLAC TROMETHAMINE 30 MG/ML IJ SOLN
15.0000 mg | INTRAMUSCULAR | Status: AC
  Administered 2019-01-16: 15 mg via INTRAVENOUS
  Filled 2019-01-16: qty 1

## 2019-01-16 MED ORDER — FAMOTIDINE 20 MG PO TABS: 20.0000 mg | ORAL_TABLET | Freq: Two times a day (BID) | ORAL | 0 refills | Status: DC

## 2019-01-16 MED ORDER — DICYCLOMINE HCL 10 MG PO CAPS
20.0000 mg | ORAL_CAPSULE | Freq: Once | ORAL | Status: AC
  Administered 2019-01-16: 20 mg via ORAL
  Filled 2019-01-16: qty 2

## 2019-01-16 MED ORDER — ONDANSETRON HCL 4 MG/2ML IJ SOLN
4.0000 mg | Freq: Once | INTRAMUSCULAR | Status: DC
  Filled 2019-01-16: qty 2

## 2019-01-16 MED ORDER — DICYCLOMINE HCL 20 MG PO TABS: 20.0000 mg | ORAL_TABLET | Freq: Three times a day (TID) | ORAL | 0 refills | Status: DC | PRN

## 2019-01-16 NOTE — ED Triage Notes (Addendum)
Pt c/o right flank pain for the past 2-3 days, pt states feels like previous kidney stones, but denies any Hematuria. Denies any dysuria.  Pt reports chills and fever.  Pt denies vomiting but c/o nausea.

## 2019-01-16 NOTE — ED Notes (Signed)
Pt to desk asking for water. Explained that pt will probably need to wait for CT results before MD will allow any PO intake. Pt then states, "Man I've been waiting. Just come and pull this shit out." (gesturing to IV) RN Rush Landmark verified pt intends to sign out AMA and pt states he does. IV removed per pt's demand by Rush Landmark, RN. Catheter tip intact. Pt would like to speak to MD. Will notify MD when he returns to desk.

## 2019-01-16 NOTE — ED Provider Notes (Signed)
[facility] Emergency Department Provider Note  ____________________________________________  Time seen: Approximately 2:51 PM  I have reviewed the triage vital signs and the nursing notes.   HISTORY  Chief Complaint Flank Pain    HPI Nathan Vasquez is a 38 y.o. male with a history of asthma hepatitis C opioid dependence who comes the ED complaining of generalized abdominal pain.   Feels like a kidney stone, worse on the right side but spreads all over the abdomen.  Waxing and waning no aggravating or alleviating factors.     Past Medical History:  Diagnosis Date  . Asthma   . Hep C w/o coma, chronic (HCC)   . Heroin abuse (HCC)   . History of balanitis      There are no active problems to display for this patient.    History reviewed. No pertinent surgical history.   Prior to Admission medications   Medication Sig Start Date End Date Taking? Authorizing Provider  albuterol (ACCUNEB) 1.25 MG/3ML nebulizer solution Take 3 mLs (1.25 mg total) by nebulization every 6 (six) hours as needed for wheezing. 09/16/17   Arnaldo NatalMalinda, Paul F, MD  albuterol (PROVENTIL HFA;VENTOLIN HFA) 108 (90 Base) MCG/ACT inhaler Inhale 2 puffs into the lungs every 6 (six) hours as needed for wheezing or shortness of breath. 09/13/17   Sharman CheekStafford, Cedarius Kersh, MD  albuterol (PROVENTIL) (2.5 MG/3ML) 0.083% nebulizer solution Take 3 mLs (2.5 mg total) by nebulization every 6 (six) hours as needed for wheezing or shortness of breath. Patient not taking: Reported on 09/16/2017 07/18/17   Emily FilbertWilliams, Jonathan E, MD  albuterol-ipratropium [facility](COMBIVENT) [phone] MCG/ACT inhaler Inhale 2 puffs into the lungs 4 (four) times daily as needed for wheezing. 06/08/16 06/08/17  Jeanmarie PlantMcShane, James A, MD  azithromycin (ZITHROMAX Z-PAK) 250 MG tablet Take 2 tablets (500 mg) on  Day 1,  followed by 1 tablet (250 mg) once daily on Days 2 through 5. 09/13/17   Sharman CheekStafford, Nasirah Sachs, MD  buprenorphine-naloxone (SUBOXONE)  8-2 mg SUBL SL tablet Place 1 tablet under the tongue 2 (two) times daily.    [provider]  cephALEXin (KEFLEX) 500 MG capsule Take 1 capsule (500 mg total) by mouth 3 (three) times daily. Patient not taking: Reported on 09/16/2017 06/12/17   Loleta RoseForbach, Cory, MD  dicyclomine (BENTYL) 20 MG tablet Take 1 tablet (20 mg total) by mouth 3 (three) times daily as needed for spasms. 01/16/19   Sharman CheekStafford, Anysia Choi, MD  DULoxetine (CYMBALTA) 60 MG capsule Take 60 mg by mouth daily.    [provider]  famotidine (PEPCID) 20 MG tablet Take 1 tablet (20 mg total) by mouth 2 (two) times daily. 01/16/19   Sharman CheekStafford, Raymonda Pell, MD  fluticasone [facility](FLONASE) 50 MCG/ACT nasal spray Place 2 sprays into both nostrils daily. Patient not taking: Reported on 09/16/2017 12/30/16   Menshew, Charlesetta IvoryJenise V Bacon, PA-C  ibuprofen (ADVIL,MOTRIN) 800 MG tablet Take 1 tablet (800 mg total) by mouth every 8 (eight) hours as needed. Patient not taking: Reported on 09/16/2017 10/13/15   Ignacia Bayleyumey, Robert, PA-C  oxyCODONE-acetaminophen (ROXICET) 5-325 MG tablet Take 1 tablet by mouth every 6 (six) hours as needed. Patient not taking: Reported on 09/16/2017 10/13/15   Ignacia Bayleyumey, Robert, PA-C  predniSONE (DELTASONE) 20 MG tablet Take 2 tablets (40 mg total) by mouth daily. Patient not taking: Reported on 09/16/2017 09/13/17   Sharman CheekStafford, Jezabel Lecker, MD  traMADol (ULTRAM) 50 MG tablet Take 1 tablet (50 mg total) by mouth every 6 (six) hours as needed for moderate  pain. Patient not taking: Reported on 09/16/2017 01/02/15   Tommi RumpsSummers, Rhonda L, PA-C     Allergies Penicillins   History reviewed. No pertinent family history.  Social History Social History   Tobacco Use  . Smoking status: Current Every Day Smoker    Packs/day: 0.50    Types: Cigarettes  . Smokeless tobacco: Never Used  Substance Use Topics  . Alcohol use: Never    Frequency: Never  . Drug use: Yes    Comment: heroin.  last used 2 days ago.    Review of  Systems  Constitutional:   No fever or chills.  ENT:   No sore throat. No rhinorrhea. Cardiovascular:   No chest pain or syncope. Respiratory:   No dyspnea or cough. Gastrointestinal:   Positive as above for abdominal pain without vomiting and diarrhea.  Musculoskeletal:   Negative for focal pain or swelling All other systems reviewed and are negative except as documented above in ROS and HPI.  ____________________________________________   PHYSICAL EXAM:  VITAL SIGNS: ED Triage Vitals [01/16/19 1100]  Enc Vitals Group     BP (!) 167/85     Pulse Rate 88     Resp 18     Temp 98.7 F (37.1 C)     Temp Source Oral     SpO2 94 %     Weight 230 lb (104.3 kg)     Height 6' (1.829 m)     Head Circumference      Peak Flow      Pain Score 8     Pain Loc      Pain Edu?      Excl. in GC?     Vital signs reviewed, nursing assessments reviewed.   Constitutional:   Alert and oriented. Non-toxic appearance. Eyes:   Conjunctivae are normal. EOMI. PERRL. ENT      Head:   Normocephalic and atraumatic.      Nose:   No congestion/rhinnorhea.       Mouth/Throat:   MMM, no pharyngeal erythema. No peritonsillar mass.       Neck:   No meningismus. Full ROM. Hematological/Lymphatic/Immunilogical:   No cervical lymphadenopathy. Cardiovascular:   RRR. Symmetric bilateral radial and DP pulses.  No murmurs. Cap refill less than 2 seconds. Respiratory:   Normal respiratory effort without tachypnea/retractions. Breath sounds are clear and equal bilaterally. No wheezes/rales/rhonchi. Gastrointestinal:   Soft without focal tenderness. Non distended. There is no CVA tenderness.  No rebound, rigidity, or guarding.  Musculoskeletal:   Normal range of motion in all extremities. No joint effusions.  No lower extremity tenderness.  No edema. Neurologic:   Normal speech and language.  Motor grossly intact. No acute focal neurologic deficits are appreciated.  Skin:    Skin is warm, dry and intact. No  rash noted.  No petechiae, purpura, or bullae.  ____________________________________________    LABS (pertinent positives/negatives) (all labs ordered are listed, but only abnormal results are displayed) Labs Reviewed  URINALYSIS, COMPLETE (UACMP) WITH MICROSCOPIC - Abnormal; Notable for the following components:      Result Value   Color, Urine YELLOW (*)    APPearance CLEAR (*)    All other components within normal limits  CBC - Abnormal; Notable for the following components:   HCT 52.7 (*)    All other components within normal limits  HEPATIC FUNCTION PANEL - Abnormal; Notable for the following components:   ALT 46 (*)    All other components within normal limits  BASIC METABOLIC PANEL  LIPASE, BLOOD   ____________________________________________   EKG    ____________________________________________    RADIOLOGY  Ct Renal Stone Study  Result Date: 01/16/2019 CLINICAL DATA:  Right flank pain, chills and fever EXAM: CT ABDOMEN AND PELVIS WITHOUT CONTRAST TECHNIQUE: Multidetector CT imaging of the abdomen and pelvis was performed following the standard protocol without IV contrast. COMPARISON:  09/25/2013 FINDINGS: Lower chest: Minimal basilar atelectasis. Left lower lobe 5 mm pulmonary nodule noted, image 16 series 4. No acute lower chest finding. No pericardial or pleural effusion. Normal heart size. Hepatobiliary: Limited without IV contrast. No large focal hepatic abnormality or intrahepatic biliary dilatation. Gallbladder biliary system unremarkable. Common bile duct nondilated. Pancreas: Unremarkable. No pancreatic ductal dilatation or surrounding inflammatory changes. Spleen: Normal in size without focal abnormality. Adrenals/Urinary Tract: Adrenal glands are unremarkable. Kidneys are normal, without renal calculi, focal lesion, or hydronephrosis. Bladder is unremarkable. Stomach/Bowel: Stomach is within normal limits. Appendix appears normal. No evidence of bowel wall  thickening, distention, or inflammatory changes. Vascular/Lymphatic: Limited without IV contrast. No large aneurysm or retroperitoneal hemorrhage. No bulky adenopathy. Reproductive: Unremarkable by CT. Other: No abdominal wall hernia or abnormality. No abdominopelvic ascites. Musculoskeletal: No acute or significant osseous findings. Minor degenerative changes of the lower thoracic and lumbar spine. IMPRESSION: No acute obstructing urinary tract calculus, hydronephrosis, or acute obstructive uropathy. No other acute intra-abdominopelvic finding by CT. 5 mm left lower lobe pulmonary nodule. No follow-up needed if patient is low-risk. Non-contrast chest CT can be considered in 12 months if patient is high-risk. This recommendation follows the consensus statement: Guidelines for Management of Incidental Pulmonary Nodules Detected on CT Images: From the Fleischner Society 2017; Radiology 2017; 284:228-243. Interval progression of the mild thoracolumbar degenerative change. Electronically Signed   By: Judie PetitM.  Shick M.D.   On: 01/16/2019 13:27    ____________________________________________   PROCEDURES Procedures  ____________________________________________  DIFFERENTIAL DIAGNOSIS   Renal colic, opioid withdrawal, appendicitis, bowel obstruction, diverticulitis  CLINICAL IMPRESSION / ASSESSMENT AND PLAN / ED COURSE  Medications ordered in the ED: Medications  ondansetron (ZOFRAN) injection 4 mg (4 mg Intravenous Refused 01/16/19 1220)  ketorolac (TORADOL) 30 MG/ML injection 15 mg (15 mg Intravenous Given 01/16/19 1216)  dicyclomine (BENTYL) capsule 20 mg (20 mg Oral Given 01/16/19 1408)    Pertinent labs & imaging results that were available during my care of the patient were reviewed by me and considered in my medical decision making (see chart for details).  Nathan Vasquez was evaluated in Emergency Department on 01/16/2019 for the symptoms described in the history of present illness. He was  evaluated in the context of the global COVID-19 pandemic, which necessitated consideration that the patient might be at risk for infection with the SARS-CoV-2 virus that causes COVID-19. Institutional protocols and algorithms that pertain to the evaluation of patients at risk for COVID-19 are in a state of rapid change based on information released by regulatory bodies including the CDC and federal and state organizations. These policies and algorithms were followed during the patient's care in the ED.   Patient presents with generalized abdominal pain and mild tenderness.  Nonfocal, vital signs unremarkable.  Nontoxic.  Complicated by chronic opioid dependence on high-dose methadone maintenance program.  Labs reassuring.  CT abdomen pelvis unremarkable.  Patient feeling better after Bentyl and Toradol, stable for discharge home.Considering the patient's symptoms, medical history, and physical examination today, I have low suspicion for cholecystitis or biliary pathology, pancreatitis, perforation or bowel obstruction, hernia, intra-abdominal  abscess, AAA or dissection, volvulus or intussusception, mesenteric ischemia, or appendicitis.        ____________________________________________   FINAL CLINICAL IMPRESSION(S) / ED DIAGNOSES    Final diagnoses:  Generalized abdominal pain     ED Discharge Orders         Ordered    dicyclomine (BENTYL) 20 MG tablet  3 times daily PRN     01/16/19 1451    famotidine (PEPCID) 20 MG tablet  2 times daily     01/16/19 1451          Portions of this note were generated with dragon dictation software. Dictation errors may occur despite best attempts at proofreading.   Carrie Mew, MD 01/16/19 [phone]

## 2019-01-16 NOTE — ED Notes (Signed)
Pt returned from CT °

## 2019-01-16 NOTE — ED Notes (Signed)
MD notified of pt's desire to leave. MD states he will speak to pt.

## 2019-01-16 NOTE — ED Notes (Signed)
Patient transported to CT 

## 2019-09-09 ENCOUNTER — Ambulatory Visit: Payer: Self-pay | Admitting: Urology

## 2019-09-09 ENCOUNTER — Other Ambulatory Visit: Payer: Self-pay

## 2019-09-09 VITALS — BP 131/89 | HR 99 | Ht 72.0 in | Wt 264.0 lb

## 2019-09-09 DIAGNOSIS — K219 Gastro-esophageal reflux disease without esophagitis: Secondary | ICD-10-CM

## 2019-09-09 DIAGNOSIS — K739 Chronic hepatitis, unspecified: Secondary | ICD-10-CM

## 2019-09-09 DIAGNOSIS — J449 Chronic obstructive pulmonary disease, unspecified: Secondary | ICD-10-CM

## 2019-09-09 MED ORDER — PREDNISONE 5 MG PO TABS: 5.0000 mg | ORAL_TABLET | Freq: Every day | ORAL | 0 refills | Status: DC

## 2019-09-09 MED ORDER — FLUTICASONE-SALMETEROL 100-50 MCG/DOSE IN AEPB: 1.0000 | INHALATION_SPRAY | Freq: Two times a day (BID) | RESPIRATORY_TRACT | 3 refills | Status: DC

## 2019-09-09 MED ORDER — OMEPRAZOLE 20 MG PO CPDR: 20.0000 mg | DELAYED_RELEASE_CAPSULE | Freq: Every day | ORAL | 0 refills | Status: DC

## 2019-09-09 MED ORDER — ALBUTEROL SULFATE HFA 108 (90 BASE) MCG/ACT IN AERS: 2.0000 | INHALATION_SPRAY | Freq: Four times a day (QID) | RESPIRATORY_TRACT | 3 refills | Status: DC | PRN

## 2019-09-09 MED ORDER — ALBUTEROL SULFATE 1.25 MG/3ML IN NEBU: 1.0000 | INHALATION_SOLUTION | Freq: Four times a day (QID) | RESPIRATORY_TRACT | 12 refills | Status: DC | PRN

## 2019-09-09 NOTE — Progress Notes (Signed)
  Patient: Nathan Vasquez Male    DOB: Apr 22, 1981   39 y.o.   MRN: [id number] Visit Date: 09/09/2019  Today's Provider: Michiel Cowboy, PA-C   Chief Complaint  Patient presents with  . Asthma    trouble breathing  . COPD   Subjective:    HPI  Had has issues with breathing over the past couple months  Has history of Hep C  Has history of GERD    Allergies  Allergen Reactions  . Penicillins Rash   Previous Medications   ACETAMINOPHEN (TYLENOL) 325 MG TABLET    Take 325 mg by mouth every 6 (six) hours as needed.   ALBUTEROL (PROVENTIL) (2.5 MG/3ML) 0.083% NEBULIZER SOLUTION    Take 3 mLs (2.5 mg total) by nebulization every 6 (six) hours as needed for wheezing or shortness of breath.   AZITHROMYCIN (ZITHROMAX Z-PAK) 250 MG TABLET    Take 2 tablets (500 mg) on  Day 1,  followed by 1 tablet (250 mg) once daily on Days 2 through 5.   BUPRENORPHINE-NALOXONE (SUBOXONE) 8-2 MG SUBL SL TABLET    Place 1 tablet under the tongue 2 (two) times daily.   CEPHALEXIN (KEFLEX) 500 MG CAPSULE    Take 1 capsule (500 mg total) by mouth 3 (three) times daily.   DICYCLOMINE (BENTYL) 20 MG TABLET    Take 1 tablet (20 mg total) by mouth 3 (three) times daily as needed for spasms.   DULOXETINE (CYMBALTA) 60 MG CAPSULE    Take 60 mg by mouth daily.   FAMOTIDINE (PEPCID) 20 MG TABLET    Take 1 tablet (20 mg total) by mouth 2 (two) times daily.   FLUTICASONE (FLONASE) 50 MCG/ACT NASAL SPRAY    Place 2 sprays into both nostrils daily.   IBUPROFEN (ADVIL,MOTRIN) 800 MG TABLET    Take 1 tablet (800 mg total) by mouth every 8 (eight) hours as needed.   OXYCODONE-ACETAMINOPHEN (ROXICET) 5-325 MG TABLET    Take 1 tablet by mouth every 6 (six) hours as needed.   TRAMADOL (ULTRAM) 50 MG TABLET    Take 1 tablet (50 mg total) by mouth every 6 (six) hours as needed for moderate pain.    Review of Systems  Social History   Tobacco Use  . Smoking status: Current Every Day Smoker    Packs/day: 0.50   Types: Cigarettes  . Smokeless tobacco: Never Used  Substance Use Topics  . Alcohol use: Yes    Alcohol/week: 1.0 standard drinks    Types: 1 Cans of beer per week    Comment: occasionally   Objective:   BP 131/89   Pulse 99   Ht 6' (1.829 m)   Wt 264 lb (119.7 kg)   SpO2 (!) 89%   BMI 35.80 kg/m   Physical Exam      Assessment & Plan:   1. COPD mixed type (HCC) - prescribed albuterol rescue inhaler, Advair and prednisone - still smoking - trying to quit - needs follow up in one month  2. GERD - prescribed omeprazole  3. Hep C - labs    Michiel Cowboy, PA-C   [facility]

## 2019-09-17 ENCOUNTER — Other Ambulatory Visit: Payer: Self-pay

## 2019-09-23 ENCOUNTER — Other Ambulatory Visit: Payer: Self-pay

## 2019-09-23 ENCOUNTER — Ambulatory Visit: Payer: Self-pay | Admitting: Licensed Clinical Social Worker

## 2019-09-23 DIAGNOSIS — F411 Generalized anxiety disorder: Secondary | ICD-10-CM

## 2019-09-23 DIAGNOSIS — F1120 Opioid dependence, uncomplicated: Secondary | ICD-10-CM

## 2019-09-23 DIAGNOSIS — F332 Major depressive disorder, recurrent severe without psychotic features: Secondary | ICD-10-CM
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
## 2019-09-25 ENCOUNTER — Ambulatory Visit: Payer: Self-pay | Attending: Oncology

## 2019-09-25 DIAGNOSIS — Z23 Encounter for immunization: Secondary | ICD-10-CM

## 2019-09-25 NOTE — Progress Notes (Signed)
   Covid-19 Vaccination Clinic  Name:  THERON CUMBIE    MRN: [id number] DOB: March 18, 1981  09/25/2019  Mr. Bridgewater was observed post Covid-19 immunization for 15 minutes without incident. He was provided with Vaccine Information Sheet and instruction to access the V-Safe system.   Mr. Froelich was instructed to call 911 with any severe reactions post vaccine: [name] Difficulty breathing  . Swelling of face and throat  . A fast heartbeat  . A bad rash all over body  . Dizziness and weakness   Immunizations Administered    Name Date Dose VIS Date Route   Pfizer COVID-19 Vaccine 09/25/2019  8:53 AM 0.3 mL 07/21/2018 Intramuscular   Manufacturer: [employer], [employer]   Lot: [id number]   NDC: [address]-7

## 2019-09-28 ENCOUNTER — Institutional Professional Consult (permissible substitution): Payer: Self-pay | Admitting: Licensed Clinical Social Worker

## 2019-09-28 ENCOUNTER — Encounter: Payer: Self-pay | Admitting: Licensed Clinical Social Worker

## 2019-09-28 DIAGNOSIS — F411 Generalized anxiety disorder: Secondary | ICD-10-CM | POA: Insufficient documentation

## 2019-09-30 ENCOUNTER — Other Ambulatory Visit: Payer: Self-pay

## 2019-09-30 ENCOUNTER — Ambulatory Visit: Payer: Self-pay

## 2019-09-30 DIAGNOSIS — J449 Chronic obstructive pulmonary disease, unspecified: Secondary | ICD-10-CM

## 2019-10-02 LAB — CBC WITH DIFFERENTIAL/PLATELET
Basophils Absolute: 0 10*3/uL (ref 0.0–0.2)
Basos: 1 %
EOS (ABSOLUTE): 0.1 10*3/uL (ref 0.0–0.4)
Eos: 2 %
Hematocrit: 47 % (ref 37.5–51.0)
Hemoglobin: 16 g/dL (ref 13.0–17.7)
Immature Grans (Abs): 0 10*3/uL (ref 0.0–0.1)
Immature Granulocytes: 0 %
Lymphocytes Absolute: 1.9 10*3/uL (ref 0.7–3.1)
Lymphs: 29 %
MCH: 30.5 pg (ref 26.6–33.0)
MCHC: 34 g/dL (ref 31.5–35.7)
MCV: 90 fL (ref 79–97)
Monocytes Absolute: 0.6 10*3/uL (ref 0.1–0.9)
Monocytes: 8 %
Neutrophils Absolute: 4 10*3/uL (ref 1.4–7.0)
Neutrophils: 60 %
Platelets: 154 10*3/uL (ref 150–450)
RBC: 5.24 x10E6/uL (ref 4.14–5.80)
RDW: 11.9 % (ref 11.6–15.4)
WBC: 6.7 10*3/uL (ref 3.4–10.8)

## 2019-10-02 LAB — COMPREHENSIVE METABOLIC PANEL
ALT: 35 IU/L (ref 0–44)
AST: 18 IU/L (ref 0–40)
Albumin/Globulin Ratio: 1.8 (ref 1.2–2.2)
Albumin: 4.1 g/dL (ref 4.0–5.0)
Alkaline Phosphatase: 118 IU/L — ABNORMAL HIGH (ref 39–117)
BUN/Creatinine Ratio: 8 — ABNORMAL LOW (ref 9–20)
BUN: 7 mg/dL (ref 6–20)
Bilirubin Total: 0.2 mg/dL (ref 0.0–1.2)
CO2: 26 mmol/L (ref 20–29)
Calcium: 9.2 mg/dL (ref 8.7–10.2)
Chloride: 105 mmol/L (ref 96–106)
Creatinine, Ser: 0.87 mg/dL (ref 0.76–1.27)
GFR calc Af Amer: 127 mL/min/{1.73_m2} (ref >59)
GFR calc non Af Amer: 109 mL/min/{1.73_m2} (ref >59)
Globulin, Total: 2.3 g/dL (ref 1.5–4.5)
Glucose: 70 mg/dL (ref 65–99)
Potassium: 4.4 mmol/L (ref 3.5–5.2)
Sodium: 144 mmol/L (ref 134–144)
Total Protein: 6.4 g/dL (ref 6.0–8.5)

## 2019-10-02 LAB — HCV RNA QUANT
HCV log10: 5.574 log10 IU/mL
Hepatitis C Quantitation: 375000 IU/mL

## 2019-10-02 LAB — LIPID PANEL
Chol/HDL Ratio: 2.9 ratio (ref 0.0–5.0)
Cholesterol, Total: 158 mg/dL (ref 100–199)
HDL: 55 mg/dL (ref >39)
LDL Chol Calc (NIH): 75 mg/dL (ref 0–99)
Triglycerides: 167 mg/dL — ABNORMAL HIGH (ref 0–149)
VLDL Cholesterol Cal: 28 mg/dL (ref 5–40)

## 2019-10-02 LAB — HEMOGLOBIN A1C
Est. average glucose Bld gHb Est-mCnc: 100 mg/dL
Hgb A1c MFr Bld: 5.1 % (ref 4.8–5.6)

## 2019-10-02 LAB — TSH: TSH: 1.12 u[IU]/mL (ref 0.450–4.500)

## 2019-10-04 ENCOUNTER — Encounter: Payer: Self-pay | Admitting: Emergency Medicine

## 2019-10-04 ENCOUNTER — Emergency Department
Admission: EM | Admit: 2019-10-04 | Discharge: 2019-10-04 | Disposition: A | Discharge status: Home / Self Care | Payer: Self-pay | Attending: Emergency Medicine | Admitting: Emergency Medicine

## 2019-10-04 ENCOUNTER — Other Ambulatory Visit: Payer: Self-pay

## 2019-10-04 DIAGNOSIS — F17210 Nicotine dependence, cigarettes, uncomplicated: Secondary | ICD-10-CM | POA: Insufficient documentation

## 2019-10-04 DIAGNOSIS — Z79899 Other long term (current) drug therapy: Secondary | ICD-10-CM | POA: Insufficient documentation

## 2019-10-04 DIAGNOSIS — K0889 Other specified disorders of teeth and supporting structures: Secondary | ICD-10-CM | POA: Insufficient documentation

## 2019-10-04 DIAGNOSIS — F1210 Cannabis abuse, uncomplicated: Secondary | ICD-10-CM | POA: Insufficient documentation

## 2019-10-04 DIAGNOSIS — J449 Chronic obstructive pulmonary disease, unspecified: Secondary | ICD-10-CM | POA: Insufficient documentation

## 2019-10-04 HISTORY — DX: Chronic obstructive pulmonary disease, unspecified: J44.9

## 2019-10-04 MED ORDER — MAGIC MOUTHWASH W/LIDOCAINE
5.0000 mL | Freq: Four times a day (QID) | ORAL | 0 refills | Status: DC
Start: 2019-10-04 — End: 2019-10-28

## 2019-10-04 MED ORDER — OXYCODONE-ACETAMINOPHEN 5-325 MG PO TABS
1.0000 | ORAL_TABLET | Freq: Once | ORAL | Status: AC
  Administered 2019-10-04: 1 via ORAL
  Filled 2019-10-04: qty 1

## 2019-10-04 MED ORDER — DOXYCYCLINE HYCLATE 100 MG PO TABS
100.0000 mg | ORAL_TABLET | Freq: Two times a day (BID) | ORAL | 0 refills | Status: DC
Start: 2019-10-04 — End: 2019-10-28

## 2019-10-04 MED ORDER — HYDROCODONE-ACETAMINOPHEN 5-325 MG PO TABS: 1.0000 | ORAL_TABLET | ORAL | 0 refills | Status: DC | PRN

## 2019-10-04 NOTE — ED Provider Notes (Signed)
[facility] Emergency Department Provider Note  ____________________________________________  Time seen: Approximately 7:48 PM  I have reviewed the triage vital signs and the nursing notes.   HISTORY  Chief Complaint Dental Pain    HPI Nathan Vasquez is a 39 y.o. male who presents the emergency department complaining of right lower dental pain.  Patient had dental work with 2 teeth pulled, 1 to the right upper and 1 to the right lower dentition.  Patient states that the right upper dentition is healed well but he is still having significant pain to the right lower dentition.  Patient is presenting requesting something to help with pain and is also concerned that there may be an infection.  No fevers or chills.  No URI symptoms.  No difficulty breathing or swallowing.  Patient is taken over-the-counter medications, is using Orajel and ice without relief.         Past Medical History:  Diagnosis Date  . Asthma   . COPD (chronic obstructive pulmonary disease) (HCC)   . Hep C w/o coma, chronic (HCC)   . Heroin abuse (HCC)   . History of balanitis     Patient Active Problem List   Diagnosis Date Noted  . Generalized anxiety disorder 09/28/2019    Past Surgical History:  Procedure Laterality Date  . TOOTH EXTRACTION      Prior to Admission medications   Medication Sig Start Date End Date Taking? Authorizing Provider  acetaminophen (TYLENOL) 325 MG tablet Take 325 mg by mouth every 6 (six) hours as needed.    [provider]  albuterol (ACCUNEB) 1.25 MG/3ML nebulizer solution Take 3 mLs (1.25 mg total) by nebulization every 6 (six) hours as needed for wheezing. 09/09/19   Michiel Cowboy A, PA-C  albuterol (PROVENTIL) (2.5 MG/3ML) 0.083% nebulizer solution Take 3 mLs (2.5 mg total) by nebulization every 6 (six) hours as needed for wheezing or shortness of breath. Patient not taking: Reported on 09/16/2017 07/18/17   Emily Filbert, MD   albuterol (VENTOLIN HFA) 108 (90 Base) MCG/ACT inhaler Inhale 2 puffs into the lungs every 6 (six) hours as needed for wheezing or shortness of breath. 09/09/19   McGowan, Carollee Herter A, PA-C  azithromycin (ZITHROMAX Z-PAK) 250 MG tablet Take 2 tablets (500 mg) on  Day 1,  followed by 1 tablet (250 mg) once daily on Days 2 through 5. Patient not taking: Reported on 09/09/2019 09/13/17   Sharman Cheek, MD  buprenorphine-naloxone (SUBOXONE) 8-2 mg SUBL SL tablet Place 1 tablet under the tongue 2 (two) times daily.    [provider]  cephALEXin (KEFLEX) 500 MG capsule Take 1 capsule (500 mg total) by mouth 3 (three) times daily. Patient not taking: Reported on 09/16/2017 06/12/17   Loleta Rose, MD  dicyclomine (BENTYL) 20 MG tablet Take 1 tablet (20 mg total) by mouth 3 (three) times daily as needed for spasms. Patient not taking: Reported on 09/09/2019 01/16/19   Sharman Cheek, MD  doxycycline (VIBRA-TABS) 100 MG tablet Take 1 tablet (100 mg total) by mouth 2 (two) times daily. 10/04/19   Amayiah Gosnell, Delorise Royals, PA-C  DULoxetine (CYMBALTA) 60 MG capsule Take 60 mg by mouth daily.    [provider]  famotidine (PEPCID) 20 MG tablet Take 1 tablet (20 mg total) by mouth 2 (two) times daily. Patient not taking: Reported on 09/09/2019 01/16/19   Sharman Cheek, MD  fluticasone [facility]) 50 MCG/ACT nasal spray Place 2 sprays into both nostrils daily. Patient not  taking: Reported on 09/16/2017 12/30/16   Menshew, Charlesetta Ivory, PA-C  Fluticasone-Salmeterol (ADVAIR DISKUS) 100-50 MCG/DOSE AEPB Inhale 1 puff into the lungs 2 (two) times daily. 09/09/19   McGowan, Wellington Hampshire, PA-C  HYDROcodone-acetaminophen (NORCO/VICODIN) 5-325 MG tablet Take 1 tablet by mouth every 4 (four) hours as needed for moderate pain. 10/04/19   Tasmine Hipwell, Delorise Royals, PA-C  ibuprofen (ADVIL,MOTRIN) 800 MG tablet Take 1 tablet (800 mg total) by mouth every 8 (eight) hours as needed. Patient not taking: Reported on  09/16/2017 10/13/15   Ignacia Bayley, PA-C  magic mouthwash w/lidocaine SOLN Take 5 mLs by mouth 4 (four) times daily. 10/04/19   Lindsey Demonte, Delorise Royals, PA-C  omeprazole (PRILOSEC) 20 MG capsule Take 1 capsule (20 mg total) by mouth daily. 09/09/19   McGowan, Wellington Hampshire, PA-C  oxyCODONE-acetaminophen (ROXICET) 5-325 MG tablet Take 1 tablet by mouth every 6 (six) hours as needed. Patient not taking: Reported on 09/16/2017 10/13/15   Ignacia Bayley, PA-C  predniSONE (DELTASONE) 5 MG tablet Take 1 tablet (5 mg total) by mouth daily with breakfast. 09/09/19   McGowan, Carollee Herter A, PA-C  traMADol (ULTRAM) 50 MG tablet Take 1 tablet (50 mg total) by mouth every 6 (six) hours as needed for moderate pain. Patient not taking: Reported on 09/16/2017 01/02/15   Tommi Rumps, PA-C  albuterol-ipratropium (COMBIVENT) 18-103 MCG/ACT inhaler Inhale 2 puffs into the lungs 4 (four) times daily as needed for wheezing. 06/08/16 09/09/19  Jeanmarie Plant, MD    Allergies Penicillins  Family History  Problem Relation Age of Onset  . COPD Mother   . Diabetes Mother   . Heart disease Mother   . Anxiety disorder Mother   . Stroke Father   . Schizophrenia Father   . Diabetes Maternal Grandmother   . Diabetes Maternal Grandfather   . Cancer Maternal Grandfather   . Diabetes Paternal Grandmother   . Diabetes Paternal Grandfather   . Heart attack Paternal Grandfather     Social History Social History   Tobacco Use  . Smoking status: Current Every Day Smoker    Packs/day: 0.50    Types: Cigarettes  . Smokeless tobacco: Never Used  Substance Use Topics  . Alcohol use: Yes    Alcohol/week: 1.0 standard drinks    Types: 1 Cans of beer per week    Comment: occasionally  . Drug use: Yes    Frequency: 7.0 times per week    Types: Heroin, Marijuana    Comment: heroin.  this morning, trying to quit but finds it difficult with pain/sickness     Review of Systems  Constitutional: No fever/chills Eyes: No visual  changes. No discharge ENT: Right upper and lower dental surgery, right lower dentition pain. Cardiovascular: no chest pain. Respiratory: no cough. No SOB. Gastrointestinal: No abdominal pain.  No nausea, no vomiting.  No diarrhea.  No constipation. Musculoskeletal: Negative for musculoskeletal pain. Skin: Negative for rash, abrasions, lacerations, ecchymosis. Neurological: Negative for headaches, focal weakness or numbness. 10-point ROS otherwise negative.  ____________________________________________   PHYSICAL EXAM:  VITAL SIGNS: ED Triage Vitals  Enc Vitals Group     BP 10/04/19 1900 (!) 151/97     Pulse Rate 10/04/19 1900 98     Resp 10/04/19 1900 16     Temp 10/04/19 1900 98.8 F (37.1 C)     Temp Source 10/04/19 1900 Oral     SpO2 10/04/19 1900 98 %     Weight 10/04/19 1901 266 lb (120.7 kg)  Height 10/04/19 1901 6' (1.829 m)     Head Circumference --      Peak Flow --      Pain Score 10/04/19 1901 10     Pain Loc --      Pain Edu? --      Excl. in GC? --      Constitutional: Alert and oriented. Well appearing and in no acute distress. Eyes: Conjunctivae are normal. PERRL. EOMI. Head: Atraumatic. ENT:      Ears:       Nose: No congestion/rhinnorhea.      Mouth/Throat: Mucous membranes are moist.  Visualization of the right lower dentition reveals surgical site for dental removal of the third molar right lower dentition.  No evidence of infection with erythema, edema or drainage.  No evidence of dry socket.  Dental area has not fully healed over.  Evaluation of the right upper dentition with dental removal reveals area is close to being fully healed with no opening, no erythema or edema. Neck: No stridor.  Neck is supple full range of motion Hematological/Lymphatic/Immunilogical: No cervical lymphadenopathy. Cardiovascular: Normal rate, regular rhythm. Normal S1 and S2.  Good peripheral circulation. Respiratory: Normal respiratory effort without tachypnea or  retractions. Lungs CTAB. Good air entry to the bases with no decreased or absent breath sounds. Musculoskeletal: Full range of motion to all extremities. No gross deformities appreciated. Neurologic:  Normal speech and language. No gross focal neurologic deficits are appreciated.  Skin:  Skin is warm, dry and intact. No rash noted. Psychiatric: Mood and affect are normal. Speech and behavior are normal. Patient exhibits appropriate insight and judgement.   ____________________________________________   LABS (all labs ordered are listed, but only abnormal results are displayed)  Labs Reviewed - No data to display ____________________________________________  EKG   ____________________________________________  RADIOLOGY   No results found.  ____________________________________________    PROCEDURES  Procedure(s) performed:    Procedures    Medications  oxyCODONE-acetaminophen (PERCOCET/ROXICET) 5-325 MG per tablet 1 tablet (has no administration in time range)     ____________________________________________   INITIAL IMPRESSION / ASSESSMENT AND PLAN / ED COURSE  Pertinent labs & imaging results that were available during my care of the patient were reviewed by me and considered in my medical decision making (see chart for details).  Review of the [place] CSRS was performed in accordance of the [facility] prior to dispensing any controlled drugs.           Patient's diagnosis is consistent with dental pain following dental extraction.  Patient presented to the emergency department with ongoing pain to the right lower dentition.  Patient has been flushing the area to prevent dry socket.  There is no evidence of dry socket on exam.  Visualization also does not have any evidence of infection.  Wound is still open and I feel that this is likely just pain secondary to the extraction.  I will place the patient on Magic mouthwash, pain medication.  I will fill a prescription for  antibiotics and have instructed the patient if the area becomes increasingly painful, erythematous or edematous that he at that point should start antibiotics.  Do not start antibiotics at this time.  Follow-up with dentist..  Patient is given ED precautions to return to the ED for any worsening or new symptoms.     ____________________________________________  FINAL CLINICAL IMPRESSION(S) / ED DIAGNOSES  Final diagnoses:  Pain, dental      NEW MEDICATIONS STARTED DURING THIS VISIT:  ED Discharge Orders         Ordered    HYDROcodone-acetaminophen (NORCO/VICODIN) 5-325 MG tablet  Every 4 hours PRN     10/04/19 2007    magic mouthwash w/lidocaine SOLN  4 times daily    Note to Pharmacy: Dispense in a 1/1/1 ratio. Use lidocaine, diphenhydramine, prednisolone   10/04/19 2007    doxycycline (VIBRA-TABS) 100 MG tablet  2 times daily     10/04/19 2010              This chart was dictated using voice recognition software/Dragon. Despite best efforts to proofread, errors can occur which can change the meaning. Any change was purely unintentional.    Darletta Moll, PA-C 10/04/19 2010    Arta Silence, MD 10/04/19 2012

## 2019-10-04 NOTE — ED Triage Notes (Signed)
Pt to ED from home c/o right sided dental pain.  Had two teeth pulled this past Thursday.

## 2019-10-07 ENCOUNTER — Ambulatory Visit: Payer: Self-pay | Admitting: Licensed Clinical Social Worker

## 2019-10-14 ENCOUNTER — Other Ambulatory Visit: Payer: Self-pay

## 2019-10-14 ENCOUNTER — Other Ambulatory Visit: Payer: Self-pay | Admitting: Urology

## 2019-10-14 ENCOUNTER — Ambulatory Visit: Payer: Self-pay

## 2019-10-14 DIAGNOSIS — K739 Chronic hepatitis, unspecified: Secondary | ICD-10-CM

## 2019-10-19 ENCOUNTER — Telehealth: Payer: Self-pay | Admitting: Pharmacist

## 2019-10-19 ENCOUNTER — Ambulatory Visit: Payer: Self-pay

## 2019-10-19 NOTE — Telephone Encounter (Signed)
10/19/2019 2:57:36 PM - Ventolin & Advair pending  -- Rhetta Mura - Tuesday, Oct 19, 2019 2:55 PM --I have received the signed script back from [place], she gave OK on the Advair for patient to continue with 250/50. On 10/13/2019 I put form for patient to sign for [facility] in bag with meds-a friend picked up patient's med on 10/15/19 and patient has not returned this form to [place] yet. Unable to order until we receive back from patient.

## 2019-10-28 ENCOUNTER — Ambulatory Visit: Payer: Self-pay | Admitting: Gerontology

## 2019-10-28 ENCOUNTER — Other Ambulatory Visit: Payer: Self-pay

## 2019-10-28 DIAGNOSIS — F17200 Nicotine dependence, unspecified, uncomplicated: Secondary | ICD-10-CM | POA: Insufficient documentation

## 2019-10-28 DIAGNOSIS — J4530 Mild persistent asthma, uncomplicated: Secondary | ICD-10-CM

## 2019-10-28 DIAGNOSIS — J45909 Unspecified asthma, uncomplicated: Secondary | ICD-10-CM | POA: Insufficient documentation

## 2019-10-28 DIAGNOSIS — M25562 Pain in left knee: Secondary | ICD-10-CM | POA: Insufficient documentation

## 2019-10-28 MED ORDER — MELOXICAM 7.5 MG PO TABS: 7.5000 mg | ORAL_TABLET | Freq: Every day | ORAL | 0 refills | Status: DC

## 2019-10-28 MED ORDER — BREO ELLIPTA 100-25 MCG/INH IN AEPB: 1.0000 | INHALATION_SPRAY | Freq: Every day | RESPIRATORY_TRACT | 2 refills | Status: DC

## 2019-10-28 NOTE — Progress Notes (Signed)
Established Patient Office Visit  Subjective:  Patient ID: Nathan Vasquez, male    DOB: April 23, 1981  Age: 39 y.o. MRN: [id number]  CC:  Chief Complaint  Patient presents with  . followup lab   Patient consents to telephone visit and 2 patient identifiers was used to identify patient. HPI DAVIUS GOUDEAU Vasquez presents for follow up of  Asthma and medication refill. He states that he continues to experience intermittent non productive cough, shortness of breath with exertion and wheezing. He uses Advair and states that he prefers [employer]. He continues to smoke 1/2 pack of cigarette daily and admits the desire to quit. He reports that taking Omeprazole relieves his acid reflux symptoms. He also reports that he has not followed up with Gastroenterology for Hep C treatment. Currently, he states that he has been experiencing intermittent  non radiating pain to his left knee and taking Meloxicam moderately relieves his pain.[name] He states that he has a cyst the size of a soft ball to the anterior surface of his right knee. He was seen at the ED for tooth ache on 10/04/2019 and states that pain has resolved. Overall, he states that he's doing well and offers no further complaint.  Past Medical History:  Diagnosis Date  . Asthma   . COPD (chronic obstructive pulmonary disease) ([place])   . Hep C w/o coma, chronic ([place])   . Heroin abuse ([place])   . History of balanitis     Past Surgical History:  Procedure Laterality Date  . TOOTH EXTRACTION      Family History  Problem Relation Age of Onset  . COPD Mother   . Diabetes Mother   . Heart disease Mother   . Anxiety disorder Mother   . Stroke Father   . Schizophrenia Father   . Diabetes Maternal Grandmother   . Diabetes Maternal Grandfather   . Cancer Maternal Grandfather   . Diabetes Paternal Grandmother   . Diabetes Paternal Grandfather   . Heart attack Paternal Grandfather     Social History   Socioeconomic History  .  Marital status: Soil scientist    Spouse name: Not on file  . Number of children: 1  . Years of education: 6th grade  . Highest education level: 6th grade  Occupational History  . Occupation: NA  Tobacco Use  . Smoking status: Current Every Day Smoker    Packs/day: 0.50    Types: Cigarettes  . Smokeless tobacco: Never Used  Substance and Sexual Activity  . Alcohol use: Yes    Alcohol/week: 1.0 standard drinks    Types: 1 Cans of beer per week    Comment: occasionally  . Drug use: Yes    Frequency: 7.0 times per week    Types: Heroin, Marijuana    Comment: heroin.  this morning, trying to quit but finds it difficult with pain/sickness  . Sexual activity: Yes    Partners: Female  Other Topics Concern  . Not on file  Social History Narrative  . Not on file   Social Determinants of Health   Financial Resource Strain: High Risk  . Difficulty of Paying Living Expenses: Very hard  Food Insecurity: Food Insecurity Present  . Worried About Charity fundraiser in the Last Year: Sometimes true  . Ran Out of Food in the Last Year: Sometimes true  Transportation Needs: No Transportation Needs  . Lack of Transportation (Medical): No  . Lack of Transportation (Non-Medical): No  Physical Activity:  Inactive  . Days of Exercise per Week: 0 days  . Minutes of Exercise per Session: 0 min  Stress: Stress Concern Present  . Feeling of Stress : Very much  Social Connections: Somewhat Isolated  . Frequency of Communication with Friends and Family: Three times a week  . Frequency of Social Gatherings with Friends and Family: Once a week  . Attends Religious Services: Never  . Active Member of Clubs or Organizations: No  . Attends Banker Meetings: Never  . Marital Status: Living with partner  Intimate Partner Violence: Not At Risk  . Fear of Current or Ex-Partner: No  . Emotionally Abused: No  . Physically Abused: No  . Sexually Abused: No    Outpatient Medications  Prior to Visit  Medication Sig Dispense Refill  . albuterol (PROVENTIL) (2.5 MG/3ML) 0.083% nebulizer solution Take 3 mLs (2.5 mg total) by nebulization every 6 (six) hours as needed for wheezing or shortness of breath. 75 mL 12  . albuterol (VENTOLIN HFA) 108 (90 Base) MCG/ACT inhaler Inhale 2 puffs into the lungs every 6 (six) hours as needed for wheezing or shortness of breath. 18 g 3  . Fluticasone-Salmeterol (ADVAIR DISKUS) 100-50 MCG/DOSE AEPB Inhale 1 puff into the lungs 2 (two) times daily. 60 each 3  . omeprazole (PRILOSEC) 20 MG capsule Take 1 capsule (20 mg total) by mouth daily. 90 capsule 0  . famotidine (PEPCID) 20 MG tablet Take 1 tablet (20 mg total) by mouth 2 (two) times daily. 60 tablet 0  . acetaminophen (TYLENOL) 325 MG tablet Take 325 mg by mouth every 6 (six) hours as needed.    [name] albuterol (ACCUNEB) 1.25 MG/3ML nebulizer solution Take 3 mLs (1.25 mg total) by nebulization every 6 (six) hours as needed for wheezing. 75 mL 12  . buprenorphine-naloxone (SUBOXONE) 8-2 mg SUBL SL tablet Place 1 tablet under the tongue 2 (two) times daily.    [name] dicyclomine (BENTYL) 20 MG tablet Take 1 tablet (20 mg total) by mouth 3 (three) times daily as needed for spasms. (Patient not taking: Reported on 10/28/2019) 30 tablet 0  . DULoxetine (CYMBALTA) 60 MG capsule Take 60 mg by mouth daily.    [name] ibuprofen (ADVIL,MOTRIN) 800 MG tablet Take 1 tablet (800 mg total) by mouth every 8 (eight) hours as needed. (Patient not taking: Reported on 10/28/2019) 15 tablet 0  . azithromycin (ZITHROMAX Z-PAK) 250 MG tablet Take 2 tablets (500 mg) on  Day 1,  followed by 1 tablet (250 mg) once daily on Days 2 through 5. (Patient not taking: Reported on 10/14/2019) 6 each 0  . cephALEXin (KEFLEX) 500 MG capsule Take 1 capsule (500 mg total) by mouth 3 (three) times daily. (Patient not taking: Reported on 10/14/2019) 30 capsule 0  . doxycycline (VIBRA-TABS) 100 MG tablet Take 1 tablet (100 mg total) by mouth 2 (two) times  daily. (Patient not taking: Reported on 10/14/2019) 14 tablet 0  . fluticasone (FLONASE) 50 MCG/ACT nasal spray Place 2 sprays into both nostrils daily. (Patient not taking: Reported on 10/14/2019) 16 g 0  . HYDROcodone-acetaminophen (NORCO/VICODIN) 5-325 MG tablet Take 1 tablet by mouth every 4 (four) hours as needed for moderate pain. (Patient not taking: Reported on 10/14/2019) 16 tablet 0  . magic mouthwash w/lidocaine SOLN Take 5 mLs by mouth 4 (four) times daily. (Patient not taking: Reported on 10/14/2019) 240 mL 0  . oxyCODONE-acetaminophen (ROXICET) 5-325 MG tablet Take 1 tablet by mouth every 6 (six) hours as needed. (  Patient not taking: Reported on 10/14/2019) 20 tablet 0  . predniSONE (DELTASONE) 5 MG tablet Take 1 tablet (5 mg total) by mouth daily with breakfast. (Patient not taking: Reported on 10/14/2019) 10 tablet 0  . traMADol (ULTRAM) 50 MG tablet Take 1 tablet (50 mg total) by mouth every 6 (six) hours as needed for moderate pain. (Patient not taking: Reported on 09/16/2017) 20 tablet 0   No facility-administered medications prior to visit.    Allergies  Allergen Reactions  . Penicillins Rash    ROS Review of Systems  Constitutional: Negative.   Respiratory: Positive for cough, shortness of breath and wheezing. Negative for chest tightness.   Cardiovascular: Negative.   Neurological: Negative.       Objective:    Physical Exam No physical exam was done There were no vitals taken for this visit. Wt Readings from Last 3 Encounters:  10/14/19 263 lb 14.4 oz (119.7 kg)  10/04/19 266 lb (120.7 kg)  09/30/19 266 lb 1.6 oz (120.7 kg)   He was advised to continue on his weight loss regimen.  Health Maintenance Due  Topic Date Due  . HIV Screening  Never done  . TETANUS/TDAP  Never done  . COVID-19 Vaccine (2 - Pfizer 2-dose series) 10/16/2019    There are no preventive care reminders to display for this patient.  Lab Results  Component Value Date   TSH 1.120  09/30/2019   Lab Results  Component Value Date   WBC 6.7 09/30/2019   HGB 16.0 09/30/2019   HCT 47.0 09/30/2019   MCV 90 09/30/2019   PLT 154 09/30/2019   Lab Results  Component Value Date   NA 144 09/30/2019   K 4.4 09/30/2019   CO2 26 09/30/2019   GLUCOSE 70 09/30/2019   BUN 7 09/30/2019   CREATININE 0.87 09/30/2019   BILITOT 0.2 09/30/2019   ALKPHOS 118 (H) 09/30/2019   AST 18 09/30/2019   ALT 35 09/30/2019   PROT 6.4 09/30/2019   ALBUMIN 4.1 09/30/2019   CALCIUM 9.2 09/30/2019   ANIONGAP 8 01/16/2019   Lab Results  Component Value Date   CHOL 158 09/30/2019   Lab Results  Component Value Date   HDL 55 09/30/2019   Lab Results  Component Value Date   LDLCALC 75 09/30/2019   Lab Results  Component Value Date   TRIG 167 (H) 09/30/2019   Lab Results  Component Value Date   CHOLHDL 2.9 09/30/2019   Lab Results  Component Value Date   HGBA1C 5.1 09/30/2019      Assessment & Plan:    1. Mild persistent asthma, unspecified whether complicated - He was advised to continue on current treatment regimen, because it will take 4-6 weeks for Breo to be available at the Pharmacy through the PAP program. - fluticasone furoate-vilanterol (BREO ELLIPTA) 100-25 MCG/INH AEPB; Inhale 1 puff into the lungs daily.  Dispense: 1 each; Refill: 2  2. Chronic pain of left knee - He will continue on Meloxicam, was educated on medication side effects and advised to notify clinic. He will follow up with [facility] Dr Justice Rocher. - meloxicam (MOBIC) 7.5 MG tablet; Take 1 tablet (7.5 mg total) by mouth daily.  Dispense: 30 tablet; Refill: 0  3. Smoking - He was strongly encouraged on smoking cessation.    Follow-up: Return in about 9 weeks (around 12/30/2019), or if symptoms worsen or fail to improve.    Navi Ewton Trellis Paganini, NP

## 2019-10-28 NOTE — Patient Instructions (Signed)
Chronic Knee Pain, Adult Knee pain that lasts longer than 3 months is called chronic knee pain. You may have pain in one or both knees. Symptoms of chronic knee pain may also include swelling and stiffness. The most common cause is age-related wear and tear (osteoarthritis) of your knee joint. Many conditions can cause chronic knee pain. Treatment depends on the cause. The main treatments are physical therapy and weight loss. It may also be treated with medicines, injections, a knee sleeve or brace, and by using crutches. Rest, ice, compression (pressure), and elevation (RICE) therapy may also be recommended. Follow these instructions at home: If you have a knee sleeve or brace:   Wear it as told by your doctor. Remove it only as told by your doctor.  Loosen it if your toes: ? Tingle. ? Become numb. ? Turn cold and blue.  Keep it clean.  If the sleeve or brace is not waterproof: ? Do not let it get wet. ? Remove it if told by your doctor, or cover it with a watertight covering when you take a bath or shower. Managing pain, stiffness, and swelling      If told, put heat on your knee. Do this as often as told by your doctor. Use the heat source that your doctor recommends, such as a moist heat pack or a heating pad. ? If you have a removable sleeve or brace, remove it as told by your doctor. ? Place a towel between your skin and the heat source. ? Leave the heat on for 20-30 minutes. ? Remove the heat if your skin turns bright red. This is very important if you are unable to feel pain, heat, or cold. You may have a greater risk of getting burned.  If told, put ice on your knee. ? If you have a removable sleeve or brace, remove it as told by your doctor. ? Put ice in a plastic bag. ? Place a towel between your skin and the bag. ? Leave the ice on for 20 minutes, 2-3 times a day.  Move your toes often.  Raise (elevate) the injured area above the level of your heart while you are  sitting or lying down. Activity  Avoid activities where both feet leave the ground at the same time (high-impact activities). Examples are running, jumping rope, and doing jumping jacks.  Return to your normal activities as told by your doctor. Ask your doctor what activities are safe for you.  Follow the exercise plan that your doctor makes for you. Your doctor may suggest that you: ? Avoid activities that make knee pain worse. You may need to change the exercises that you do, the sports that you participate in, or your job duties. ? Wear shoes with cushioned soles. ? Avoid high-impact activities or sports that require running and sudden changes in direction. ? Do exercises or physical therapy as told by your doctor. Physical therapy is planned to match your needs and abilities. ? Do exercises that increase your balance and strength, such as tai chi and yoga.  Do not use your injured knee to support your body weight until your doctor says that you can. Use crutches, a cane, or a walker, as told by your doctor. General instructions  Take over-the-counter and prescription medicines only as told by your doctor.  If you are overweight, work with your doctor and a food expert (dietitian) to set goals to lose weight. Being overweight can make your knee hurt more.  Do   not use any products that contain nicotine or tobacco, such as cigarettes, e-cigarettes, and chewing tobacco. If you need help quitting, ask your doctor.  Keep all follow-up visits as told by your doctor. This is important. Contact a doctor if:  You have knee pain that is not getting better or gets worse.  You are not able to do your exercises due to knee pain. Get help right away if:  Your knee swells and the swelling becomes worse.  You cannot move your knee.  You have very bad knee pain. Summary  Knee pain that lasts more than 3 months is considered chronic knee pain.  The main treatments for chronic knee pain are  physical therapy and weight loss. You may also need to take medicines, wear a knee sleeve or brace, use crutches, and put ice or heat on your knee.  Lose weight if you are overweight. Work with your doctor and a food expert (dietitian) to help you set goals to lose weight. Being overweight can make your knee hurt more.  Work with a physical therapist to make a safe exercise program, as told by your doctor. This information is not intended to replace advice given to you by your health care provider. Make sure you discuss any questions you have with your health care provider. Document Revised: 07/23/2018 Document Reviewed: 07/23/2018 Elsevier Patient Education  [place]. Smoking and Musculoskeletal Health Smoking is bad for your health. Most people know that smoking causes lung disease, heart disease, and cancer. But people may not realize that it also affects their bones, muscles, and joints (musculoskeletal system). When you smoke, the effects on your lungs and heart result in less oxygen for your musculoskeletal system. This can lead to poor bone and joint health. How can smoking affect my musculoskeletal health? Smoking can:  Increase your risk of having weak, thin bones (osteoporosis). Elderly smokers are at higher risk for bone fractures related to osteoporosis.  Decrease the ability of bone-forming cells to make and replace bone (in addition to reducing oxygen and blood flow).  Reduce your body's ability to absorb calcium from your diet. Less calcium means weaker bones.  Interfere with the breakdown of the male hormone estrogen. Smoking lowers estrogen, which is a hormone that helps keep bones strong. Women who smoke may have earlier menopause. Menopause is a risk factor for osteoporosis.  Weaken the tissues that attach bones to muscles (tendons). This can lead to shoulder, back, and other joint injuries.  Increase your risk of rheumatoid arthritis or make the condition worse  if you already have it.  Slow down healing and increase your risk of infection and other complications if you have a bone fracture or surgery that involves your musculoskeletal system.  Make you get out of breath easily. This can keep you from getting the exercise you need to keep your bones and joints healthy.  Decrease your appetite and body mass. You may lose weight and muscle strength. This can put you at higher risk for muscle injury, joint injury, and broken bones. What actions can I take to prevent musculoskeletal problems? Quit smoking      Do not start smoking. Quit if you already do. Even stopping later in life can improve musculoskeletal health.  Do not use any products that contain nicotine or tobacco. Do not replace cigarette smoking with e-cigarettes. The safety of e-cigarettes is not known, and some may contain harmful chemicals.  Make a plan to quit smoking and commit to it.  Look for programs to help you, and ask your health care provider for recommendations and ideas.  Talk with your health care provider about using nicotine replacement medicines to help you quit, such as gum, lozenges, patches, sprays, or pills. Make other lifestyle changes   Eat a healthy diet that includes calcium and vitamin D. These nutrients are important for bone health. ? Calcium is found in dairy foods and green leafy vegetables. ? Vitamin D is found in eggs, fish, and liver. ? Many foods also have vitamin D and calcium added to them (are fortified). ? Ask your health care provider if you would benefit from taking a supplement.  Get out in the sunshine for a short time every day. This increases production of vitamin D.  Get 30 minutes of exercise at least 5 days a week. Weight-bearing and strength exercises are best for musculoskeletal health. Ask your health care provider what type of exercise is safe for you.  Do not drink alcohol if: ? Your health care provider tells you not to  drink. ? You are pregnant, may be pregnant, or are planning to become pregnant.  If you drink alcohol, limit how much you have: ? 0-1 drink a day for women. ? 0-2 drinks a day for men.  Be aware of how much alcohol is in your drink. In the [place] one drink equals one 12 oz bottle of beer (355 mL), one 5 oz glass of wine (148 mL), or one 1 oz glass of hard liquor (44 mL). Where to find more information You may find more information about smoking, musculoskeletal health, and quitting smoking from:  [place] [employer]: orthoinfo.aaos.[place], Osteoporosis and Related [place]asis: bones.SouthExposed.es  HelpGuide.org: helpguide.org  https://hall.com/: smokefree.gov  American Lung Association: lung.org Contact a health care provider if:  You need help to quit smoking. Summary  When you smoke, the effects on your lungs and heart result in less oxygen for your musculoskeletal system.  Even stopping smoking later in life can improve musculoskeletal health.  Do not use any products that contain nicotine or tobacco, such as cigarettes and e-cigarettes.  If you need help quitting, ask your health care provider. This information is not intended to replace advice given to you by your health care provider. Make sure you discuss any questions you have with your health care provider. Document Revised: 02/05/2019 Document Reviewed: 09/08/2017 Elsevier Patient Education  [place].

## 2019-11-02 ENCOUNTER — Telehealth: Payer: Self-pay | Admitting: General Practice

## 2019-11-02 NOTE — Telephone Encounter (Signed)
Appointments were scheduled for follow up and [place].

## 2019-11-08 ENCOUNTER — Ambulatory Visit: Payer: Self-pay | Admitting: Pharmacist

## 2019-11-08 DIAGNOSIS — Z79899 Other long term (current) drug therapy: Secondary | ICD-10-CM

## 2019-11-08 NOTE — Progress Notes (Signed)
  [facility] Visit Note  Patient: MASTER TOUCHET MRN: [id number] Date of Birth: 14-Nov-1980 PCP: System, Pcp Not In   [name] 39 y.o. male presents for a MTM visit today.  Patient identity confirmed by name and DOB.  There were no vitals taken for this visit.  Patient Information   Past Medical History:  Diagnosis Date  . Asthma   . COPD (chronic obstructive pulmonary disease) (HCC)   . Hep C w/o coma, chronic (HCC)   . Heroin abuse (HCC)   . History of balanitis       Past Surgical History:  Procedure Laterality Date  . TOOTH EXTRACTION       Family History  Problem Relation Age of Onset  . COPD Mother   . Diabetes Mother   . Heart disease Mother   . Anxiety disorder Mother   . Stroke Father   . Schizophrenia Father   . Diabetes Maternal Grandmother   . Diabetes Maternal Grandfather   . Cancer Maternal Grandfather   . Diabetes Paternal Grandmother   . Diabetes Paternal Grandfather   . Heart attack Paternal Grandfather      Social History   Substance and Sexual Activity  Alcohol Use Yes  . Alcohol/week: 1.0 standard drink  . Types: 1 Cans of beer per week   Comment: occasionally      Social History   Tobacco Use  Smoking Status Current Every Day Smoker  . Packs/day: 0.50  . Types: Cigarettes  Smokeless Tobacco Never Used      Health Maintenance  Topic Date Due  . Hepatitis C Screening  Never done  . HIV Screening  Never done  . TETANUS/TDAP  Never done  . COVID-19 Vaccine (2 - Pfizer 2-dose series) 10/16/2019  . INFLUENZA VACCINE  12/26/2019     Assessment and Plan:  Adherence:  Patient is knowledgeable about all his medications, and is able to list them by name.  (A little less familiar with some of the dosages).     Hepatitis C:  Not currently treated.  Patient currently waiting for grant paperwork from [facility].  Encouraged him to reach back to [facility].   COPD/Asthma:  Patient does not have a specialist,  and follows up at [facility].  He currently takes albuterol (inhaler and nebulizer), and is using Advair in the meantime while waiting for Breo to be shipped from the drug manufacturer for PAP.     Neck Pain:  Patient reports swollen lymph nodes in neck for the last 10 years.  Currently being treated with meloxicam.   Follow Up:  1 year  Roylene Reason, PharmD  11/08/19

## 2019-11-09 ENCOUNTER — Other Ambulatory Visit: Payer: Self-pay

## 2019-11-23 ENCOUNTER — Ambulatory Visit: Payer: Self-pay | Admitting: Specialist

## 2019-11-23 ENCOUNTER — Other Ambulatory Visit: Payer: Self-pay

## 2019-11-23 DIAGNOSIS — M25562 Pain in left knee: Secondary | ICD-10-CM
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
## 2019-12-06 ENCOUNTER — Other Ambulatory Visit: Payer: Self-pay

## 2019-12-06 ENCOUNTER — Ambulatory Visit
Admission: RE | Admit: 2019-12-06 | Discharge: 2019-12-06 | Disposition: A | Discharge status: Home / Self Care | Payer: Self-pay | Attending: Specialist | Admitting: Specialist

## 2019-12-06 ENCOUNTER — Ambulatory Visit
Admission: RE | Admit: 2019-12-06 | Discharge: 2019-12-06 | Disposition: A | Discharge status: Home / Self Care | Payer: Self-pay | Source: Ambulatory Visit | Attending: Specialist | Admitting: Specialist

## 2019-12-06 DIAGNOSIS — M25562 Pain in left knee: Secondary | ICD-10-CM | POA: Insufficient documentation

## 2019-12-06 DIAGNOSIS — G8929 Other chronic pain: Secondary | ICD-10-CM | POA: Insufficient documentation

## 2019-12-07 ENCOUNTER — Ambulatory Visit: Payer: Self-pay | Admitting: Specialist

## 2019-12-07 DIAGNOSIS — M25562 Pain in left knee: Secondary | ICD-10-CM

## 2019-12-07 NOTE — Progress Notes (Signed)
   Subjective:    Patient ID: Nathan Vasquez, male    DOB: Nov 18, 1980, 39 y.o.   MRN: [id number]  HPI There is no change, he's here to review imaging.  X-rays show what could be an osteochondroma.   Review of Systems     Objective:   Physical Exam He does have a palpable "bump" on the anterior medial aspect of his tibia that probably represents what we see on the x-ray       Assessment & Plan:  I would suggest no treatment for this. Feeling that the treatment may leave him worse off. I've reviewed the x-rays with him and he agrees with no treatment. Return PRN

## 2019-12-21 ENCOUNTER — Other Ambulatory Visit: Payer: Self-pay | Admitting: Gerontology

## 2019-12-21 DIAGNOSIS — G8929 Other chronic pain: Secondary | ICD-10-CM

## 2019-12-30 ENCOUNTER — Ambulatory Visit: Payer: Self-pay

## 2020-01-13 ENCOUNTER — Encounter: Payer: Self-pay | Admitting: *Deleted

## 2020-01-13 ENCOUNTER — Ambulatory Visit: Payer: Self-pay | Admitting: Gastroenterology

## 2020-08-02 ENCOUNTER — Telehealth: Payer: Self-pay | Admitting: Pharmacist

## 2020-08-02 NOTE — Telephone Encounter (Signed)
Patient failed to provide requested 2022 financial documentation. Unable to determine patient's eligibility status. No additional medication assistance will be provided by [facility] without the required proof of income documentation. Patient notified by letter.  Vonda Henderson [facility] Administrative Assistant 

## 2020-10-13 ENCOUNTER — Other Ambulatory Visit: Payer: Self-pay

## 2021-04-05 ENCOUNTER — Other Ambulatory Visit: Payer: Self-pay

## 2021-05-08 IMAGING — CT CT RENAL STONE PROTOCOL
2 of 4 series · 16 of 46 positions shown, 18 images · non-contrast
Comparison: 09/25/2013

CLINICAL DATA: Right flank pain, chills and fever

EXAM:
CT ABDOMEN AND PELVIS WITHOUT CONTRAST
TECHNIQUE: Multidetector CT imaging of the abdomen and pelvis was performed
following the standard protocol without IV contrast.

[Series 2: stone full standard · axial · 0.89mm/px · z∈[-529,-104]mm · 13 of 93 slices shown, 15 images]
[im 4/93  soft-tissue]
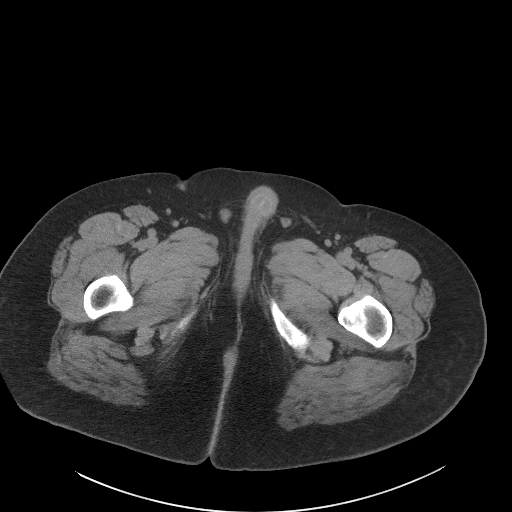
[im 4/93  bone]
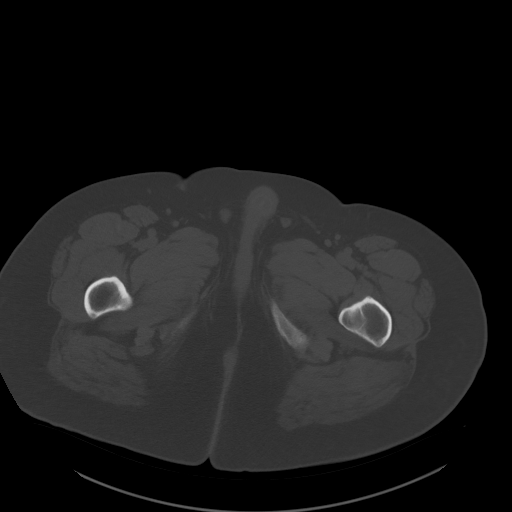
[im 12/93  soft-tissue]
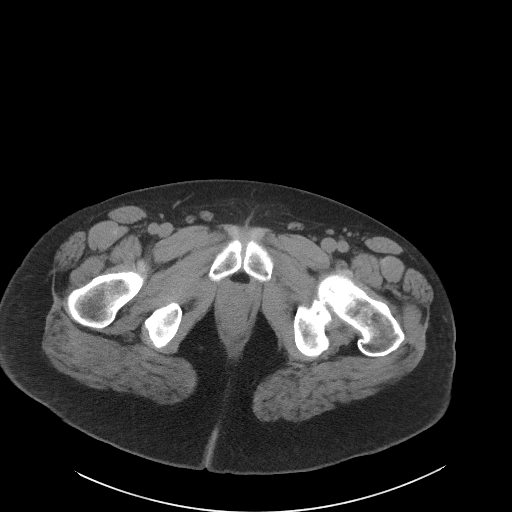
[im 20/93  soft-tissue]
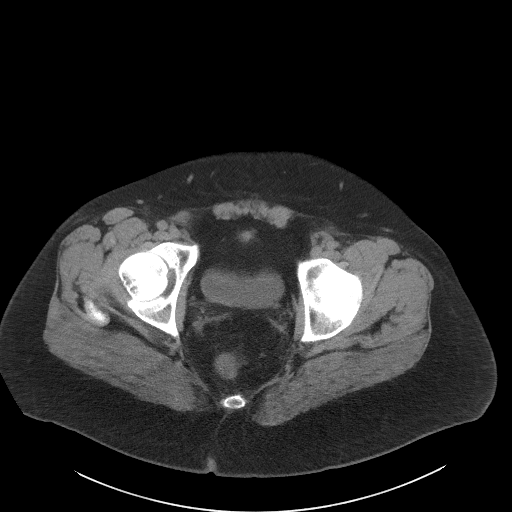
[im 27/93  soft-tissue]
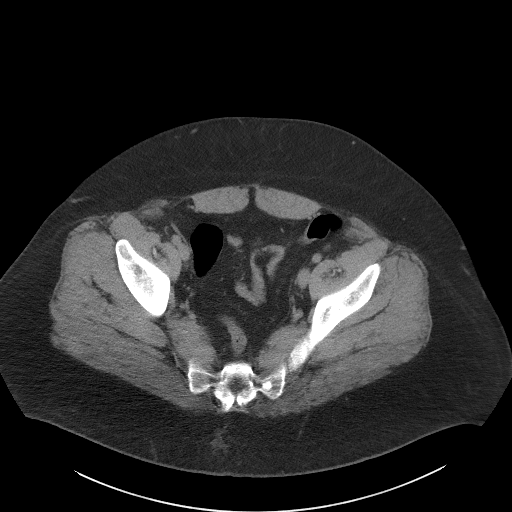
[im 31/93  soft-tissue]
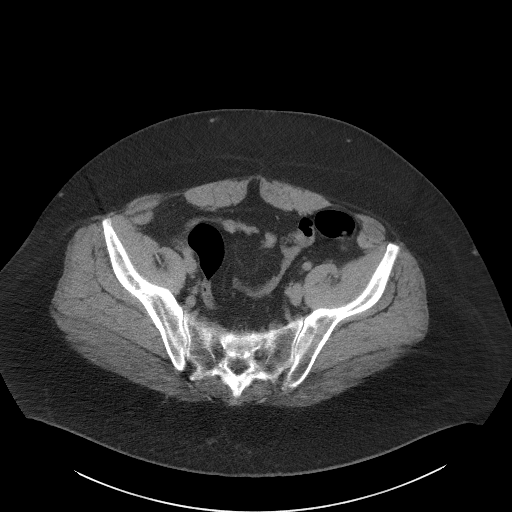
[im 39/93  soft-tissue]
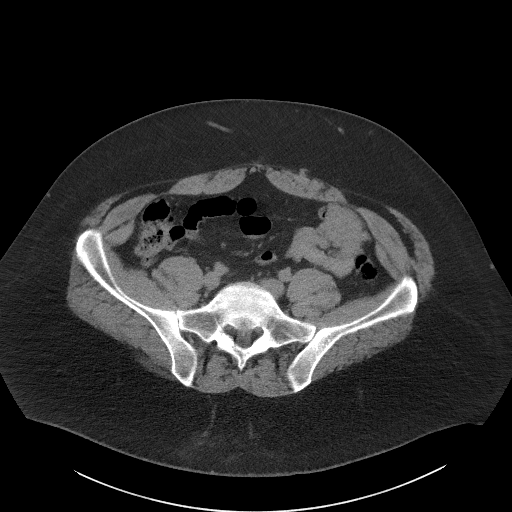
[im 47/93  soft-tissue]
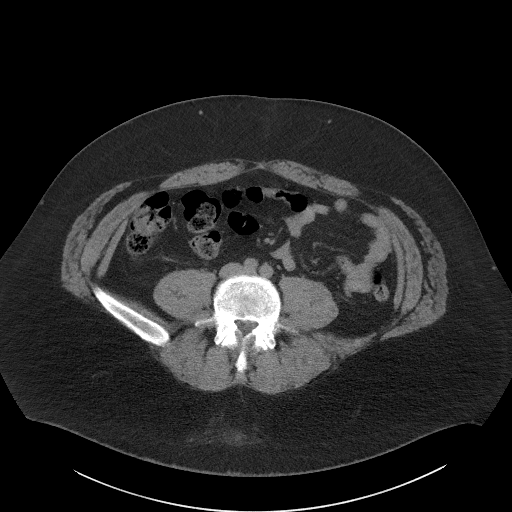
[im 54/93  soft-tissue]
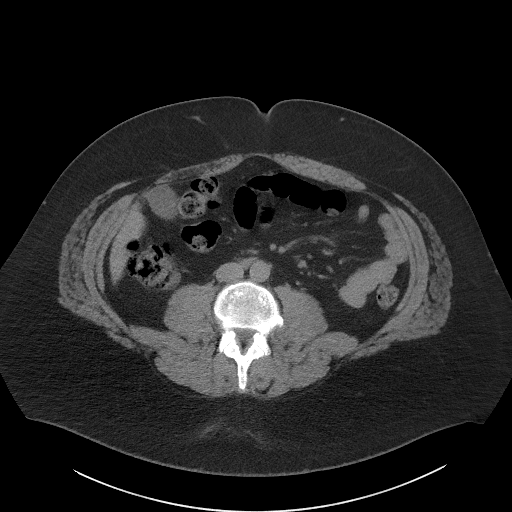
[im 62/93  soft-tissue]
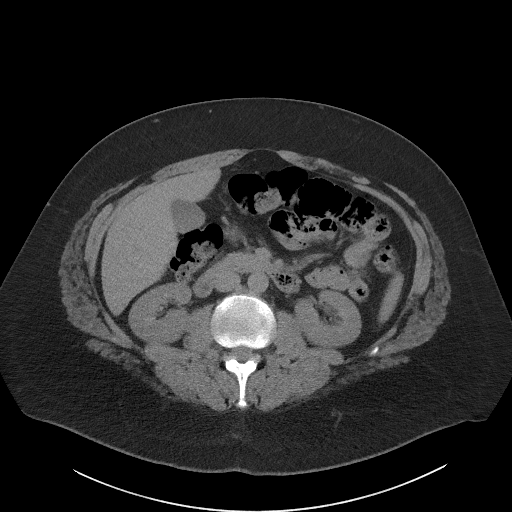
[im 62/93  bone]
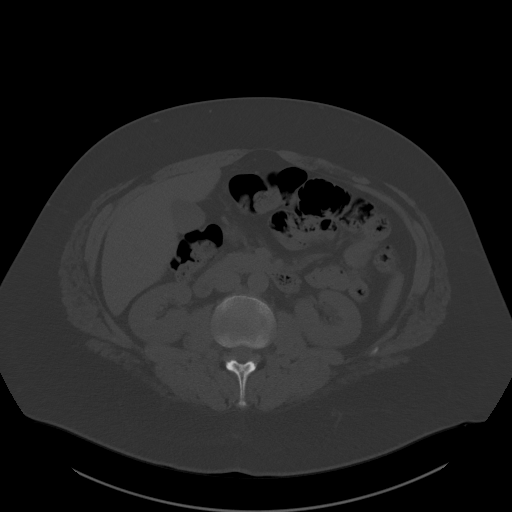
[im 66/93  soft-tissue]
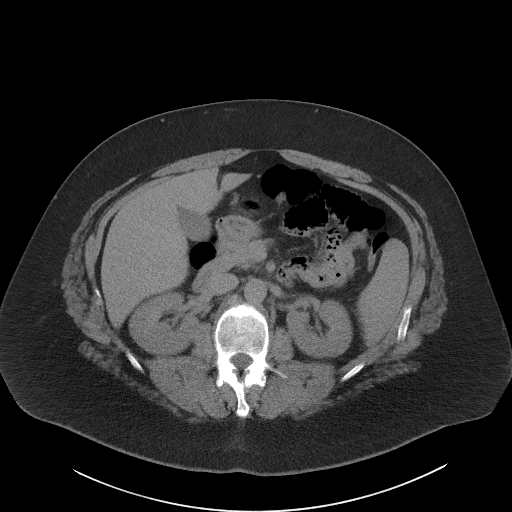
[im 73/93  soft-tissue]
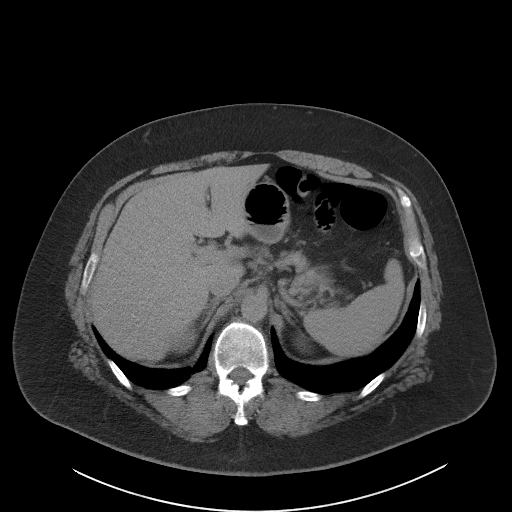
[im 81/93  soft-tissue]
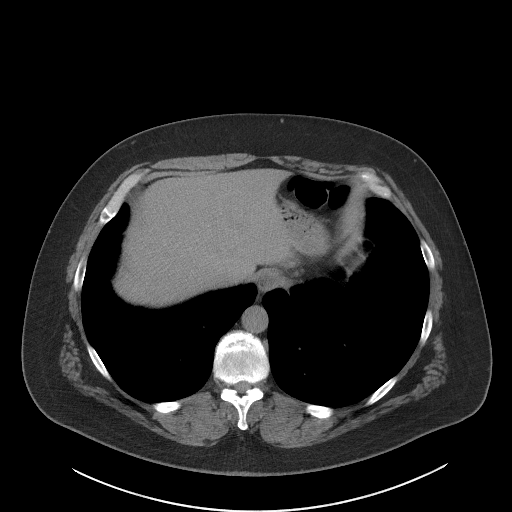
[im 89/93  soft-tissue]
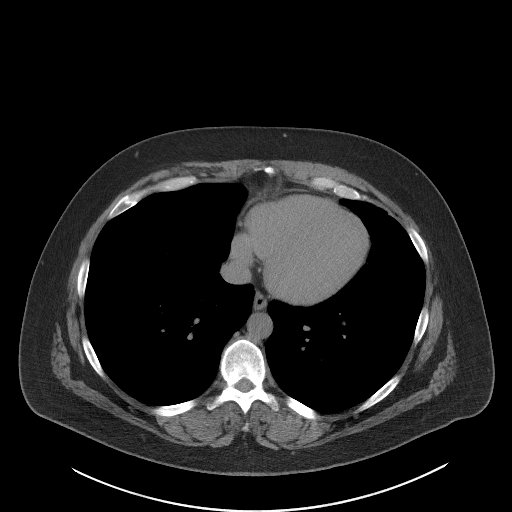

[Series 5: coronal · coronal · 0.80mm/px · 3 of 151 slices shown]
[im 51/151  soft-tissue]
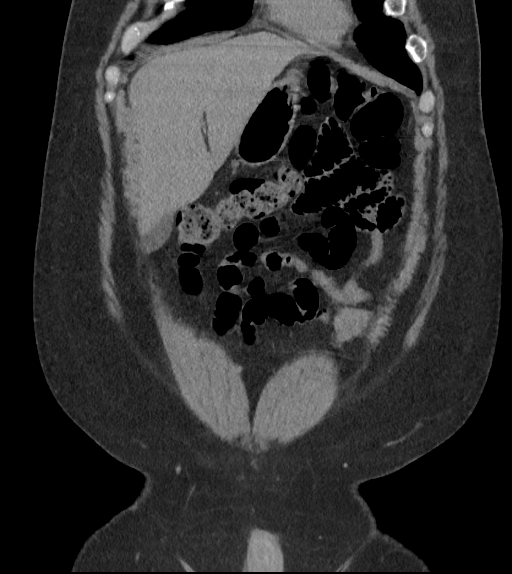
[im 67/151  soft-tissue]
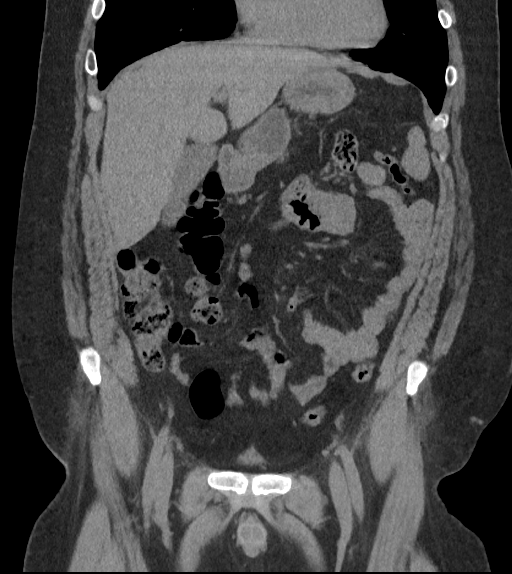
[im 84/151  soft-tissue]
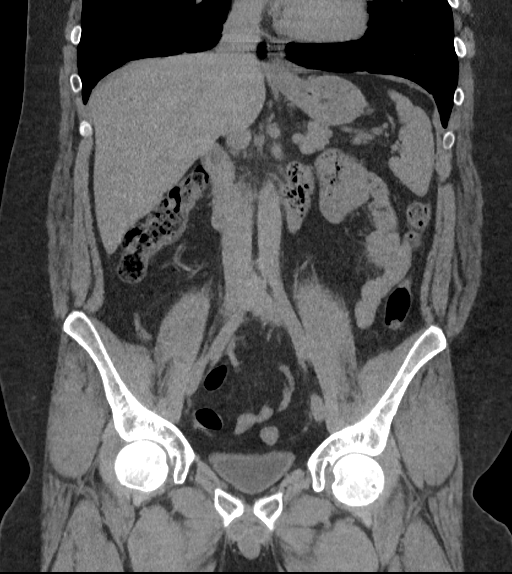

[16 of 46 positions shown; findings below may reference images not displayed]

FINDINGS: Lower chest: Minimal basilar atelectasis. Left lower lobe 5 mm
pulmonary nodule noted, image 16 series 4. No acute lower chest
finding. No pericardial or pleural effusion. Normal heart size.

Hepatobiliary: Limited without IV contrast. No large focal hepatic
abnormality or intrahepatic biliary dilatation. Gallbladder biliary
system unremarkable. Common bile duct nondilated.

Pancreas: Unremarkable. No pancreatic ductal dilatation or
surrounding inflammatory changes.

Spleen: Normal in size without focal abnormality.

Adrenals/Urinary Tract: Adrenal glands are unremarkable. Kidneys are
normal, without renal calculi, focal lesion, or hydronephrosis.
Bladder is unremarkable.

Stomach/Bowel: Stomach is within normal limits. Appendix appears
normal. No evidence of bowel wall thickening, distention, or
inflammatory changes.

Vascular/Lymphatic: Limited without IV contrast. No large aneurysm
or retroperitoneal hemorrhage. No bulky adenopathy.

Reproductive: Unremarkable by CT.

Other: No abdominal wall hernia or abnormality. No abdominopelvic
ascites.

Musculoskeletal: No acute or significant osseous findings. Minor
degenerative changes of the lower thoracic and lumbar spine.
IMPRESSION: No acute obstructing urinary tract calculus, hydronephrosis, or
acute obstructive uropathy.

No other acute intra-abdominopelvic finding by CT.

5 mm left lower lobe pulmonary nodule.

No follow-up needed if patient is low-risk. Non-contrast chest CT
can be considered in 12 months if patient is high-risk. This
recommendation follows the consensus statement: Guidelines for
Management of Incidental Pulmonary Nodules Detected on CT Images:

Interval progression of the mild thoracolumbar degenerative change.

## 2021-06-28 ENCOUNTER — Other Ambulatory Visit: Payer: Self-pay

## 2021-06-28 ENCOUNTER — Encounter: Payer: Self-pay | Admitting: Family

## 2021-06-28 ENCOUNTER — Ambulatory Visit (INDEPENDENT_AMBULATORY_CARE_PROVIDER_SITE_OTHER): Payer: Self-pay | Admitting: Family

## 2021-06-28 VITALS — BP 165/99 | HR 88 | Temp 98.2°F | Wt 263.0 lb

## 2021-06-28 DIAGNOSIS — B182 Chronic viral hepatitis C: Secondary | ICD-10-CM | POA: Insufficient documentation

## 2021-06-28 NOTE — Patient Instructions (Signed)
Nice to see you.  We will check your lab work today and call with the results.   Contact [facility] and to discuss suboxone - [phone]  Follow up pending lab work and 1 month after starting medication.  Have a great day and stay safe!

## 2021-06-28 NOTE — Assessment & Plan Note (Signed)
Nathan Vasquez is a 41 y/o male with chronic hepatitis C with risk factors of IV drug use and tattoos. He is treatment naive and currently has fatigue. We reviewed previous lab work and discussed the basics of Hepatitis C including the pathogenesis, transmission, risks if left untreated, medications including side effects, and financial assistance. Check blood work for Hepatitis C, HIV and Hepatitis B. Financial paperwork completed for medication and lab work. Plan for follow up 1 month after starting medication.

## 2021-06-28 NOTE — Progress Notes (Signed)
Subjective:    Patient ID: Nathan Vasquez, male    DOB: April 28, 1981, 41 y.o.   MRN: [id number]  Chief Complaint  Patient presents with   New Patient (Initial Visit)   Hepatitis C    HPI:  Nathan Vasquez is a 41 y.o. male with previous medical history of COPD, IV drug use, and asthma presenting today for evaluation and treatment of Hepatitis C.   Nathan Vasquez was initially diagnosed with Hepatitis C in 2015 with risk factors of IV drug use and homemade tattoos. Currently with fatigue and denies abdominal pain, nausea, vomiting, diarrhea, scleral icterus or jaundice. No personal history of liver disease with Aunt having cirrhosis from Hepatitis C obtained from drug use. Has not had treatment since initial diagnosis. Current tobacco use with no recreational or illicit drugs or alcohol. Interested in possibly starting suboxone to help him stay sober although has not been on any drugs in over a years since he was incarcerated.   Previous lab work reviewed with positive hepatitis C antibody in February 2019 with Hepatitis C RNA levels present in 2021 with the most recent being 02/08/21 with viral load of 504,450.    Allergies  Allergen Reactions   Penicillins Rash      Outpatient Medications Prior to Visit  Medication Sig Dispense Refill   acetaminophen (TYLENOL) 325 MG tablet Take 325 mg by mouth every 6 (six) hours as needed.     albuterol (ACCUNEB) 1.25 MG/3ML nebulizer solution Take 3 mLs (1.25 mg total) by nebulization every 6 (six) hours as needed for wheezing. 75 mL 12   albuterol (VENTOLIN HFA) 108 (90 Base) MCG/ACT inhaler Inhale 2 puffs into the lungs every 6 (six) hours as needed for wheezing or shortness of breath. 18 g 3   Fluticasone-Salmeterol (ADVAIR DISKUS) 100-50 MCG/DOSE AEPB Inhale 1 puff into the lungs 2 (two) times daily. 60 each 3   meloxicam (MOBIC) 7.5 MG tablet Take 1 tablet (7.5 mg total) by mouth daily. 30 tablet 0   omeprazole (PRILOSEC) 20 MG  capsule Take 1 capsule (20 mg total) by mouth daily. 90 capsule 0   buprenorphine-naloxone (SUBOXONE) 8-2 mg SUBL SL tablet Place 1 tablet under the tongue 2 (two) times daily. (Patient not taking: Reported on 11/08/2019)     albuterol (PROVENTIL) (2.5 MG/3ML) 0.083% nebulizer solution Take 3 mLs (2.5 mg total) by nebulization every 6 (six) hours as needed for wheezing or shortness of breath. (Patient not taking: Reported on 11/08/2019) 75 mL 12   dicyclomine (BENTYL) 20 MG tablet Take 1 tablet (20 mg total) by mouth 3 (three) times daily as needed for spasms. (Patient not taking: Reported on 10/28/2019) 30 tablet 0   DULoxetine (CYMBALTA) 60 MG capsule Take 60 mg by mouth daily. (Patient not taking: Reported on 11/08/2019)     fluticasone furoate-vilanterol (BREO ELLIPTA) 100-25 MCG/INH AEPB Inhale 1 puff into the lungs daily. (Patient not taking: Reported on 06/28/2021) 1 each 2   ibuprofen (ADVIL,MOTRIN) 800 MG tablet Take 1 tablet (800 mg total) by mouth every 8 (eight) hours as needed. (Patient not taking: Reported on 10/28/2019) 15 tablet 0   No facility-administered medications prior to visit.     Past Medical History:  Diagnosis Date   Asthma    COPD (chronic obstructive pulmonary disease) (HCC)    Hep C w/o coma, chronic (HCC)    Heroin abuse ([place])    History of balanitis       Past Surgical History:  Procedure Laterality Date   TOOTH EXTRACTION        Family History  Problem Relation Age of Onset   COPD Mother    Diabetes Mother    Heart disease Mother    Anxiety disorder Mother    Stroke Father    Schizophrenia Father    Diabetes Maternal Grandmother    Diabetes Maternal Grandfather    Cancer Maternal Grandfather    Diabetes Paternal Grandmother    Diabetes Paternal Grandfather    Heart attack Paternal Grandfather       Social History   Socioeconomic History   Marital status: Soil scientist    Spouse name: Not on file   Number of children: 1   Years of  education: 6th grade   Highest education level: 6th grade  Occupational History   Occupation: NA  Tobacco Use   Smoking status: Every Day    Packs/day: 0.50    Types: Cigarettes   Smokeless tobacco: Never  Vaping Use   Vaping Use: Former  Substance and Sexual Activity   Alcohol use: Yes    Alcohol/week: 1.0 standard drink    Types: 1 Cans of beer per week    Comment: occasionally   Drug use: Not Currently    Frequency: 7.0 times per week    Types: Heroin, Marijuana    Comment: heroin.  this morning, trying to quit but finds it difficult with pain/sickness   Sexual activity: Yes    Partners: Female  Other Topics Concern   Not on file  Social History Narrative   Not on file   Social Determinants of Health   Financial Resource Strain: Not on file  Food Insecurity: Not on file  Transportation Needs: Not on file  Physical Activity: Not on file  Stress: Not on file  Social Connections: Not on file  Intimate Partner Violence: Not on file      Review of Systems  Constitutional:  Positive for fatigue. Negative for chills, diaphoresis and fever.  Respiratory:  Negative for cough, chest tightness, shortness of breath and wheezing.   Cardiovascular:  Negative for chest pain.  Gastrointestinal:  Negative for abdominal distention, abdominal pain, constipation, diarrhea, nausea and vomiting.  Neurological:  Negative for weakness and headaches.  Hematological:  Does not bruise/bleed easily.      Objective:    BP (!) 165/99    Pulse 88    Temp 98.2 F (36.8 C) (Temporal)    Wt 263 lb (119.3 kg)    SpO2 97%    BMI 35.67 kg/m  Nursing note and vital signs reviewed.  Physical Exam Constitutional:      General: He is not in acute distress.    Appearance: He is well-developed.  Cardiovascular:     Rate and Rhythm: Normal rate and regular rhythm.     Heart sounds: Normal heart sounds. No murmur heard.   No friction rub. No gallop.  Pulmonary:     Effort: Pulmonary effort is  normal. No respiratory distress.     Breath sounds: Normal breath sounds. No wheezing or rales.  Chest:     Chest wall: No tenderness.  Abdominal:     General: Bowel sounds are normal. There is no distension.     Palpations: Abdomen is soft. There is no mass.     Tenderness: There is no abdominal tenderness. There is no guarding or rebound.  Skin:    General: Skin is warm and dry.  Neurological:     Mental  Status: He is alert and oriented to person, place, and time.  Psychiatric:        Behavior: Behavior normal.        Thought Content: Thought content normal.        Judgment: Judgment normal.        Assessment & Plan:   Patient Active Problem List   Diagnosis Date Noted   Chronic hepatitis C without hepatic coma ([place]) 06/28/2021   Asthma 10/28/2019   Left knee pain 10/28/2019   Smoking 10/28/2019   Generalized anxiety disorder 09/28/2019   Complaint of debility and malaise 08/04/2016   Mild intermittent asthma 08/04/2016   Opiate overdose ([place]) 08/04/2016     Problem List Items Addressed This Visit       Digestive   Chronic hepatitis C without hepatic coma ([place]) - Primary    Mr. Robledo is a 41 y/o male with chronic hepatitis C with risk factors of IV drug use and tattoos. He is treatment naive and currently has fatigue. We reviewed previous lab work and discussed the basics of Hepatitis C including the pathogenesis, transmission, risks if left untreated, medications including side effects, and financial assistance. Check blood work for Hepatitis C, HIV and Hepatitis B. Financial paperwork completed for medication and lab work. Plan for follow up 1 month after starting medication.       Relevant Orders   CBC   Hepatic function panel   Hepatitis B surface antibody,quantitative   Hepatitis B surface antigen   Hepatitis C genotype   Hepatitis C RNA quantitative   HIV Antibody (routine testing w rflx)   Liver Fibrosis, FibroTest-ActiTest   Protime-INR     I have  discontinued Nathan Vasquez's ibuprofen, DULoxetine, dicyclomine, and Breo Ellipta. I am also having him maintain his buprenorphine-naloxone, acetaminophen, albuterol, albuterol, Fluticasone-Salmeterol, omeprazole, and meloxicam.   Follow-up: Pending lab work and 1 month after starting medication.    Terri Piedra, MSN, FNP-C Nurse Practitioner [facility] [place] number: [phone]

## 2021-07-04 ENCOUNTER — Telehealth: Payer: Self-pay | Admitting: Family

## 2021-07-04 LAB — HEPATIC FUNCTION PANEL
AG Ratio: 1.9 (calc) (ref 1.0–2.5)
ALT: 31 U/L (ref 9–46)
AST: 22 U/L (ref 10–40)
Albumin: 4.2 g/dL (ref 3.6–5.1)
Alkaline phosphatase (APISO): 71 U/L (ref 36–130)
Bilirubin, Direct: 0.1 mg/dL (ref 0.0–0.2)
Globulin: 2.2 g/dL (calc) (ref 1.9–3.7)
Indirect Bilirubin: 0.2 mg/dL (calc) (ref 0.2–1.2)
Total Bilirubin: 0.3 mg/dL (ref 0.2–1.2)
Total Protein: 6.4 g/dL (ref 6.1–8.1)

## 2021-07-04 LAB — CBC
HCT: 44.7 % (ref 38.5–50.0)
Hemoglobin: 15.1 g/dL (ref 13.2–17.1)
MCH: 31.6 pg (ref 27.0–33.0)
MCHC: 33.8 g/dL (ref 32.0–36.0)
MCV: 93.5 fL (ref 80.0–100.0)
MPV: 12.6 fL — ABNORMAL HIGH (ref 7.5–12.5)
Platelets: 187 10*3/uL (ref 140–400)
RBC: 4.78 10*6/uL (ref 4.20–5.80)
RDW: 12.2 % (ref 11.0–15.0)
WBC: 7 10*3/uL (ref 3.8–10.8)

## 2021-07-04 LAB — LIVER FIBROSIS, FIBROTEST-ACTITEST
ALT: 33 U/L (ref 9–46)
Alpha-2-Macroglobulin: 139 mg/dL (ref 106–279)
Apolipoprotein A1: 205 mg/dL — ABNORMAL HIGH (ref 94–176)
Bilirubin: 0.4 mg/dL (ref 0.2–1.2)
Fibrosis Score: 0.07
GGT: 108 U/L — ABNORMAL HIGH (ref 3–95)
Haptoglobin: 134 mg/dL (ref 43–212)
Necroinflammat ACT Score: 0.12
Reference ID: 4218469

## 2021-07-04 LAB — HIV ANTIBODY (ROUTINE TESTING W REFLEX): HIV 1&2 Ab, 4th Generation: NONREACTIVE

## 2021-07-04 LAB — PROTIME-INR
INR: 0.9
Prothrombin Time: 9.7 s (ref 9.0–11.5)

## 2021-07-04 LAB — HEPATITIS C RNA QUANTITATIVE
HCV Quantitative Log: 6.37 log IU/mL — ABNORMAL HIGH
HCV RNA, PCR, QN: 2350000 IU/mL — ABNORMAL HIGH

## 2021-07-04 LAB — HEPATITIS C GENOTYPE

## 2021-07-04 LAB — HEPATITIS B SURFACE ANTIBODY, QUANTITATIVE: Hep B S AB Quant (Post): <5 m[IU]/mL — ABNORMAL LOW (ref >=10)

## 2021-07-04 LAB — HEPATITIS B SURFACE ANTIGEN: Hepatitis B Surface Ag: NONREACTIVE

## 2021-07-04 NOTE — Telephone Encounter (Signed)
Spoke with Mr. Kumpf after obtaining two unique identifiers regarding his results. He has Genotype 1a Chronic Hepatitis C with viral load of 2.35 million. Fibrosis score of F0 with APRI 0.294 and FIB4 0.85 indicating low risk of fibrosis. Plan for 8 weeks of Mavyret.

## 2021-07-05 ENCOUNTER — Telehealth: Payer: Self-pay

## 2021-07-05 NOTE — Telephone Encounter (Signed)
[facility] Patient Advocate Encounter  Completed and sent MYABBVIE application for Mavyret for this patient who is uninsured.    Patient assistance phone number for follow up is [phone].   This encounter will be updated until final determination.   Mialani Reicks, CPhT Specialty Pharmacy Patient Advocate [facility] Phone: [phone] Fax:  [phone]  

## 2021-07-09 ENCOUNTER — Telehealth: Payer: Self-pay

## 2021-07-09 NOTE — Telephone Encounter (Addendum)
[facility] Patient Advocate Encounter  Completed and sent MYABBVIE application for Mavyret for this patient who is uninsured.    Patient is approved 07/09/21 through 01/06/22.  Medication will be delivered to the clinic on 07/25/21.  I will call the patient once medication is here and he will come pick up med .  I will make a 1 month follow up when patient picks up.   Clearance Coots, CPhT Specialty Pharmacy Patient [facility] Phone: [phone] Fax:  [phone]

## 2021-07-13 NOTE — Telephone Encounter (Signed)
Thank you Nathan Vasquez! [name] or I can counsel him when he is here as well.

## 2021-07-25 ENCOUNTER — Telehealth: Payer: Self-pay

## 2021-07-25 NOTE — Telephone Encounter (Signed)
[facility] Patient Advocate Encounter ? ?Patient's medications have been couriered to [facility] from [facility] and will be picked up 03/012/23. ? ?When patient come pick up I will make him a 1 month follow up. ? ?Clearance Coots , CPhT ?Specialty Pharmacy Patient Advocate ?[facility] ?Phone: [phone] ?Fax:  [phone]  ?

## 2021-07-26 ENCOUNTER — Telehealth: Payer: Self-pay | Admitting: Pharmacist

## 2021-07-26 NOTE — Telephone Encounter (Signed)
Patient is approved to receive Mavyret x 8 weeks for chronic Hepatitis C infection. Counseled patient to take all three tablets of Mavyret daily with food.  Counseled patient the need to take all three tablets together and to not separate them out during the day. Encouraged patient not to miss any doses and explained how their chance of cure could go down with each dose missed. Counseled patient on what to do if dose is missed - if it is closer to the missed dose take immediately; if closer to next dose then skip dose and take the next dose at the usual time. Counseled patient on common side effects such as headache, fatigue, and nausea and that these normally decrease with time. I reviewed patient medications and found no drug interactions. Discussed with patient that there are several drug interactions with Mavyret and instructed patient to call the clinic if he wishes to start a new medication during course of therapy. Also advised patient to call if he experiences any side effects. Patient will follow-up with me in the pharmacy clinic on 3/30. ? ?

## 2021-07-31 ENCOUNTER — Other Ambulatory Visit: Payer: Self-pay

## 2021-07-31 ENCOUNTER — Ambulatory Visit: Payer: Self-pay | Admitting: Gerontology

## 2021-07-31 ENCOUNTER — Encounter: Payer: Self-pay | Admitting: Gerontology

## 2021-07-31 VITALS — BP 138/86 | HR 81 | Temp 97.6°F | Resp 17 | Ht 70.0 in | Wt 259.3 lb

## 2021-07-31 DIAGNOSIS — F17200 Nicotine dependence, unspecified, uncomplicated: Secondary | ICD-10-CM

## 2021-07-31 DIAGNOSIS — H538 Other visual disturbances: Secondary | ICD-10-CM

## 2021-07-31 DIAGNOSIS — B182 Chronic viral hepatitis C: Secondary | ICD-10-CM

## 2021-07-31 DIAGNOSIS — F411 Generalized anxiety disorder: Secondary | ICD-10-CM

## 2021-07-31 DIAGNOSIS — I10 Essential (primary) hypertension: Secondary | ICD-10-CM

## 2021-07-31 DIAGNOSIS — R062 Wheezing: Secondary | ICD-10-CM

## 2021-07-31 DIAGNOSIS — Z7689 Persons encountering health services in other specified circumstances: Secondary | ICD-10-CM

## 2021-07-31 DIAGNOSIS — IMO0001 Reserved for inherently not codable concepts without codable children: Secondary | ICD-10-CM

## 2021-07-31 MED ORDER — AMLODIPINE BESYLATE 10 MG PO TABS
10.0000 mg | ORAL_TABLET | Freq: Every day | ORAL | 1 refills | Status: DC
  Filled 2021-07-31: qty 30, 30d supply, fill #0
  Filled 2021-09-11: qty 30, 30d supply, fill #1
  Filled 2021-10-09: qty 30, 30d supply, fill #2
  Filled 2021-11-08: qty 30, 30d supply, fill #0
  Filled 2021-11-08: qty 30, 30d supply, fill #3
  Filled 2021-12-02: qty 30, 30d supply, fill #1

## 2021-07-31 MED ORDER — FLUTICASONE-SALMETEROL 100-50 MCG/ACT IN AEPB
1.0000 | INHALATION_SPRAY | Freq: Two times a day (BID) | RESPIRATORY_TRACT | 2 refills | Status: DC
  Filled 2021-07-31: qty 60, 30d supply, fill #0

## 2021-07-31 MED ORDER — ALBUTEROL SULFATE HFA 108 (90 BASE) MCG/ACT IN AERS
1.0000 | INHALATION_SPRAY | Freq: Four times a day (QID) | RESPIRATORY_TRACT | 3 refills | Status: DC | PRN
  Filled 2021-07-31: qty 6.7, 25d supply, fill #0
  Filled 2021-09-11: qty 6.7, 25d supply, fill #1
  Filled 2021-11-01: qty 6.7, 25d supply, fill #0
  Filled 2021-12-02: qty 6.7, 25d supply, fill #1

## 2021-07-31 NOTE — Progress Notes (Signed)
Established Patient Office Visit  Subjective:  Patient ID: Nathan Vasquez, male    DOB: Jul 12, 1980  Age: 41 y.o. MRN: [id number]  CC:  Chief Complaint  Patient presents with   re-establish care   Back Pain    Patient c/o back pain x 1 week. No injury noted.   Hypertension    Taking Amlodipine 80m  1 daily   Hepatitis C    Taking Mavyret 1077m40mg    HPI Nathan Vasquez  is a [age]/o male, who has history of Asthma, COPD, Hepatitis C, Heroin use , and History of balanitis, presents to re-establish care. He has a history of hypertension, and takes Amlodipine 10 mg daily. He reports experiencing intermittent shortness of breath with exertion, wheezing, chest tightness, non productive cough that has being going on for 1 month. He continues to use Advair and albuterol as needed. He states that he smokes 1 pack of cigarette every other day and admits the desire to quit. He has a history of chronic Hepatitis C,was seen at the Infectious Disease clinic on 06/28/21 by CaElna BreslowG.D FNP, started Mavyret 100-40 mg on 07/27/21, and will follow up on 08/23/21. He states that he has a history of depression and takes Cymbalta, but it was discontinued  sto 8 months ago while he was incarcerated. He  states that his mood is good, denies suicidal nor homicidal ideation. He also c/o fatigue, lower flank pain states that he's not being drinking enough water and thinks that his kidney might be affected. Overall, he states that he's doing well and offers no further complaint.   Past Medical History:  Diagnosis Date   Asthma    COPD (chronic obstructive pulmonary disease) (HCC)    Hep C w/o coma, chronic (HCC)    [place]   History of balanitis    Hypertension     Past Surgical History:  Procedure Laterality Date   TOOTH EXTRACTION      Family History  Problem Relation Age of Onset   COPD Mother    Diabetes Mother    Heart disease Mother    Anxiety disorder Mother    Stroke  Father    Schizophrenia Father    Diabetes Maternal Grandmother    Diabetes Maternal Grandfather    Cancer Maternal Grandfather    Diabetes Paternal Grandmother    Diabetes Paternal Grandfather    Heart attack Paternal Grandfather     Social History   Socioeconomic History   Marital status: DoSoil scientist  Spouse name: Not on file   Number of children: 1   Years of education: 6th grade   Highest education level: 6th grade  Occupational History   Occupation: NA  Tobacco Use   Smoking status: Every Day    Packs/day: 0.50    Types: Cigarettes   Smokeless tobacco: Never  Vaping Use   Vaping Use: Former   Quit date: 12/21/2019  Substance and Sexual Activity   Alcohol use: Not Currently    Alcohol/week: 1.0 standard drink    Types: 1 Cans of beer per week    Comment: last use 12/21/19   Drug use: Not Currently    Frequency: 7.0 times per week    Types: Heroin, Marijuana    Comment: last use 12/21/19  heroin.  this morning, trying to quit but finds it difficult with pain/sickness   Sexual activity: Yes    Partners: Female  Other Topics Concern  Not on file  Social History Narrative   Not on file   Social Determinants of Health   Financial Resource Strain: Not on file  Food Insecurity: Food Insecurity Present   Worried About [place] in the Last Year: Sometimes true   Ran Out of Food in the Last Year: Sometimes true  Transportation Needs: No Transportation Needs   Lack of Transportation (Medical): No   Lack of Transportation (Non-Medical): No  Physical Activity: Not on file  Stress: Not on file  Social Connections: Not on file  Intimate Partner Violence: Not on file    Outpatient Medications Prior to Visit  Medication Sig Dispense Refill   Glecaprevir-Pibrentasvir (MAVYRET) 100-40 MG TABS Take 3 tablets by mouth daily.     meloxicam (MOBIC) 7.5 MG tablet Take 1 tablet (7.5 mg total) by mouth daily. 30 tablet 0   omeprazole (PRILOSEC) 20 MG capsule  Take 1 capsule (20 mg total) by mouth daily. 90 capsule 0   albuterol (ACCUNEB) 1.25 MG/3ML nebulizer solution Take 3 mLs (1.25 mg total) by nebulization every 6 (six) hours as needed for wheezing. 75 mL 12   albuterol (VENTOLIN HFA) 108 (90 Base) MCG/ACT inhaler Inhale 2 puffs into the lungs every 6 (six) hours as needed for wheezing or shortness of breath. 18 g 3   amLODipine (NORVASC) 10 MG tablet Take 10 mg by mouth daily.     Fluticasone-Salmeterol (ADVAIR DISKUS) 100-50 MCG/DOSE AEPB Inhale 1 puff into the lungs 2 (two) times daily. 60 each 3   acetaminophen (TYLENOL) 325 MG tablet Take 325 mg by mouth every 6 (six) hours as needed.     buprenorphine-naloxone (SUBOXONE) 8-2 mg SUBL SL tablet Place 1 tablet under the tongue 2 (two) times daily. (Patient not taking: Reported on 11/08/2019)     No facility-administered medications prior to visit.    Allergies  Allergen Reactions   Penicillins Rash    ROS Review of Systems  Constitutional: Negative.   HENT: Negative.    Eyes:  Positive for visual disturbance (states that his reading glasses was broken).  Respiratory:  Positive for cough, chest tightness and shortness of breath.   Cardiovascular: Negative.   Gastrointestinal: Negative.   Endocrine: Negative.   Genitourinary: Negative.   Musculoskeletal:  Positive for back pain.  Skin: Negative.   Neurological: Negative.   Psychiatric/Behavioral:  The patient is nervous/anxious.      Objective:    Physical Exam HENT:     Head: Normocephalic and atraumatic.     Nose: Nose normal.     Mouth/Throat:     Mouth: Mucous membranes are moist.  Eyes:     Extraocular Movements: Extraocular movements intact.     Conjunctiva/sclera: Conjunctivae normal.     Pupils: Pupils are equal, round, and reactive to light.  Cardiovascular:     Rate and Rhythm: Normal rate and regular rhythm.     Pulses: Normal pulses.     Heart sounds: Normal heart sounds.  Pulmonary:     Effort: Pulmonary  effort is normal.     Breath sounds: Examination of the right-upper field reveals wheezing. Examination of the left-upper field reveals wheezing. Wheezing present.  Abdominal:     General: Bowel sounds are normal.     Palpations: Abdomen is soft.  Genitourinary:    Comments: Deferred per patient Musculoskeletal:        General: Normal range of motion.     Cervical back: Normal range of motion.  Skin:  General: Skin is warm.  Neurological:     General: No focal deficit present.     Mental Status: He is alert and oriented to person, place, and time. Mental status is at baseline.  Psychiatric:        Mood and Affect: Mood normal.        Behavior: Behavior normal.        Thought Content: Thought content normal.        Judgment: Judgment normal.    BP 138/86 (BP Location: Left Arm, Patient Position: Sitting, Cuff Size: Large)    Pulse 81    Temp 97.6 F (36.4 C) (Oral)    Resp 17    Ht 5' 10"  (1.778 m)    Wt 259 lb 4.8 oz (117.6 kg)    SpO2 92%    BMI 37.21 kg/m  Wt Readings from Last 3 Encounters:  07/31/21 259 lb 4.8 oz (117.6 kg)  06/28/21 263 lb (119.3 kg)  10/14/19 263 lb 14.4 oz (119.7 kg)   Encouraged weight loss  Health Maintenance Due  Topic Date Due   TETANUS/TDAP  Never done   COVID-19 Vaccine (2 - Pfizer risk series) 10/16/2019   INFLUENZA VACCINE  Never done    There are no preventive care reminders to display for this patient.  Lab Results  Component Value Date   TSH 1.120 09/30/2019   Lab Results  Component Value Date   WBC 7.0 06/28/2021   HGB 15.1 06/28/2021   HCT 44.7 06/28/2021   MCV 93.5 06/28/2021   PLT 187 06/28/2021   Lab Results  Component Value Date   NA 144 09/30/2019   K 4.4 09/30/2019   CO2 26 09/30/2019   GLUCOSE 70 09/30/2019   BUN 7 09/30/2019   CREATININE 0.87 09/30/2019   BILITOT 0.3 06/28/2021   ALKPHOS 118 (H) 09/30/2019   AST 22 06/28/2021   ALT 31 06/28/2021   ALT 33 06/28/2021   PROT 6.4 06/28/2021   ALBUMIN 4.1  09/30/2019   CALCIUM 9.2 09/30/2019   ANIONGAP 8 01/16/2019   Lab Results  Component Value Date   CHOL 158 09/30/2019   Lab Results  Component Value Date   HDL 55 09/30/2019   Lab Results  Component Value Date   LDLCALC 75 09/30/2019   Lab Results  Component Value Date   TRIG 167 (H) 09/30/2019   Lab Results  Component Value Date   CHOLHDL 2.9 09/30/2019   Lab Results  Component Value Date   HGBA1C 5.1 09/30/2019      Assessment & Plan:     1. Smoking - He was educated on smoking cessation, provided with [place] Quitline information and encouraged to call.  2. Essential hypertension - His blood pressure is under control, he will continue on current medication, DASH diet and exercise as tolerated. - amLODipine (NORVASC) 10 MG tablet; Take 1 tablet (10 mg total) by mouth once daily.  Dispense: 90 tablet; Refill: 1  3. Chronic hepatitis C without hepatic coma ([place]) - He will continue current medication, encouraged to follow up at Infectious Disease clinic.  4. Generalized anxiety disorder - He will follow up with [facility], was advised to call the Crisis help line with worsening symptoms.  5. Encounter to establish care - Routine labs will be checked. - Comp Met (CMET); Future - UA/M w/rflx Culture, Routine; Future - Lipid panel; Future - HgB A1c; Future - Vitamin D (25 hydroxy); Future - Vitamin D (25 hydroxy) - HgB  A1c - Lipid panel - UA/M w/rflx Culture, Routine - Comp Met (CMET)  6. Blurry vision - He was advised to complete [facility] financial application for Ophthalmology referral.   7. Wheezing - He has mild expiratory wheezing, and will continue using his albuterol and Wixela. --- fluticasone-salmeterol (WIXELA INHUB) 100-50 MCG/ACT AEPB; Inhale 1 puff into the lungs 2 (two) times daily.  Dispense: 60 each; Refill: 2 - albuterol (PROVENTIL HFA) 108 (90 Base) MCG/ACT inhaler; Inhale 1-2 puffs into the lungs once every 6 (six) hours as needed for  wheezing or shortness of breath.  Dispense: 6.7 g; Refill: 3   Follow-up: Return in about 6 weeks (around 09/11/2021), or if symptoms worsen or fail to improve.    Caycee Wanat Jerold Coombe, NP

## 2021-07-31 NOTE — Patient Instructions (Addendum)
Smoking Tobacco Information, Adult Smoking tobacco can be harmful to your health. Tobacco contains a poisonous (toxic), colorless chemical called nicotine. Nicotine is addictive. It changes the brain and can make it hard to stop smoking. Tobacco also has other toxic chemicals that can hurt your body and raise your risk of many cancers. How can smoking tobacco affect me? Smoking tobacco puts you at risk for: Cancer. Smoking is most commonly associated with lung cancer, but can also lead to cancer in other parts of the body. Chronic obstructive pulmonary disease (COPD). This is a long-term lung condition that makes it hard to breathe. It also gets worse over time. High blood pressure (hypertension), heart disease, stroke, or heart attack. Lung infections, such as pneumonia. Cataracts. This is when the lenses in the eyes become clouded. Digestive problems. This may include peptic ulcers, heartburn, and gastroesophageal reflux disease (GERD). Oral health problems, such as gum disease and tooth loss. Loss of taste and smell. Smoking can affect your appearance by causing: Wrinkles. Yellow or stained teeth, fingers, and fingernails. Smoking tobacco can also affect your social life, because: It may be challenging to find places to smoke when away from home. Many workplaces, [employer], hotels, and public places are tobacco-free. Smoking is expensive. This is due to the cost of tobacco and the long-term costs of treating health problems from smoking. Secondhand smoke may affect those around you. Secondhand smoke can cause lung cancer, breathing problems, and heart disease. Children of smokers have a higher risk for: Sudden infant death syndrome (SIDS). Ear infections. Lung infections. If you currently smoke tobacco, quitting now can help you: Lead a longer and healthier life. Look, smell, breathe, and feel better over time. Save money. Protect others from the harms of secondhand smoke. What  actions can I take to prevent health problems? Quit smoking  Do not start smoking. Quit if you already do. Make a plan to quit smoking and commit to it. Look for programs to help you and ask your health care provider for recommendations and ideas. Set a date and write down all the reasons you want to quit. Let your friends and family know you are quitting so they can help and support you. Consider finding friends who also want to quit. It can be easier to quit with someone else, so that you can support each other. Talk with your health care provider about using nicotine replacement medicines to help you quit, such as gum, lozenges, patches, sprays, or pills. Do not replace cigarette smoking with electronic cigarettes, which are commonly called e-cigarettes. The safety of e-cigarettes is not known, and some may contain harmful chemicals. If you try to quit but return to smoking, stay positive. It is common to slip up when you first quit, so take it one day at a time. Be prepared for cravings. When you feel the urge to smoke, chew gum or suck on hard candy. Lifestyle Stay busy and take care of your body. Drink enough fluid to keep your urine pale yellow. Get plenty of exercise and eat a healthy diet. This can help prevent weight gain after quitting. Monitor your eating habits. Quitting smoking can cause you to have a larger appetite than when you smoke. Find ways to relax. Go out with friends or family to a movie or a restaurant where people do not smoke. Ask your health care provider about having regular tests (screenings) to check for cancer. This may include blood tests, imaging tests, and other tests. Find ways to manage  your stress, such as meditation, yoga, or exercise. °Where to find support °To get support to quit smoking, consider: °Asking your health care provider for more information and resources. °Taking classes to learn more about quitting smoking. °Looking for local organizations that  offer resources about quitting smoking. °Joining a support group for people who want to quit smoking in your local community. °Calling the smokefree.gov counselor helpline: 1-800-Quit-Now (1-800-784-8669) °Where to find more information °You may find more information about quitting smoking from: °HelpGuide.org: www.helpguide.org °Smokefree.gov: smokefree.gov °American Lung Association: www.lung.org °Contact a health care provider if you: °Have problems breathing. °Notice that your lips, nose, or fingers turn blue. °Have chest pain. °Are coughing up blood. °Feel faint or you pass out. °Have other health changes that cause you to worry. °Summary °Smoking tobacco can negatively affect your health, the health of those around you, your finances, and your social life. °Do not start smoking. Quit if you already do. If you need help quitting, ask your health care provider. °Think about joining a support group for people who want to quit smoking in your local community. There are many effective programs that will help you to quit this behavior. °This information is not intended to replace advice given to you by your health care provider. Make sure you discuss any questions you have with your health care provider. °Document Revised: 01/15/2021 Document Reviewed: 04/04/2020 °Elsevier Patient Education © 2022 Elsevier Inc. °DASH Eating Plan °DASH stands for Dietary Approaches to Stop Hypertension. The DASH eating plan is a healthy eating plan that has been shown to: °Reduce high blood pressure (hypertension). °Reduce your risk for type 2 diabetes, heart disease, and stroke. °Help with weight loss. °What are tips for following this plan? °Reading food labels °Check food labels for the amount of salt (sodium) per serving. Choose foods with less than 5 percent of the Daily Value of sodium. Generally, foods with less than 300 milligrams (mg) of sodium per serving fit into this eating plan. °To find whole grains, look for the word  "whole" as the first word in the ingredient list. °Shopping °Buy products labeled as "low-sodium" or "no salt added." °Buy fresh foods. Avoid canned foods and pre-made or frozen meals. °Cooking °Avoid adding salt when cooking. Use salt-free seasonings or herbs instead of table salt or sea salt. Check with your health care provider or pharmacist before using salt substitutes. °Do not fry foods. Cook foods using healthy methods such as baking, boiling, grilling, roasting, and broiling instead. °Cook with heart-healthy oils, such as olive, canola, avocado, soybean, or sunflower oil. °Meal planning ° °Eat a balanced diet that includes: °4 or more servings of fruits and 4 or more servings of vegetables each day. Try to fill one-half of your plate with fruits and vegetables. °6-8 servings of whole grains each day. °Less than 6 oz (170 g) of lean meat, poultry, or fish each day. A 3-oz (85-g) serving of meat is about the same size as a deck of cards. One egg equals 1 oz (28 g). °2-3 servings of low-fat dairy each day. One serving is 1 cup (237 mL). °1 serving of nuts, seeds, or beans 5 times each week. °2-3 servings of heart-healthy fats. Healthy fats called omega-3 fatty acids are found in foods such as walnuts, flaxseeds, fortified milks, and eggs. These fats are also found in cold-water fish, such as sardines, salmon, and mackerel. °Limit how much you eat of: °Canned or prepackaged foods. °Food that is high in trans fat, such as   some fried foods. Food that is high in saturated fat, such as fatty meat. Desserts and other sweets, sugary drinks, and other foods with added sugar. Full-fat dairy products. Do not salt foods before eating. Do not eat more than 4 egg yolks a week. Try to eat at least 2 vegetarian meals a week. Eat more home-cooked food and less restaurant, buffet, and fast food. Lifestyle When eating at a restaurant, ask that your food be prepared with less salt or no salt, if possible. If you drink  alcohol: Limit how much you use to: 0-1 drink a day for women who are not pregnant. 0-2 drinks a day for men. Be aware of how much alcohol is in your drink. In the [place] one drink equals one 12 oz bottle of beer (355 mL), one 5 oz glass of wine (148 mL), or one 1 oz glass of hard liquor (44 mL). General information Avoid eating more than 2,300 mg of salt a day. If you have hypertension, you may need to reduce your sodium intake to 1,500 mg a day. Work with your health care provider to maintain a healthy body weight or to lose weight. Ask what an ideal weight is for you. Get at least 30 minutes of exercise that causes your heart to beat faster (aerobic exercise) most days of the week. Activities may include walking, swimming, or biking. Work with your health care provider or dietitian to adjust your eating plan to your individual calorie needs. What foods should I eat? Fruits All fresh, dried, or frozen fruit. Canned fruit in natural juice (without added sugar). Vegetables Fresh or frozen vegetables (raw, steamed, roasted, or grilled). Low-sodium or reduced-sodium tomato and vegetable juice. Low-sodium or reduced-sodium tomato sauce and tomato paste. Low-sodium or reduced-sodium canned vegetables. Grains Whole-grain or whole-wheat bread. Whole-grain or whole-wheat pasta. Brown rice. [place]. Bulgur. Whole-grain and low-sodium cereals. Pita bread. Low-fat, low-sodium crackers. Whole-wheat flour tortillas. Meats and other proteins Skinless chicken or [place]. Ground chicken or [place]. Pork with fat trimmed off. Fish and seafood. Egg whites. Dried beans, peas, or lentils. Unsalted nuts, nut butters, and seeds. Unsalted canned beans. Lean cuts of beef with fat trimmed off. Low-sodium, lean precooked or cured meat, such as sausages or meat loaves. Dairy Low-fat (1%) or fat-free (skim) milk. Reduced-fat, low-fat, or fat-free cheeses. Nonfat, low-sodium ricotta or cottage cheese. Low-fat or  nonfat yogurt. Low-fat, low-sodium cheese. Fats and oils Soft margarine without trans fats. Vegetable oil. Reduced-fat, low-fat, or light mayonnaise and salad dressings (reduced-sodium). Canola, safflower, olive, avocado, soybean, and sunflower oils. Avocado. Seasonings and condiments Herbs. Spices. Seasoning mixes without salt. Other foods Unsalted popcorn and pretzels. Fat-free sweets. The items listed above may not be a complete list of foods and beverages you can eat. Contact a dietitian for more information. What foods should I avoid? Fruits Canned fruit in a light or heavy syrup. Fried fruit. Fruit in cream or butter sauce. Vegetables Creamed or fried vegetables. Vegetables in a cheese sauce. Regular canned vegetables (not low-sodium or reduced-sodium). Regular canned tomato sauce and paste (not low-sodium or reduced-sodium). Regular tomato and vegetable juice (not low-sodium or reduced-sodium). Angie Fava. Olives. Grains Baked goods made with fat, such as croissants, muffins, or some breads. Dry pasta or rice meal packs. Meats and other proteins Fatty cuts of meat. Ribs. Fried meat. Berniece Salines. Bologna, salami, and other precooked or cured meats, such as sausages or meat loaves. Fat from the back of a pig (fatback). Bratwurst. Salted nuts and seeds. Canned beans  with added salt. Canned or smoked fish. Whole eggs or egg yolks. Chicken or turkey with skin. °Dairy °Whole or 2% milk, cream, and half-and-half. Whole or full-fat cream cheese. Whole-fat or sweetened yogurt. Full-fat cheese. Nondairy creamers. Whipped toppings. Processed cheese and cheese spreads. °Fats and oils °Butter. Stick margarine. Lard. Shortening. Ghee. Bacon fat. Tropical oils, such as coconut, palm kernel, or palm oil. °Seasonings and condiments °Onion salt, garlic salt, seasoned salt, table salt, and sea salt. Worcestershire sauce. Tartar sauce. Barbecue sauce. Teriyaki sauce. Soy sauce, including reduced-sodium. Steak sauce.  Canned and packaged gravies. Fish sauce. Oyster sauce. Cocktail sauce. Store-bought horseradish. Ketchup. Mustard. Meat flavorings and tenderizers. Bouillon cubes. Hot sauces. Pre-made or packaged marinades. Pre-made or packaged taco seasonings. Relishes. Regular salad dressings. °Other foods °Salted popcorn and pretzels. °The items listed above may not be a complete list of foods and beverages you should avoid. Contact a dietitian for more information. °Where to find more information °National Heart, Lung, and Blood Institute: www.nhlbi.nih.gov °American Heart Association: www.heart.org °Academy of Nutrition and Dietetics: www.eatright.org °National Kidney Foundation: www.kidney.org °Summary °The DASH eating plan is a healthy eating plan that has been shown to reduce high blood pressure (hypertension). It may also reduce your risk for type 2 diabetes, heart disease, and stroke. °When on the DASH eating plan, aim to eat more fresh fruits and vegetables, whole grains, lean proteins, low-fat dairy, and heart-healthy fats. °With the DASH eating plan, you should limit salt (sodium) intake to 2,300 mg a day. If you have hypertension, you may need to reduce your sodium intake to 1,500 mg a day. °Work with your health care provider or dietitian to adjust your eating plan to your individual calorie needs. °This information is not intended to replace advice given to you by your health care provider. Make sure you discuss any questions you have with your health care provider. °Document Revised: 04/16/2019 Document Reviewed: 04/16/2019 °Elsevier Patient Education © 2022 Elsevier Inc. ° °

## 2021-08-01 ENCOUNTER — Other Ambulatory Visit: Payer: Self-pay

## 2021-08-01 LAB — COMPREHENSIVE METABOLIC PANEL
ALT: 23 IU/L (ref 0–44)
AST: 13 IU/L (ref 0–40)
Albumin/Globulin Ratio: 1.8 (ref 1.2–2.2)
Albumin: 4.4 g/dL (ref 4.0–5.0)
Alkaline Phosphatase: 111 IU/L (ref 44–121)
BUN/Creatinine Ratio: 14 (ref 9–20)
BUN: 13 mg/dL (ref 6–24)
Bilirubin Total: 0.4 mg/dL (ref 0.0–1.2)
CO2: 26 mmol/L (ref 20–29)
Calcium: 9.5 mg/dL (ref 8.7–10.2)
Chloride: 100 mmol/L (ref 96–106)
Creatinine, Ser: 0.95 mg/dL (ref 0.76–1.27)
Globulin, Total: 2.5 g/dL (ref 1.5–4.5)
Glucose: 93 mg/dL (ref 70–99)
Potassium: 4.6 mmol/L (ref 3.5–5.2)
Sodium: 138 mmol/L (ref 134–144)
Total Protein: 6.9 g/dL (ref 6.0–8.5)
eGFR: 104 mL/min/{1.73_m2} (ref >59)

## 2021-08-01 LAB — MICROSCOPIC EXAMINATION
Bacteria, UA: NONE SEEN
Casts: NONE SEEN /lpf
Epithelial Cells (non renal): NONE SEEN /hpf (ref 0–10)
RBC, Urine: NONE SEEN /hpf (ref 0–2)
WBC, UA: NONE SEEN /hpf (ref 0–5)

## 2021-08-01 LAB — LIPID PANEL
Chol/HDL Ratio: 2.2 ratio (ref 0.0–5.0)
Cholesterol, Total: 171 mg/dL (ref 100–199)
HDL: 77 mg/dL (ref >39)
LDL Chol Calc (NIH): 70 mg/dL (ref 0–99)
Triglycerides: 139 mg/dL (ref 0–149)
VLDL Cholesterol Cal: 24 mg/dL (ref 5–40)

## 2021-08-01 LAB — UA/M W/RFLX CULTURE, ROUTINE
Bilirubin, UA: NEGATIVE
Glucose, UA: NEGATIVE
Ketones, UA: NEGATIVE
Leukocytes,UA: NEGATIVE
Nitrite, UA: NEGATIVE
Protein,UA: NEGATIVE
RBC, UA: NEGATIVE
Specific Gravity, UA: 1.007 (ref 1.005–1.030)
Urobilinogen, Ur: 0.2 mg/dL (ref 0.2–1.0)
pH, UA: 7 (ref 5.0–7.5)

## 2021-08-01 LAB — HEMOGLOBIN A1C
Est. average glucose Bld gHb Est-mCnc: 91 mg/dL
Hgb A1c MFr Bld: 4.8 % (ref 4.8–5.6)

## 2021-08-01 LAB — VITAMIN D 25 HYDROXY (VIT D DEFICIENCY, FRACTURES): Vit D, 25-Hydroxy: 45 ng/mL (ref 30.0–100.0)

## 2021-08-02 DIAGNOSIS — R062 Wheezing: Secondary | ICD-10-CM | POA: Insufficient documentation

## 2021-08-03 ENCOUNTER — Other Ambulatory Visit: Payer: Self-pay

## 2021-08-06 ENCOUNTER — Other Ambulatory Visit: Payer: Self-pay

## 2021-08-06 ENCOUNTER — Ambulatory Visit: Payer: Self-pay | Admitting: Pharmacy Technician

## 2021-08-06 DIAGNOSIS — Z79899 Other long term (current) drug therapy: Secondary | ICD-10-CM

## 2021-08-06 NOTE — Progress Notes (Signed)
Completed [facility] application and contract.  Patient agreed to all terms of the [facility] contract.   ° °Patient approved to receive medication assistance at [facility] until time for re-certification in 2024, and as long as eligibility criteria continues to be met.   ° °Provided patient with community resource material based on his particular needs.   ° °Shelbee Apgar J. Jasyn Mey °Care Manager °[facility] °

## 2021-08-08 ENCOUNTER — Telehealth: Payer: Self-pay

## 2021-08-08 ENCOUNTER — Institutional Professional Consult (permissible substitution): Payer: Medicaid Other | Admitting: Licensed Clinical Social Worker

## 2021-08-08 ENCOUNTER — Ambulatory Visit: Payer: Self-pay | Admitting: Licensed Clinical Social Worker

## 2021-08-08 ENCOUNTER — Ambulatory Visit: Payer: Medicaid Other | Admitting: Licensed Clinical Social Worker

## 2021-08-08 ENCOUNTER — Other Ambulatory Visit: Payer: Self-pay

## 2021-08-08 DIAGNOSIS — F411 Generalized anxiety disorder: Secondary | ICD-10-CM

## 2021-08-08 NOTE — Telephone Encounter (Signed)
[facility] Patient Advocate Encounter ? ?Patient's medication (Mavyret) have been couriered to [facility] from [facility] and will be picked up 08/23/21. ? ?Clearance Coots , CPhT ?Specialty Pharmacy Patient Advocate ?[facility] ?Phone: [phone] ?Fax:  [phone]  ?
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
## 2021-08-14 ENCOUNTER — Other Ambulatory Visit: Payer: Self-pay

## 2021-08-14 ENCOUNTER — Encounter: Payer: Self-pay | Admitting: Gerontology

## 2021-08-14 ENCOUNTER — Ambulatory Visit: Payer: Medicaid Other | Admitting: Gerontology

## 2021-08-14 VITALS — BP 135/85 | HR 89 | Temp 97.5°F | Resp 18 | Ht 72.0 in | Wt 260.1 lb

## 2021-08-14 DIAGNOSIS — R0602 Shortness of breath: Secondary | ICD-10-CM

## 2021-08-14 DIAGNOSIS — G8929 Other chronic pain: Secondary | ICD-10-CM

## 2021-08-14 DIAGNOSIS — Z8719 Personal history of other diseases of the digestive system: Secondary | ICD-10-CM

## 2021-08-14 MED ORDER — OMEPRAZOLE 20 MG PO CPDR
20.0000 mg | DELAYED_RELEASE_CAPSULE | Freq: Every day | ORAL | 2 refills | Status: DC
  Filled 2021-08-14: qty 30, 30d supply, fill #0
  Filled 2021-09-11: qty 30, 30d supply, fill #1
  Filled 2021-10-09: qty 30, 30d supply, fill #2
  Filled 2021-11-08: qty 30, 30d supply, fill #0
  Filled 2021-11-08: qty 30, 30d supply, fill #2

## 2021-08-14 MED ORDER — FLUTICASONE FUROATE-VILANTEROL 200-25 MCG/ACT IN AEPB
1.0000 | INHALATION_SPRAY | Freq: Every day | RESPIRATORY_TRACT | 4 refills | Status: DC
  Filled 2021-08-14: qty 60, 30d supply, fill #0
  Filled 2021-09-11: qty 180, 90d supply, fill #1
  Filled 2021-12-02: qty 60, 30d supply, fill #0

## 2021-08-14 MED ORDER — MELOXICAM 7.5 MG PO TABS
7.5000 mg | ORAL_TABLET | Freq: Every day | ORAL | 0 refills | Status: DC
  Filled 2021-08-14: qty 30, 30d supply, fill #0

## 2021-08-14 NOTE — Progress Notes (Signed)
? ?Established Patient Office Visit ? ?Subjective:  ?Patient ID: Nathan Vasquez, male    DOB: [birth year]  Age: 41 y.o. MRN: [id number] ? ?CC:  ?Chief Complaint  ?Patient presents with  ? Medication Refill  ?  Meloxicam and Omeprazole  ? ? ?HPI ?Nathan Vasquez Vasquez is a 41 y/o male, who has history of Asthma, COPD, Hepatitis C, Heroin use , and History of balanitis presents for medication refill. He c/o of  worsening shortness of breath with walking one block and uses his rescue inhaler more than 4 times during the day. He also reports  intermittent chest tightness, wheezing and productive cough with minimal amount brownish colored phelgm. He states that he's cutting back of cigarette smoking, though smokes 1/2 pack in 2 days. He reports that using Wixela 100/50 does not relieve his symptoms.  He also c/o acid reflux and taking omeprazole relieves his symptoms. He c/o  traumatic non radiating chronic pain to right knee that has being going on for many months, states that he hyperextends his right leg when he overdosed on heroine  and it has been hurting since then. He states that walking aggravates pain and resting relieves symptoms. Overall, he states that he's concerned about his shortness of breath and offers no further complaint. ?  ? ?Past Medical History:  ?Diagnosis Date  ? Asthma   ? COPD (chronic obstructive pulmonary disease) ([place])   ? Hep C w/o coma, chronic (HCC)   ? Heroin abuse (Nathan Vasquez)   ? History of balanitis   ? Hypertension   ? ? ?Past Surgical History:  ?Procedure Laterality Date  ? TOOTH EXTRACTION    ? ? ?Family History  ?Problem Relation Age of Onset  ? COPD Mother   ? Diabetes Mother   ? Heart disease Mother   ? Anxiety disorder Mother   ? Stroke Father   ? Schizophrenia Father   ? Diabetes Maternal Grandmother   ? Diabetes Maternal Grandfather   ? Cancer Maternal Grandfather   ? Diabetes Paternal Grandmother   ? Diabetes Paternal Grandfather   ? Heart attack Paternal Grandfather    ? ? ?Social History  ? ?Socioeconomic History  ? Marital status: Soil scientist  ?  Spouse name: Not on file  ? Number of children: 1  ? Years of education: 6th grade  ? Highest education level: 6th grade  ?Occupational History  ? Occupation: NA  ?Tobacco Use  ? Smoking status: Every Day  ?  Packs/day: 0.50  ?  Types: Cigarettes  ? Smokeless tobacco: Never  ?Vaping Use  ? Vaping Use: Former  ? Quit date: 12/21/2019  ?Substance and Sexual Activity  ? Alcohol use: Not Currently  ?  Alcohol/week: 1.0 standard drink  ?  Types: 1 Cans of beer per week  ?  Comment: last use 12/21/19  ? Drug use: Not Currently  ?  Frequency: 7.0 times per week  ?  Types: Heroin, Marijuana  ?  Comment: last use 12/21/19  heroin.  this morning, trying to quit but finds it difficult with pain/sickness  ? Sexual activity: Yes  ?  Partners: Female  ?Other Topics Concern  ? Not on file  ?Social History Narrative  ? Not on file  ? ?Social Determinants of Health  ? ?Financial Resource Strain: Not on file  ?Food Insecurity: Food Insecurity Present  ? Worried About Charity fundraiser in the Last Year: Sometimes true  ? Ran Out of Food in the Last  Year: Sometimes true  ?Transportation Needs: No Transportation Needs  ? Lack of Transportation (Medical): No  ? Lack of Transportation (Non-Medical): No  ?Physical Activity: Not on file  ?Stress: Not on file  ?Social Connections: Not on file  ?Intimate Partner Violence: Not on file  ? ? ?Outpatient Medications Prior to Visit  ?Medication Sig Dispense Refill  ? albuterol (PROVENTIL HFA) 108 (90 Base) MCG/ACT inhaler Inhale 1-2 puffs into the lungs once every 6 (six) hours as needed for wheezing or shortness of breath. 6.7 g 3  ? amLODipine (NORVASC) 10 MG tablet Take 1 tablet (10 mg total) by mouth once daily. 90 tablet 1  ? Glecaprevir-Pibrentasvir (MAVYRET) 100-40 MG TABS Take 3 tablets by mouth daily.    ? fluticasone-salmeterol (WIXELA INHUB) 100-50 MCG/ACT AEPB Inhale 1 puff into the lungs 2 (two)  times daily. 60 each 2  ? meloxicam (MOBIC) 7.5 MG tablet Take 1 tablet by mouth 2 (two) times daily. (Patient not taking: Reported on 08/14/2021)    ? omeprazole (PRILOSEC) 20 MG capsule Take 1 capsule (20 mg total) by mouth daily. (Patient not taking: Reported on 08/14/2021) 90 capsule 0  ? ?No facility-administered medications prior to visit.  ? ? ?Allergies  ?Allergen Reactions  ? Penicillins Rash  ? ? ?ROS ?Review of Systems  ?Constitutional: Negative.   ?Respiratory:  Positive for cough, chest tightness, shortness of breath and wheezing.   ?Cardiovascular: Negative.   ?Gastrointestinal: Negative.   ?Musculoskeletal:  Positive for arthralgias (right knee pain).  ?Neurological: Negative.   ? ?  ?Objective:  ?  ?Physical Exam ?Cardiovascular:  ?   Rate and Rhythm: Normal rate and regular rhythm.  ?   Pulses: Normal pulses.  ?   Heart sounds: Normal heart sounds.  ?Pulmonary:  ?   Breath sounds: Examination of the right-middle field reveals wheezing. Examination of the left-middle field reveals wheezing. Examination of the right-lower field reveals decreased breath sounds. Examination of the left-lower field reveals decreased breath sounds. Decreased breath sounds and wheezing present.  ?Abdominal:  ?   General: Bowel sounds are normal.  ?   Palpations: Abdomen is soft.  ?Musculoskeletal:     ?   General: Tenderness (palpation to right knee) present.  ?Neurological:  ?   General: No focal deficit present.  ?   Mental Status: He is alert and oriented to person, place, and time. Mental status is at baseline.  ? ? ?BP 135/85 (BP Location: Right Arm, Patient Position: Sitting, Cuff Size: Large)   Pulse 89   Temp (!) 97.5 ?F (36.4 ?C) (Oral)   Resp 18   Ht 6' (1.829 m)   Wt 260 lb 1.6 oz (118 kg)   SpO2 93%   BMI 35.28 kg/m?  ?Wt Readings from Last 3 Encounters:  ?08/14/21 260 lb 1.6 oz (118 kg)  ?07/31/21 259 lb 4.8 oz (117.6 kg)  ?06/28/21 263 lb (119.3 kg)  ? ?Encouraged weight loss ? ?Health Maintenance Due   ?Topic Date Due  ? TETANUS/TDAP  Never done  ? COVID-19 Vaccine (2 - Pfizer risk series) 10/16/2019  ? INFLUENZA VACCINE  Never done  ? ? ?There are no preventive care reminders to display for this patient. ? ?Lab Results  ?Component Value Date  ? TSH 1.120 09/30/2019  ? ?Lab Results  ?Component Value Date  ? WBC 7.0 06/28/2021  ? HGB 15.1 06/28/2021  ? HCT 44.7 06/28/2021  ? MCV 93.5 06/28/2021  ? PLT 187 06/28/2021  ? ?Lab Results  ?  Component Value Date  ? NA 138 07/31/2021  ? K 4.6 07/31/2021  ? CO2 26 07/31/2021  ? GLUCOSE 93 07/31/2021  ? BUN 13 07/31/2021  ? CREATININE 0.95 07/31/2021  ? BILITOT 0.4 07/31/2021  ? ALKPHOS 111 07/31/2021  ? AST 13 07/31/2021  ? ALT 23 07/31/2021  ? PROT 6.9 07/31/2021  ? ALBUMIN 4.4 07/31/2021  ? CALCIUM 9.5 07/31/2021  ? ANIONGAP 8 01/16/2019  ? EGFR 104 07/31/2021  ? ?Lab Results  ?Component Value Date  ? CHOL 171 07/31/2021  ? ?Lab Results  ?Component Value Date  ? HDL 77 07/31/2021  ? ?Lab Results  ?Component Value Date  ? Shiner 70 07/31/2021  ? ?Lab Results  ?Component Value Date  ? TRIG 139 07/31/2021  ? ?Lab Results  ?Component Value Date  ? CHOLHDL 2.2 07/31/2021  ? ?Lab Results  ?Component Value Date  ? HGBA1C 4.8 07/31/2021  ? ? ?  ?Assessment & Plan:  ? ?1. Shortness of breath ?- His shortness of breath might be likely due to Asthma or COPD flare, he reports that he can't take Prednisone because of been on Mavyret for Hep C treatment. He was encouraged to have chest X ray done tomorrow and to go to the ED for worsening symptoms. His Wixela was changed to [facility], was educated on medication side effects and advised to notify clinic. He was educated on smoking cessation. ?- DG Chest 2 View; Future ?- fluticasone furoate-vilanterol (BREO ELLIPTA) 200-25 MCG/ACT AEPB; Inhale 1 puff into the lungs daily.  Dispense: 60 each; Refill: 4 ? ?2. History of gastroesophageal reflux (GERD) ?- His acid reflux is under control with taking Omeprazole,  ?-Avoid spicy, fatty and fried  food ?-Avoid sodas and sour juices ?-Avoid heavy meals ?-Avoid eating 4 hours before bedtime ?-Elevate head of bed at night ?- omeprazole (PRILOSEC) 20 MG capsule; Take 1 capsule (20 mg total) by mouth daily.  Dispense:

## 2021-08-15 ENCOUNTER — Other Ambulatory Visit: Payer: Self-pay

## 2021-08-15 ENCOUNTER — Telehealth: Payer: Self-pay | Admitting: Pharmacist

## 2021-08-15 ENCOUNTER — Ambulatory Visit
Admission: RE | Admit: 2021-08-15 | Discharge: 2021-08-15 | Disposition: A | Discharge status: Home / Self Care | Payer: Medicaid Other | Attending: Gerontology | Admitting: Gerontology

## 2021-08-15 ENCOUNTER — Ambulatory Visit
Admission: RE | Admit: 2021-08-15 | Discharge: 2021-08-15 | Disposition: A | Discharge status: Home / Self Care | Payer: Medicaid Other | Source: Ambulatory Visit | Attending: Gerontology | Admitting: Gerontology

## 2021-08-15 DIAGNOSIS — R0602 Shortness of breath: Secondary | ICD-10-CM

## 2021-08-15 NOTE — Telephone Encounter (Signed)
08/15/2021 4:15:58 PM - Virgel Bouquet Ellipta to dr & pt ?-- Tarry Kos - Wednesday, August 15, 2021 4:14 PM --Received printout from pharmacy for [facility] [id number]. Mailed forms to pt for signature. Provider portion in [facility] folder on my desk. ?

## 2021-08-16 ENCOUNTER — Other Ambulatory Visit: Payer: Self-pay

## 2021-08-16 ENCOUNTER — Other Ambulatory Visit: Payer: Self-pay | Admitting: Gerontology

## 2021-08-16 ENCOUNTER — Ambulatory Visit: Payer: Self-pay | Admitting: Licensed Clinical Social Worker

## 2021-08-16 ENCOUNTER — Ambulatory Visit: Payer: Medicaid Other | Admitting: Licensed Clinical Social Worker

## 2021-08-16 DIAGNOSIS — Z8659 Personal history of other mental and behavioral disorders: Secondary | ICD-10-CM

## 2021-08-16 DIAGNOSIS — J441 Chronic obstructive pulmonary disease with (acute) exacerbation: Secondary | ICD-10-CM

## 2021-08-16 MED ORDER — DOXYCYCLINE HYCLATE 100 MG PO CAPS
100.0000 mg | ORAL_CAPSULE | Freq: Two times a day (BID) | ORAL | 0 refills | Status: DC
  Filled 2021-08-16: qty 10, 5d supply, fill #0

## 2021-08-16 NOTE — BH Specialist Note (Signed)
Integrated Behavioral Health via Telemedicine Visit ? ?08/16/2021 ?CLEMONS SALVUCCI II ?[id number] ? ?Number of [place] Clinician visits: No data recorded ?Session Start time: No data recorded  ?Session End time: No data recorded ?Total time in minutes: No data recorded ? ?Referring Provider: Carlyon Shadow, NP  ?Patient/Family location: The patient's home ?[facility] Provider location: [facility] [place] ?All persons participating in visit: JEOVANI WEISENBURGER II and Jerrilyn Cairo, LCSW-A ?Types of Service: Telephone visit ? ?I connected with Orson Slick II via Telephone or Video Enabled Telemedicine Application  (Video is Caregility application) and verified that I am speaking with the correct person using two identifiers. Discussed confidentiality: Yes  ? ?I discussed the limitations of telemedicine and the availability of in person appointments.  Discussed there is a possibility of technology failure and discussed alternative modes of communication if that failure occurs. ? ?Patient and/or legal guardian expressed understanding and consented to Telemedicine visit: Yes  ? ?Presenting Concerns: ?Patient and/or family reports the following symptoms/concerns: The patient reports that he has been doing the same since his last follow-up appointment. He noted that his week has been okay and nothing new has occurred. He stated that his mood has been the same and he still struggles to sleep. The patient reports that he has called around to various clinics and no one will prescribe Suboxone unless he is dirty. He explained that he is upset because he has been out of prison for two months and has not found any help. 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 He stated that [facility] requires him to be dirty to get accepted into a program, and he does not have reliable  transportation. The patient denied any suicidal or homicidal thoughts.  ?Duration of problem: Years; Severity of problem: moderate ? ?Patient and/or Family's Strengths/Protective Factors: ?Concrete supports in place (healthy food, safe environments, etc.) ? ?Goals Addressed: ?Patient will: ? Reduce symptoms of: agitation, anxiety, depression, insomnia, and stress  ? Increase knowledge and/or ability of: coping skills, healthy habits, self-management skills, and stress reduction  ? Demonstrate ability to: Increase healthy adjustment to current life circumstances, Increase adequate support systems for patient/family, and Decrease self-medicating behaviors ? ?Progress towards Goals: ?Other ? ?Interventions: ?Interventions utilized:  Supportive Counselingwas utilized by the clinician during today's follow up session. Clinician met with patient to identify needs related to stressors and functioning, and assess and monitor for signs and symptoms of anxiety and depression, and assess safety. The clinician processed with the patient how they have been doing since the last follow-up session. Clinician explained that the psychiatric consultant recommendations were for the patient to be referred to [facility] for treatment as his case requires a higher level of care. Clinician reassured the patient that he did not need to actively use drugs to be eligible to establish care. Clinician offered to e-mail the patient [facility]'s contact information as well as other mental health providers in the area including those that treat substance abuse disorders.  ?Standardized Assessments completed: GAD-7 and PHQ 9 ?GAD-7= 11 ?PHQ-9= 19 ? ?Patient and/or Family Response: The patient stated that he can not go back to [place] because he has too much on his plate to complete; CB-I classes three times per week in [place], and [facility] requires him to be dirty to get accepted into a program, and he  does not have reliable transportation.  ? ?Assessment: ?Patient  currently experiencing see above.  ? ?Patient may benefit from see above. ? ?Plan: ?Follow up with behavioral health clinician on : Establish care with [facility] ?Behavioral recommendations:  ?Referral(s): [place] (LME/Outside Clinic) Referred to [facility] ? ?I discussed the assessment and treatment plan with the patient and/or parent/guardian. They were provided an opportunity to ask questions and all were answered. They agreed with the plan and demonstrated an understanding of the instructions. ?  ?They were advised to call back or seek an in-person evaluation if the symptoms worsen or if the condition fails to improve as anticipated. ? ?Lesli Albee, LCSWA ?

## 2021-08-22 ENCOUNTER — Telehealth: Payer: Self-pay | Admitting: Pharmacist

## 2021-08-22 NOTE — Telephone Encounter (Signed)
08/22/2021 2:12:12 PM - Virgel Bouquet Ellipta pending ?-- Tarry Kos - Wednesday, August 22, 2021 2:10 PM -- ? ?Received provider signed portion for [facility]. Holding for pt signed portion. ?

## 2021-08-23 ENCOUNTER — Other Ambulatory Visit: Payer: Self-pay

## 2021-08-23 ENCOUNTER — Ambulatory Visit (INDEPENDENT_AMBULATORY_CARE_PROVIDER_SITE_OTHER): Payer: Self-pay | Admitting: Pharmacist

## 2021-08-23 DIAGNOSIS — Z23 Encounter for immunization: Secondary | ICD-10-CM

## 2021-08-23 DIAGNOSIS — B182 Chronic viral hepatitis C: Secondary | ICD-10-CM

## 2021-08-23 NOTE — Progress Notes (Signed)
? ?08/23/2021 ? ?HPI: Nathan Vasquez is a 41 y.o. male who presents to the [facility] pharmacy clinic for Hepatitis C follow-up. ? ?Medication: Mavyret ? ?Start Date: 07/26/2021 ? ?Hepatitis C Genotype: 1a ? ?Fibrosis Score: F0 ? ?Hepatitis C RNA: 2.35 million ? ?Patient Active Problem List  ? Diagnosis Date Noted  ? Shortness of breath 08/14/2021  ? History of gastroesophageal reflux (GERD) 08/14/2021  ? Chronic pain of right knee 08/14/2021  ? Wheezing 08/02/2021  ? Essential hypertension 07/31/2021  ? Encounter to establish care 07/31/2021  ? Blurry vision 07/31/2021  ? Chronic hepatitis C without hepatic coma (HCC) 06/28/2021  ? Asthma 10/28/2019  ? Left knee pain 10/28/2019  ? Smoking 10/28/2019  ? Generalized anxiety disorder 09/28/2019  ? Complaint of debility and malaise 08/04/2016  ? Mild intermittent asthma 08/04/2016  ? Opiate overdose (HCC) 08/04/2016  ? ? ?Patient's Medications  ?New Prescriptions  ? No medications on file  ?Previous Medications  ? ALBUTEROL (PROVENTIL HFA) 108 (90 BASE) MCG/ACT INHALER    Inhale 1-2 puffs into the lungs once every 6 (six) hours as needed for wheezing or shortness of breath.  ? AMLODIPINE (NORVASC) 10 MG TABLET    Take 1 tablet (10 mg total) by mouth once daily.  ? DOXYCYCLINE (VIBRAMYCIN) 100 MG CAPSULE    Take 1 capsule (100 mg total) by mouth 2 (two) times daily.  ? FLUTICASONE FUROATE-VILANTEROL (BREO ELLIPTA) 200-25 MCG/ACT AEPB    Inhale 1 puff into the lungs once daily.  ? GLECAPREVIR-PIBRENTASVIR (MAVYRET) 100-40 MG TABS    Take 3 tablets by mouth daily.  ? MELOXICAM (MOBIC) 7.5 MG TABLET    Take 1 tablet (7.5 mg total) by mouth once daily.  ? OMEPRAZOLE (PRILOSEC) 20 MG CAPSULE    Take 1 capsule (20 mg total) by mouth once daily.  ?Modified Medications  ? No medications on file  ?Discontinued Medications  ? No medications on file  ? ? ?Allergies: ?Allergies  ?Allergen Reactions  ? Penicillins Rash  ? ? ?Past Medical History: ?Past Medical History:  ?Diagnosis  Date  ? Asthma   ? COPD (chronic obstructive pulmonary disease) (HCC)   ? Hep C w/o coma, chronic (HCC)   ? Heroin abuse (HCC)   ? History of balanitis   ? Hypertension   ? ? ?Social History: ?Social History  ? ?Socioeconomic History  ? Marital status: Media planner  ?  Spouse name: Not on file  ? Number of children: 1  ? Years of education: 6th grade  ? Highest education level: 6th grade  ?Occupational History  ? Occupation: NA  ?Tobacco Use  ? Smoking status: Every Day  ?  Packs/day: 0.50  ?  Types: Cigarettes  ? Smokeless tobacco: Never  ?Vaping Use  ? Vaping Use: Former  ? Quit date: 12/21/2019  ?Substance and Sexual Activity  ? Alcohol use: Not Currently  ?  Alcohol/week: 1.0 standard drink  ?  Types: 1 Cans of beer per week  ?  Comment: last use 12/21/19  ? Drug use: Not Currently  ?  Frequency: 7.0 times per week  ?  Types: Heroin, Marijuana  ?  Comment: last use 12/21/19  heroin.  this morning, trying to quit but finds it difficult with pain/sickness  ? Sexual activity: Yes  ?  Partners: Female  ?Other Topics Concern  ? Not on file  ?Social History Narrative  ? Not on file  ? ?Social Determinants of Health  ? ?Physicist, medical  Strain: Not on file  ?Food Insecurity: Food Insecurity Present  ? Worried About Programme researcher, broadcasting/film/video in the Last Year: Sometimes true  ? Ran Out of Food in the Last Year: Sometimes true  ?Transportation Needs: No Transportation Needs  ? Lack of Transportation (Medical): No  ? Lack of Transportation (Non-Medical): No  ?Physical Activity: Not on file  ?Stress: Not on file  ?Social Connections: Not on file  ? ? ?Labs: ?Hepatitis C ?Lab Results  ?Component Value Date  ? HCVGENOTYPE 1a 06/28/2021  ? HCVRNAPCRQN 2,350,000 (H) 06/28/2021  ? FIBROSTAGE F0 06/28/2021  ? ?Hepatitis B ?Lab Results  ?Component Value Date  ? HEPBSAG NON-REACTIVE 06/28/2021  ? ?Hepatitis A ?No results found for: HAV ?HIV ?Lab Results  ?Component Value Date  ? HIV NON-REACTIVE 06/28/2021  ? HIV NON REACTIVE  06/12/2017  ? ?Lab Results  ?Component Value Date  ? CREATININE 0.95 07/31/2021  ? CREATININE 0.87 09/30/2019  ? CREATININE 0.87 01/16/2019  ? CREATININE 0.84 09/16/2017  ? CREATININE 0.89 09/13/2017  ? ?Lab Results  ?Component Value Date  ? AST 13 07/31/2021  ? AST 22 06/28/2021  ? AST 18 09/30/2019  ? ALT 23 07/31/2021  ? ALT 31 06/28/2021  ? ALT 33 06/28/2021  ? INR 0.9 06/28/2021  ? ? ?Assessment: ?[name] presents to clinic today for HepC follow-up. He reports he has been doing well. He has been taking Mavyret as prescribed, 3 tabs with lunch every day. He does endorse having some nausea. Recommended he take Mavyret with dinner so he can sleep through the nausea. Medication was provided to patient today. Will get HCV RNA.  ? ?[name] reports missing 1 dose about 1 week ago due to feeling sick. Otherwise, he has been fully adherent. His energy has improved since starting treatment. He has quit drinking recently, congratulated patient on his success.  ? ?He is also due for his HepB vaccine, will give Heplisav today. He should also be vaccinated against HepA. He is unaware of being vaccinated in the past. Will get HepA antibody to assess immunity.  ?  ?Plan: ?-Continue Mavyret x 4 weeks ?-Hep B vaccine ?-Second HepB vaccine at next visit  ?-Hep A antibody ?-Follow up 09/25/21 with Marchelle Folks and 12/31/21 with Tammy Sours ? ?Valeda Malm, Pharm.D. ?PGY-1 Pharmacy Resident ?08/23/2021 2:49 PM ?

## 2021-08-25 LAB — HEPATITIS A ANTIBODY, TOTAL: Hepatitis A AB,Total: NONREACTIVE

## 2021-08-25 LAB — HEPATITIS C RNA QUANTITATIVE
HCV Quantitative Log: <1.18 log IU/mL — ABNORMAL HIGH
HCV RNA, PCR, QN: <15 IU/mL — ABNORMAL HIGH

## 2021-08-27 ENCOUNTER — Telehealth: Payer: Self-pay | Admitting: Pharmacist

## 2021-08-27 NOTE — Telephone Encounter (Signed)
08/27/2021 12:22:35 PM - Virgel Bouquet Ellipta fax to [employer] ?-- Tarry Kos - Monday, August 27, 2021 12:21 PM -- Earlie Server faxed to [employer] for enrollment. ?

## 2021-09-05 ENCOUNTER — Other Ambulatory Visit: Payer: Self-pay

## 2021-09-11 ENCOUNTER — Other Ambulatory Visit: Payer: Self-pay

## 2021-09-11 ENCOUNTER — Ambulatory Visit: Payer: Medicaid Other | Admitting: Gerontology

## 2021-09-11 ENCOUNTER — Encounter: Payer: Self-pay | Admitting: Gerontology

## 2021-09-11 VITALS — BP 138/83 | HR 105 | Temp 97.5°F | Resp 18 | Ht 72.0 in | Wt 257.1 lb

## 2021-09-11 DIAGNOSIS — G8929 Other chronic pain: Secondary | ICD-10-CM

## 2021-09-11 DIAGNOSIS — R059 Cough, unspecified: Secondary | ICD-10-CM | POA: Insufficient documentation

## 2021-09-11 DIAGNOSIS — R051 Acute cough: Secondary | ICD-10-CM

## 2021-09-11 MED ORDER — MELOXICAM 7.5 MG PO TABS
7.5000 mg | ORAL_TABLET | Freq: Every day | ORAL | 1 refills | Status: DC
  Filled 2021-09-11: qty 30, 30d supply, fill #0
  Filled 2021-10-09: qty 30, 30d supply, fill #1

## 2021-09-11 MED ORDER — MELOXICAM 7.5 MG PO TABS
7.5000 mg | ORAL_TABLET | Freq: Every day | ORAL | 0 refills | Status: DC
  Filled 2021-09-11: qty 30, 30d supply, fill #0

## 2021-09-11 MED ORDER — BENZONATATE 100 MG PO CAPS
100.0000 mg | ORAL_CAPSULE | Freq: Three times a day (TID) | ORAL | 0 refills | Status: DC | PRN
  Filled 2021-09-11: qty 20, 7d supply, fill #0

## 2021-09-11 NOTE — Progress Notes (Signed)
? ?Established Patient Office Visit ? ?Subjective   ?Patient ID: Orson Slick II, male    DOB: 1981-03-06  Age: 41 y.o. MRN: [id number] ? ?Chief Complaint  ?Patient presents with  ? Follow-up  ? Cough  ?  Patient c/o productive cough (green) x 3-4 days. Patient denies fever. Patient states he had a sore throat and headache, but have resolved. Patient has not taken a covid test.   ? ? ?HPI ?is a 41 y/o male, who has history of Asthma, COPD, Hepatitis C, Heroin use , and History of balanitis presents for routine follow up visit. He was seen at the Infectious disease clinic on 08/23/21 and will continue on [place] for 4 weeks for hepatitis C. He c/o productive cough with brownish green phelgm that has being going on for 4 days. He states that he experiences intermittent pleuritic chest pain with coughing. He had chest x ray done on 08/15/21 and it showed Hyperinflated lungs suggesting COPD. No radiographic evidence of acute cardiopulmonary process. He states that cough wakes him up at night. He denies chest tightness, fever, chills, admits to occasional shortness of breath with exertion. His heart rate was 105 bpm, he denies chest pain. He smokes 1/2 pack of cigarette every other day and admits the desire to quit. He states that Meloxicam relieves his neck, knee and hand pain. Overall, he states that he's doing well and offers no further concerns. ? ? ? ?Review of Systems  ?Constitutional: Negative.   ?Respiratory:  Positive for cough and shortness of breath.   ?Cardiovascular: Negative.   ?Skin: Negative.   ?Neurological: Negative.   ?Psychiatric/Behavioral: Negative.    ? ?  ?Objective:  ?  ? ?BP 138/83 (BP Location: Left Arm, Patient Position: Sitting, Cuff Size: Large)   Pulse (!) 105   Temp (!) 97.5 ?F (36.4 ?C) (Oral)   Resp 18   Ht 6' (1.829 m)   Wt 257 lb 1.6 oz (116.6 kg)   SpO2 91%   BMI 34.87 kg/m?  ? ? ?Physical Exam ?HENT:  ?   Head: Normocephalic and atraumatic.  ?Cardiovascular:  ?   Rate and  Rhythm: Tachycardia present.  ?   Pulses: Normal pulses.  ?   Heart sounds: Normal heart sounds.  ?Pulmonary:  ?   Effort: Pulmonary effort is normal.  ?   Breath sounds: Normal breath sounds.  ?Skin: ?   General: Skin is warm.  ?Neurological:  ?   General: No focal deficit present.  ?   Mental Status: He is alert and oriented to person, place, and time. Mental status is at baseline.  ?Psychiatric:     ?   Mood and Affect: Mood normal.     ?   Behavior: Behavior normal.     ?   Thought Content: Thought content normal.     ?   Judgment: Judgment normal.  ? ? ? ? ? ? ? ? ? ?  ?Assessment & Plan:  ? ?1. Chronic pain of right knee ?- He will continue on current medication, and will follow up with Dr Jefm [facility] at [facility]. ?- meloxicam (MOBIC) 7.5 MG tablet; Take 1 tablet (7.5 mg total) by mouth once daily.  Dispense: 30 tablet; Refill: 1 ? ?2. Acute cough ?- He will continue on Tessalon Perles at night and OTC Guaifenesin during the day. He was advised to notify clinic for worsening symptoms. ?- benzonatate (TESSALON PERLES) 100 MG capsule; Take 1 capsule (100 mg total) by mouth 3 (  three) times daily as needed for cough.  Dispense: 20 capsule; Refill: 0 ? ? ?Follow up: F/U 12/11/21, if symptoms worsen or fail to improve. ? ?Jasiyah Paulding Jerold Coombe, NP ? ?

## 2021-09-12 ENCOUNTER — Other Ambulatory Visit: Payer: Self-pay

## 2021-09-12 MED ORDER — GUAIFENESIN 100 MG/5ML PO LIQD
100.0000 mg | Freq: Four times a day (QID) | ORAL | 0 refills | Status: DC | PRN
  Filled 2021-09-12: qty 118, 4d supply, fill #0
  Filled 2021-09-13: qty 118, 6d supply, fill #0

## 2021-09-13 ENCOUNTER — Other Ambulatory Visit: Payer: Self-pay

## 2021-09-14 ENCOUNTER — Emergency Department: Payer: Medicaid Other

## 2021-09-14 ENCOUNTER — Emergency Department
Admission: EM | Admit: 2021-09-14 | Discharge: 2021-09-14 | Disposition: A | Discharge status: Home / Self Care | Payer: Medicaid Other | Attending: Emergency Medicine | Admitting: Emergency Medicine

## 2021-09-14 ENCOUNTER — Other Ambulatory Visit: Payer: Self-pay

## 2021-09-14 DIAGNOSIS — J45901 Unspecified asthma with (acute) exacerbation: Secondary | ICD-10-CM

## 2021-09-14 DIAGNOSIS — Z7951 Long term (current) use of inhaled steroids: Secondary | ICD-10-CM | POA: Insufficient documentation

## 2021-09-14 DIAGNOSIS — Z20822 Contact with and (suspected) exposure to covid-19: Secondary | ICD-10-CM | POA: Insufficient documentation

## 2021-09-14 DIAGNOSIS — J441 Chronic obstructive pulmonary disease with (acute) exacerbation: Secondary | ICD-10-CM | POA: Insufficient documentation

## 2021-09-14 DIAGNOSIS — F17200 Nicotine dependence, unspecified, uncomplicated: Secondary | ICD-10-CM | POA: Insufficient documentation

## 2021-09-14 DIAGNOSIS — J45909 Unspecified asthma, uncomplicated: Secondary | ICD-10-CM | POA: Insufficient documentation

## 2021-09-14 DIAGNOSIS — I10 Essential (primary) hypertension: Secondary | ICD-10-CM | POA: Insufficient documentation

## 2021-09-14 DIAGNOSIS — Z79899 Other long term (current) drug therapy: Secondary | ICD-10-CM | POA: Insufficient documentation

## 2021-09-14 LAB — RESP PANEL BY RT-PCR (FLU A&B, COVID) ARPGX2
Influenza A by PCR: NEGATIVE
Influenza B by PCR: NEGATIVE
SARS Coronavirus 2 by RT PCR: NEGATIVE

## 2021-09-14 MED ORDER — DOXYCYCLINE HYCLATE 100 MG PO CAPS: 100.0000 mg | ORAL_CAPSULE | Freq: Two times a day (BID) | ORAL | 0 refills | Status: DC

## 2021-09-14 MED ORDER — MONTELUKAST SODIUM 10 MG PO TABS
10.0000 mg | ORAL_TABLET | Freq: Every day | ORAL | 0 refills | Status: DC
  Filled 2021-09-14: qty 30, 30d supply, fill #0

## 2021-09-14 MED ORDER — PREDNISONE 20 MG PO TABS
ORAL_TABLET | ORAL | 0 refills | Status: DC
Start: 2021-09-14 — End: 2021-12-11
  Filled 2021-09-14: qty 12, 4d supply, fill #0

## 2021-09-14 MED ORDER — DOXYCYCLINE HYCLATE 100 MG PO CAPS
100.0000 mg | ORAL_CAPSULE | Freq: Two times a day (BID) | ORAL | 0 refills | Status: DC
  Filled 2021-09-14: qty 14, 7d supply, fill #0

## 2021-09-14 MED ORDER — IPRATROPIUM-ALBUTEROL 0.5-2.5 (3) MG/3ML IN SOLN
3.0000 mL | Freq: Once | RESPIRATORY_TRACT | Status: AC
  Administered 2021-09-14: 3 mL via RESPIRATORY_TRACT
  Filled 2021-09-14: qty 3

## 2021-09-14 NOTE — ED Notes (Addendum)
Pt reports HA, congestion, green phlegm, cough, chills, sweats. Pt in NAD. Pt reports takes BP meds regularly since getting out of prison. Pt reports smokes daily. Denies seasonal allergies.  ?

## 2021-09-14 NOTE — ED Triage Notes (Signed)
Patient to ER via POV with reports of flu like symptoms since Saturday. Reports green coloured sputum. Reports generalized body aches and fatigue.  ?

## 2021-09-14 NOTE — ED Provider Notes (Signed)
? ?[facility] ?Provider Note ? ? ? Event Date/Time  ? First MD Initiated Contact with Patient 09/14/21 1331   ?  (approximate) ? ? ?History  ? ?Nasal Congestion ? ? ?HPI ? ?Nathan Vasquez is a 41 y.o. male   presents to the ED with complaint of flulike symptoms for 1 week.  Patient states that he has green-colored sputum with generalized body aches and fatigue.  Patient states he has also had a subjective fever.  He denies any vomiting or diarrhea.  He is unaware of any known exposure to anyone sick.  Patient does have a history of COPD/asthma, hypertension, hepatitis C, anxiety disorder and GERD.  Reports that he still continues to smoke despite his efforts to discontinue. ? ?  ? ? ?Physical Exam  ? ?Triage Vital Signs: ?ED Triage Vitals  ?Enc Vitals Group  ?   BP 09/14/21 1209 130/90  ?   Pulse Rate 09/14/21 1206 (!) 105  ?   Resp 09/14/21 1206 18  ?   Temp 09/14/21 1209 98.2 ?F (36.8 ?C)  ?   Temp src --   ?   SpO2 09/14/21 1206 98 %  ?   Weight --   ?   Height 09/14/21 1207 6' (1.829 m)  ?   Head Circumference --   ?   Peak Flow --   ?   Pain Score 09/14/21 1207 6  ?   Pain Loc --   ?   Pain Edu? --   ?   Excl. in [place]? --   ? ? ?Most recent vital signs: ?Vitals:  ? 09/14/21 1421 09/14/21 1513  ?BP: (!) 134/91 118/70  ?Pulse:  98  ?Resp:  20  ?Temp:    ?SpO2:  98%  ? ? ? ?General: Awake, no distress.  Able to speak in complete sentences without any difficulty. ?CV:  Good peripheral perfusion.  Regular rate and rhythm without murmur. ?Resp:  Normal effort.  Lung sounds are decreased bilaterally and patient has a congested cough. ?Abd:  No distention.  ?Other:  HEENT exam is benign. ? ? ?ED Results / Procedures / Treatments  ? ?Labs ?(all labs ordered are listed, but only abnormal results are displayed) ?Labs Reviewed  ?RESP PANEL BY RT-PCR (FLU A&B, COVID) [id number]  ? ? ? ? ?RADIOLOGY ?Chest x-ray was negative for infiltrate.  Radiology report mentions COPD changes but no infiltrate  present. ? ? ? ?PROCEDURES: ? ?Critical Care performed:  ? ?Procedures ? ? ?MEDICATIONS ORDERED IN ED: ?Medications  ?ipratropium-albuterol (DUONEB) 0.5-2.5 (3) MG/3ML nebulizer solution 3 mL (3 mLs Nebulization Given 09/14/21 1447)  ? ? ? ?IMPRESSION / MDM / ASSESSMENT AND PLAN / ED COURSE  ?I reviewed the triage vital signs and the nursing notes. ? ? ?Differential diagnosis includes, but is not limited to, exacerbation of asthma, bronchitis, pneumonia, influenza, COVID. ? ?41 year old male presents to the ED with complaint of flulike symptoms for 1 week.  Patient also has an exacerbation of his asthma and has been using his albuterol inhaler and nebulizer machine more often than usual.  Patient is under the impression that with the [place] for his hepatitis C that he is not to take steroids.  Dr. Leory Plowman was also consulted in regards to steroids for this patient.  There is no interaction between the steroids and this medication but may decrease the effectiveness.  Patient improved slightly with a DuoNeb treatment while in the ED.  He currently is already  using Breo, albuterol inhaler and nebulizer and still remains extremely uncomfortable especially at night.  I discussed with him at length the importance of increasing his lung capacity and breathing versus decreasing the effectiveness of his hepatitis medication.  He advises me that he is on the last 7 days of this medication and because his wheezing is exacerbated he is willing to do a short course of steroids.  He is to continue with the albuterol at home, will add Singulair 10 mg at bedtime, prednisone 60 mg daily for the next 4 days and doxycycline 100 mg twice daily for 7 days to cover any infection by lowering his immune system with the steroids.  Patient is agreeable to all of this.  He was slightly improved prior to discharge.  Vitals were stable with blood pressure 118/70 and O2 sat of 98%. ? ? ? ?  ? ? ?FINAL CLINICAL IMPRESSION(S) / ED DIAGNOSES   ? ?Final diagnoses:  ?Acute exacerbation of COPD with asthma ([place])  ? ? ? ?Rx / DC Orders  ? ?ED Discharge Orders   ? ?      Ordered  ?  predniSONE (DELTASONE) 20 MG tablet       ? 09/14/21 1504  ?  montelukast (SINGULAIR) 10 MG tablet  Daily at bedtime       ? 09/14/21 1504  ?  doxycycline (VIBRAMYCIN) 100 MG capsule  2 times daily,   Status:  Discontinued       ? 09/14/21 1504  ?  doxycycline (VIBRAMYCIN) 100 MG capsule  2 times daily       ? 09/14/21 1504  ? ?  ?  ? ?  ? ? ? ?Note:  This document was prepared using Dragon voice recognition software and may include unintentional dictation errors. ?  ?Johnn Hai, PA-C ?09/14/21 1622 ? ?  ?Naaman Plummer, MD ?09/14/21 1638 ? ?

## 2021-09-14 NOTE — Discharge Instructions (Signed)
Follow-up with your primary care provider if any continued problems or concerns.  Continue to using your inhalers as you have done at home.  We are adding the doxycycline which is an antibiotic to keep her from getting infection, Singulair which is 1 at bedtime and a 4-day burst of steroids instead of a tapered dose.  Return to the emergency department over the weekend if any severe worsening of your breathing. ?

## 2021-09-25 ENCOUNTER — Ambulatory Visit (INDEPENDENT_AMBULATORY_CARE_PROVIDER_SITE_OTHER): Payer: Self-pay | Admitting: Pharmacist

## 2021-09-25 ENCOUNTER — Other Ambulatory Visit: Payer: Self-pay

## 2021-09-25 DIAGNOSIS — B182 Chronic viral hepatitis C: Secondary | ICD-10-CM

## 2021-09-25 NOTE — Progress Notes (Signed)
? ?09/25/2021 ? ?HPI: Nathan Vasquez is a 41 y.o. male who presents to the [facility] pharmacy clinic for Hepatitis C follow-up. ? ?Medication: Mavyret ? ?Start Date: 07/26/21 ? ?Hepatitis C Genotype: 1a ? ?Fibrosis Score: F0 ? ?Hepatitis C RNA: 2.35 million (2/2)> undetectable (3/30) ? ?Patient Active Problem List  ? Diagnosis Date Noted  ? Cough 09/11/2021  ? Shortness of breath 08/14/2021  ? History of gastroesophageal reflux (GERD) 08/14/2021  ? Chronic pain of right knee 08/14/2021  ? Wheezing 08/02/2021  ? Essential hypertension 07/31/2021  ? Encounter to establish care 07/31/2021  ? Blurry vision 07/31/2021  ? Chronic hepatitis C without hepatic coma (HCC) 06/28/2021  ? Asthma 10/28/2019  ? Left knee pain 10/28/2019  ? Smoking 10/28/2019  ? Generalized anxiety disorder 09/28/2019  ? Complaint of debility and malaise 08/04/2016  ? Mild intermittent asthma 08/04/2016  ? Opiate overdose (HCC) 08/04/2016  ? ? ?Patient's Medications  ?New Prescriptions  ? No medications on file  ?Previous Medications  ? ALBUTEROL (PROVENTIL HFA) 108 (90 BASE) MCG/ACT INHALER    Inhale 1-2 puffs into the lungs once every 6 (six) hours as needed for wheezing or shortness of breath.  ? AMLODIPINE (NORVASC) 10 MG TABLET    Take 1 tablet (10 mg total) by mouth once daily.  ? BENZONATATE (TESSALON PERLES) 100 MG CAPSULE    Take 1 capsule (100 mg total) by mouth 3 (three) times daily as needed for cough.  ? DOXYCYCLINE (VIBRAMYCIN) 100 MG CAPSULE    Take 1 capsule (100 mg total) by mouth 2 (two) times daily.  ? FLUTICASONE FUROATE-VILANTEROL (BREO ELLIPTA) 200-25 MCG/ACT AEPB    Inhale 1 puff into the lungs once daily.  ? GLECAPREVIR-PIBRENTASVIR (MAVYRET) 100-40 MG TABS    Take 3 tablets by mouth daily.  ? MELOXICAM (MOBIC) 7.5 MG TABLET    Take 1 tablet (7.5 mg total) by mouth once daily.  ? MONTELUKAST (SINGULAIR) 10 MG TABLET    Take 1 tablet (10 mg total) by mouth once nightly at bedtime.  ? OMEPRAZOLE (PRILOSEC) 20 MG CAPSULE     Take 1 capsule (20 mg total) by mouth once daily.  ? PREDNISONE (DELTASONE) 20 MG TABLET    Take 3 tablets by mouth once a day for 4 days  ?Modified Medications  ? No medications on file  ?Discontinued Medications  ? No medications on file  ? ? ?Allergies: ?Allergies  ?Allergen Reactions  ? Penicillins Rash  ? ? ?Past Medical History: ?Past Medical History:  ?Diagnosis Date  ? Asthma   ? COPD (chronic obstructive pulmonary disease) (HCC)   ? Hep C w/o coma, chronic (HCC)   ? Heroin abuse (HCC)   ? History of balanitis   ? Hypertension   ? ? ?Social History: ?Social History  ? ?Socioeconomic History  ? Marital status: Media planner  ?  Spouse name: Not on file  ? Number of children: 1  ? Years of education: 6th grade  ? Highest education level: 6th grade  ?Occupational History  ? Occupation: NA  ?Tobacco Use  ? Smoking status: Every Day  ?  Packs/day: 0.50  ?  Years: 25.00  ?  Pack years: 12.50  ?  Types: Cigarettes  ? Smokeless tobacco: Current  ? Tobacco comments:  ?  Patient has cut down to 1/4 ppd. Gave [place] Quit Info. Patient occasionally uses dip  ?Vaping Use  ? Vaping Use: Former  ? Quit date: 12/21/2019  ?Substance and Sexual  Activity  ? Alcohol use: Not Currently  ?  Alcohol/week: 1.0 standard drink  ?  Types: 1 Cans of beer per week  ?  Comment: last use 12/21/19  ? Drug use: Not Currently  ?  Frequency: 7.0 times per week  ?  Types: Heroin, Marijuana  ?  Comment: last use 12/21/19  heroin.  this morning, trying to quit but finds it difficult with pain/sickness  ? Sexual activity: Yes  ?  Partners: Female  ?Other Topics Concern  ? Not on file  ?Social History Narrative  ? Not on file  ? ?Social Determinants of Health  ? ?Financial Resource Strain: Not on file  ?Food Insecurity: Food Insecurity Present  ? Worried About Programme researcher, broadcasting/film/video in the Last Year: Sometimes true  ? Ran Out of Food in the Last Year: Sometimes true  ?Transportation Needs: No Transportation Needs  ? Lack of Transportation (Medical): No   ? Lack of Transportation (Non-Medical): No  ?Physical Activity: Not on file  ?Stress: Not on file  ?Social Connections: Not on file  ? ? ?Labs: ?Hepatitis C ?Lab Results  ?Component Value Date  ? HCVGENOTYPE 1a 06/28/2021  ? HCVRNAPCRQN <15 (H) 08/23/2021  ? HCVRNAPCRQN 2,350,000 (H) 06/28/2021  ? FIBROSTAGE F0 06/28/2021  ? ?Hepatitis B ?Lab Results  ?Component Value Date  ? HEPBSAG NON-REACTIVE 06/28/2021  ? ?Hepatitis A ?Lab Results  ?Component Value Date  ? HAV NON-REACTIVE 08/23/2021  ? ?HIV ?Lab Results  ?Component Value Date  ? HIV NON-REACTIVE 06/28/2021  ? HIV NON REACTIVE 06/12/2017  ? ?Lab Results  ?Component Value Date  ? CREATININE 0.95 07/31/2021  ? CREATININE 0.87 09/30/2019  ? CREATININE 0.87 01/16/2019  ? CREATININE 0.84 09/16/2017  ? CREATININE 0.89 09/13/2017  ? ?Lab Results  ?Component Value Date  ? AST 13 07/31/2021  ? AST 22 06/28/2021  ? AST 18 09/30/2019  ? ALT 23 07/31/2021  ? ALT 31 06/28/2021  ? ALT 33 06/28/2021  ? INR 0.9 06/28/2021  ? ? ?Assessment: ?Nathan Vasquez presents today for his end of therapy visit for Hepatitis C treatment. He has been on Mavyret for 8 weeks and only had some mild nausea while on treatment. He states that he only missed one dose, but was able to take the next morning. He states that he is feeling well and happy to have completed therapy. I congratulated him on completing therapy and explained that we will follow-up in 3 months for SVR testing.  ? ? ?Plan: ?- Check HCV RNA ?- Follow-up with Greg on 12/31/21 @ 2:30 ? ?Shirlee More, PharmD ?PGY2 Infectious Diseases Pharmacy Resident ?  ? ?

## 2021-09-27 ENCOUNTER — Other Ambulatory Visit: Payer: Self-pay

## 2021-09-27 LAB — HEPATITIS C RNA QUANTITATIVE
HCV Quantitative Log: <1.18 log IU/mL
HCV RNA, PCR, QN: <15 IU/mL

## 2021-10-09 ENCOUNTER — Other Ambulatory Visit: Payer: Self-pay

## 2021-10-09 ENCOUNTER — Other Ambulatory Visit: Payer: Self-pay | Admitting: Emergency Medicine

## 2021-10-09 DIAGNOSIS — J4530 Mild persistent asthma, uncomplicated: Secondary | ICD-10-CM

## 2021-10-09 MED ORDER — MONTELUKAST SODIUM 10 MG PO TABS
10.0000 mg | ORAL_TABLET | Freq: Every day | ORAL | 0 refills | Status: DC
  Filled 2021-10-09 – 2021-11-08 (×2): qty 30, 30d supply, fill #0
  Filled 2021-11-08 – 2021-12-02 (×2): qty 30, 30d supply, fill #1

## 2021-11-01 ENCOUNTER — Other Ambulatory Visit: Payer: Self-pay

## 2021-11-06 ENCOUNTER — Other Ambulatory Visit: Payer: Self-pay

## 2021-11-08 ENCOUNTER — Other Ambulatory Visit: Payer: Self-pay | Admitting: Gerontology

## 2021-11-08 ENCOUNTER — Other Ambulatory Visit: Payer: Self-pay

## 2021-11-08 DIAGNOSIS — G8929 Other chronic pain: Secondary | ICD-10-CM

## 2021-11-08 MED ORDER — MELOXICAM 7.5 MG PO TABS
7.5000 mg | ORAL_TABLET | Freq: Every day | ORAL | 1 refills | Status: DC
  Filled 2021-11-08: qty 30, 30d supply, fill #0
  Filled 2021-12-02: qty 30, 30d supply, fill #1

## 2021-11-09 ENCOUNTER — Other Ambulatory Visit: Payer: Self-pay

## 2021-11-13 ENCOUNTER — Other Ambulatory Visit: Payer: Self-pay

## 2021-11-26 ENCOUNTER — Emergency Department: Payer: Medicaid Other

## 2021-11-26 ENCOUNTER — Other Ambulatory Visit: Payer: Self-pay

## 2021-11-26 ENCOUNTER — Emergency Department
Admission: EM | Admit: 2021-11-26 | Discharge: 2021-11-27 | Disposition: A | Discharge status: Home / Self Care | Payer: Medicaid Other | Attending: Emergency Medicine | Admitting: Emergency Medicine

## 2021-11-26 DIAGNOSIS — T405X1A Poisoning by cocaine, accidental (unintentional), initial encounter: Secondary | ICD-10-CM | POA: Insufficient documentation

## 2021-11-26 DIAGNOSIS — T50901A Poisoning by unspecified drugs, medicaments and biological substances, accidental (unintentional), initial encounter: Secondary | ICD-10-CM

## 2021-11-26 DIAGNOSIS — I10 Essential (primary) hypertension: Secondary | ICD-10-CM | POA: Insufficient documentation

## 2021-11-26 DIAGNOSIS — J4520 Mild intermittent asthma, uncomplicated: Secondary | ICD-10-CM | POA: Insufficient documentation

## 2021-11-26 DIAGNOSIS — J449 Chronic obstructive pulmonary disease, unspecified: Secondary | ICD-10-CM | POA: Insufficient documentation

## 2021-11-26 DIAGNOSIS — F17200 Nicotine dependence, unspecified, uncomplicated: Secondary | ICD-10-CM | POA: Insufficient documentation

## 2021-11-26 LAB — CBG MONITORING, ED: Glucose-Capillary: 86 mg/dL (ref 70–99)

## 2021-11-26 MED ORDER — SODIUM CHLORIDE 0.9 % IV BOLUS
1000.0000 mL | Freq: Once | INTRAVENOUS | Status: AC
  Administered 2021-11-26: 1000 mL via INTRAVENOUS

## 2021-11-26 NOTE — ED Triage Notes (Signed)
Pt states he started drinking beer around 2 pm. Pt thinks he snorted some coke maybe around 8pm. Pt was found unconscious in his car. When fire arrived pt O2 was in the 40% area. Pt was given narcan and woke up. Pt has maintained his airway since.

## 2021-11-26 NOTE — ED Provider Notes (Signed)
[facility] Provider Note    Event Date/Time   First MD Initiated Contact with Patient 11/26/21 2250     (approximate)   History   Drug Overdose   HPI  Nathan Vasquez is a 41 y.o. male  with pmh COPD/asthma, hep C, polysubstance use who presents after an overdose.  Patient was drinking alcohol today from 2 PM till 8 PM.  He then took a hit of what he felt was cocaine.  He was found unconscious in his car.  Was found to be hypoxic and minimally responsive satting 40%.  He received Narcan and has had improved mental status since.  Patient says he does not remember from 8 PM till 10 PM.  He endorses feeling somewhat dehydrated and thirsty and nauseous but denies abdominal pain or vomiting.  He denies any chest pain shortness of breath.  He does not typically use opiates.  He denies intentional overdose.  Does endorse difficulty hearing out of the bilateral ears.  No visual change numbness tingling weakness.    Past Medical History:  Diagnosis Date   Asthma    COPD (chronic obstructive pulmonary disease) (HCC)    Hep C w/o coma, chronic (HCC)    Heroin abuse (HCC)    History of balanitis    Hypertension     Patient Active Problem List   Diagnosis Date Noted   Cough 09/11/2021   Shortness of breath 08/14/2021   History of gastroesophageal reflux (GERD) 08/14/2021   Chronic pain of right knee 08/14/2021   Wheezing 08/02/2021   Essential hypertension 07/31/2021   Encounter to establish care 07/31/2021   Blurry vision 07/31/2021   Chronic hepatitis C without hepatic coma (HCC) 06/28/2021   Asthma 10/28/2019   Left knee pain 10/28/2019   Smoking 10/28/2019   Generalized anxiety disorder 09/28/2019   Complaint of debility and malaise 08/04/2016   Mild intermittent asthma 08/04/2016   Opiate overdose (HCC) 08/04/2016     Physical Exam  Triage Vital Signs: ED Triage Vitals  Enc Vitals Group     BP 11/26/21 2249 (!) 134/94     Pulse Rate  11/26/21 2249 86     Resp 11/26/21 2249 17     Temp 11/26/21 2249 98.4 F (36.9 C)     Temp Source 11/26/21 2249 Oral     SpO2 11/26/21 2242 94 %     Weight 11/26/21 2246 250 lb (113.4 kg)     Height 11/26/21 2246 6' (1.829 m)     Head Circumference --      Peak Flow --      Pain Score 11/26/21 2246 0     Pain Loc --      Pain Edu? --      Excl. in [place]? --     Most recent vital signs: Vitals:   11/26/21 2242 11/26/21 2249  BP:  (!) 134/94  Pulse:  86  Resp:  17  Temp:  98.4 F (36.9 C)  SpO2: 94% 100%     General: Awake, no distress.  CV:  Good peripheral perfusion.  Resp:  Normal effort. No wheezing Abd:  No distention.  Neuro:             Awake, Alert, Oriented x 3  Other:  Aox3, nml speech  PERRL, EOMI, face symmetric, nml tongue movement  5/5 strength in the BL upper and lower extremities  Sensation grossly intact in the BL upper and lower extremities  Finger-nose-finger  intact BL    ED Results / Procedures / Treatments  Labs (all labs ordered are listed, but only abnormal results are displayed) Labs Reviewed  CBG MONITORING, ED     EKG  EKG interpretation performed by myself: sinus tach, nml axis, nml intervals, no acute ischemic changes    RADIOLOGY I reviewed and interpreted the CT scan of the brain which does not show any acute intracranial process    PROCEDURES:  Critical Care performed: No  .1-3 Lead EKG Interpretation  Performed by: Georga Hacking, MD Authorized by: Georga Hacking, MD     Interpretation: normal     ECG rate assessment: normal     Rhythm: sinus rhythm     Ectopy: none     Conduction: normal     The patient is on the cardiac monitor to evaluate for evidence of arrhythmia and/or significant heart rate changes.   MEDICATIONS ORDERED IN ED: Medications  sodium chloride 0.9 % bolus 1,000 mL (1,000 mLs Intravenous New Bag/Given 11/26/21 2333)     IMPRESSION / MDM / ASSESSMENT AND PLAN / ED COURSE  I  reviewed the triage vital signs and the nursing notes.                              Patient's presentation is most consistent with acute presentation with potential threat to life or bodily function.  Differential diagnosis includes, but is not limited to, accidental opiate overdose, alcohol intoxication, less likely intracranial hemorrhage, CVA  The patient is a 41 year old male who presents after an unintentional overdose.  He had been drinking alcohol today and then took creatively thought was cocaine but then was found unconscious and responded to Narcan.  He is now awake alert and oriented satting 100% on room air is not in any respiratory distress.  He does endorse feeling somewhat dehydrated and his only real complaint is bilateral difficulty hearing which is new.  Neurologic exam is intact.  No signs of trauma.  He is not wheezing.  Transient hearing loss can happen after substance use.  I am not concerned about CVA or other intracranial process given his exam is otherwise nonfocal.  Plan to rehydrate orally and with IV fluid bolus.  Will check blood sugar EKG and CT head.  Will observe but anticipate she will be stable for discharge.      FINAL CLINICAL IMPRESSION(S) / ED DIAGNOSES   Final diagnoses:  None     Rx / DC Orders   ED Discharge Orders     None        Note:  This document was prepared using Dragon voice recognition software and may include unintentional dictation errors.

## 2021-11-27 NOTE — ED Notes (Signed)
Pt refused DC vital and gave verbal consent to DC. Pt states he has a ride waiting on him.

## 2021-11-28 ENCOUNTER — Other Ambulatory Visit: Payer: Self-pay

## 2021-12-02 ENCOUNTER — Other Ambulatory Visit: Payer: Self-pay

## 2021-12-03 ENCOUNTER — Other Ambulatory Visit: Payer: Self-pay

## 2021-12-11 ENCOUNTER — Other Ambulatory Visit: Payer: Self-pay

## 2021-12-11 ENCOUNTER — Encounter: Payer: Self-pay | Admitting: Gerontology

## 2021-12-11 ENCOUNTER — Ambulatory Visit: Payer: Medicaid Other | Admitting: Gerontology

## 2021-12-11 VITALS — BP 142/96 | HR 94 | Temp 97.6°F | Resp 17 | Ht 72.0 in | Wt 248.6 lb

## 2021-12-11 DIAGNOSIS — B182 Chronic viral hepatitis C: Secondary | ICD-10-CM

## 2021-12-11 DIAGNOSIS — J4530 Mild persistent asthma, uncomplicated: Secondary | ICD-10-CM

## 2021-12-11 DIAGNOSIS — I10 Essential (primary) hypertension: Secondary | ICD-10-CM

## 2021-12-11 DIAGNOSIS — R0602 Shortness of breath: Secondary | ICD-10-CM

## 2021-12-11 DIAGNOSIS — M79604 Pain in right leg: Secondary | ICD-10-CM

## 2021-12-11 DIAGNOSIS — G8929 Other chronic pain: Secondary | ICD-10-CM

## 2021-12-11 DIAGNOSIS — Z8719 Personal history of other diseases of the digestive system: Secondary | ICD-10-CM

## 2021-12-11 MED ORDER — FLUTICASONE FUROATE-VILANTEROL 200-25 MCG/ACT IN AEPB
1.0000 | INHALATION_SPRAY | Freq: Every day | RESPIRATORY_TRACT | 4 refills | Status: DC
  Filled 2021-12-11: qty 60, 60d supply, fill #0
  Filled 2022-01-01: qty 120, 60d supply, fill #0
  Filled 2022-03-06 – 2022-03-08 (×2): qty 120, 60d supply, fill #1
  Filled 2022-03-12: qty 180, 90d supply, fill #1

## 2021-12-11 MED ORDER — OMEPRAZOLE 20 MG PO CPDR
20.0000 mg | DELAYED_RELEASE_CAPSULE | Freq: Every day | ORAL | 2 refills | Status: DC
  Filled 2021-12-11: qty 30, 30d supply, fill #0
  Filled 2022-01-01 – 2022-01-30 (×3): qty 30, 30d supply, fill #1
  Filled 2022-03-06 (×2): qty 30, 30d supply, fill #2

## 2021-12-11 MED ORDER — ALBUTEROL SULFATE HFA 108 (90 BASE) MCG/ACT IN AERS
1.0000 | INHALATION_SPRAY | Freq: Four times a day (QID) | RESPIRATORY_TRACT | 3 refills | Status: DC | PRN
  Filled 2021-12-11: qty 6.7, 25d supply, fill #0
  Filled 2022-01-01 – 2022-01-11 (×2): qty 6.7, 25d supply, fill #1
  Filled 2022-01-30: qty 6.7, 25d supply, fill #2
  Filled 2022-02-26: qty 6.7, 25d supply, fill #3

## 2021-12-11 MED ORDER — AMLODIPINE BESYLATE 10 MG PO TABS
10.0000 mg | ORAL_TABLET | Freq: Every day | ORAL | 1 refills | Status: DC
  Filled 2021-12-11 – 2022-01-01 (×2): qty 90, 90d supply, fill #0
  Filled 2022-03-29: qty 90, 90d supply, fill #1

## 2021-12-11 MED ORDER — MELOXICAM 7.5 MG PO TABS
7.5000 mg | ORAL_TABLET | Freq: Every day | ORAL | 0 refills | Status: DC
  Filled 2021-12-11 – 2022-01-01 (×2): qty 30, 30d supply, fill #0

## 2021-12-11 MED ORDER — MONTELUKAST SODIUM 10 MG PO TABS
10.0000 mg | ORAL_TABLET | Freq: Every day | ORAL | 0 refills | Status: DC
  Filled 2021-12-11 – 2022-01-01 (×2): qty 90, 90d supply, fill #0

## 2021-12-11 NOTE — Progress Notes (Signed)
Established Patient Office Visit  Subjective   Patient ID: Nathan Vasquez, male    DOB: [birth year]  Age: 41 y.o. MRN: [id number]  Chief Complaint  Patient presents with   Follow-up   Edema    Patient c/o right leg swelling and pain x 2 weeks. No acute injury noted.    HPI  is a 41 y/o male, who has history of Asthma, COPD, Hepatitis C, Heroin use , and History of balanitis presents for c/o  non traumatic right leg swelling and pain that has been going on for 2 weeks and medication refill. He states that since he hyperextended his right knee 1 year ago while incarcerated, he's been experiencing intermittent  pain and swelling.  He states that currently, pain is a constant, dull 3/10, standing  for more tan 1 hour aggravates symptoms. He denies claudication, discoloration, muscle nor motor weakness. He was seen at the ED on 11/26/21 for  polysubstance overdose and states that he was referred for Opioid use disorder treatment but he has no transportation. Overall, he states that he's doing well and offers no further complaint.    Review of Systems  Respiratory: Negative.    Cardiovascular:  Positive for leg swelling (right leg).  Neurological: Negative.   Psychiatric/Behavioral: Negative.        Objective:     BP (!) 142/96 (BP Location: Left Arm, Patient Position: Sitting, Cuff Size: Large)   Pulse 94   Temp 97.6 F (36.4 C) (Oral)   Resp 17   Ht 6' (1.829 m)   Wt 248 lb 9.6 oz (112.8 kg)   SpO2 95%   BMI 33.72 kg/m  BP Readings from Last 3 Encounters:  12/11/21 (!) 142/96  11/26/21 (!) 134/94  09/14/21 118/70   Wt Readings from Last 3 Encounters:  12/11/21 248 lb 9.6 oz (112.8 kg)  11/26/21 250 lb (113.4 kg)  09/11/21 257 lb 1.6 oz (116.6 kg)      Physical Exam HENT:     Head: Normocephalic and atraumatic.  Cardiovascular:     Pulses: Normal pulses.     Heart sounds: Normal heart sounds.  Pulmonary:     Effort: Pulmonary effort is normal.     Breath  sounds: Normal breath sounds.  Musculoskeletal:        General: Swelling (trace edema to right lower leg, tenderness with palpation.) present.  Neurological:     General: No focal deficit present.     Mental Status: He is alert and oriented to person, place, and time.  Psychiatric:        Mood and Affect: Mood normal.        Behavior: Behavior normal.        Thought Content: Thought content normal.        Judgment: Judgment normal.      No results found for any visits on 12/11/21.  Last CBC Lab Results  Component Value Date   WBC 7.0 06/28/2021   HGB 15.1 06/28/2021   HCT 44.7 06/28/2021   MCV 93.5 06/28/2021   MCH 31.6 06/28/2021   RDW 12.2 06/28/2021   PLT 187 37/90/2409   Last metabolic panel Lab Results  Component Value Date   GLUCOSE 93 07/31/2021   NA 138 07/31/2021   K 4.6 07/31/2021   CL 100 07/31/2021   CO2 26 07/31/2021   BUN 13 07/31/2021   CREATININE 0.95 07/31/2021   EGFR 104 07/31/2021   CALCIUM 9.5 07/31/2021   PROT 6.9  07/31/2021   ALBUMIN 4.4 07/31/2021   LABGLOB 2.5 07/31/2021   AGRATIO 1.8 07/31/2021   BILITOT 0.4 07/31/2021   ALKPHOS 111 07/31/2021   AST 13 07/31/2021   ALT 23 07/31/2021   ANIONGAP 8 01/16/2019   Last lipids Lab Results  Component Value Date   CHOL 171 07/31/2021   HDL 77 07/31/2021   LDLCALC 70 07/31/2021   TRIG 139 07/31/2021   CHOLHDL 2.2 07/31/2021   Last hemoglobin A1c Lab Results  Component Value Date   HGBA1C 4.8 07/31/2021      The 10-year ASCVD risk score (Arnett DK, et al., 2019) is: 2%    Assessment & Plan:   1. Chronic pain of right knee - He will continue on Meloxicam and follow up with Dr Jefm Bryant on 12/18/21 . - meloxicam (MOBIC) 7.5 MG tablet; Take 1 tablet (7.5 mg total) by mouth once daily.  Dispense: 30 tablet; Refill: 0  2. Essential hypertension - His blood pressure is improving, will continue on current medication, and DASH diet. - amLODipine (NORVASC) 10 MG tablet; Take 1 tablet (10  mg total) by mouth once daily.  Dispense: 90 tablet; Refill: 1  3. Right leg pain and swelling -He will continue on Meloxicam and follow up with Dr Jefm Bryant on 12/18/21 . - meloxicam (MOBIC) 7.5 MG tablet; Take 1 tablet (7.5 mg total) by mouth once daily.  Dispense: 30 tablet; Refill: 0 -He was advised to elevate leg while sitting down and go to the ED for worsening symptoms,  4. History of gastroesophageal reflux (GERD) - He will continue on current medication -Avoid spicy, fatty and fried food -Avoid sodas and sour juices -Avoid heavy meals -Avoid eating 4 hours before bedtime - omeprazole (PRILOSEC) 20 MG capsule; Take 1 capsule (20 mg total) by mouth once daily.  Dispense: 30 capsule; Refill: 2  5. Mild persistent asthma, unspecified whether complicated - He will continue on current medication - montelukast (SINGULAIR) 10 MG tablet; Take 1 tablet (10 mg total) by mouth once nightly at bedtime.  Dispense: 90 tablet; Refill: 0  6. Shortness of breath -His breathing is stable, will continue on current medication. - fluticasone furoate-vilanterol (BREO ELLIPTA) 200-25 MCG/ACT AEPB; Inhale 1 puff into the lungs once daily.  Dispense: 60 each; Refill: 4 - albuterol (PROVENTIL HFA) 108 (90 Base) MCG/ACT inhaler; Inhale 1-2 puffs into the lungs once every 6 (six) hours as needed for wheezing or shortness of breath.  Dispense: 6.7 g; Refill: 3    Return in about 9 weeks (around 02/12/2022), or if symptoms worsen or fail to improve.    Josephene Marrone Jerold Coombe, NP

## 2021-12-18 ENCOUNTER — Other Ambulatory Visit: Payer: Self-pay

## 2021-12-18 ENCOUNTER — Ambulatory Visit: Payer: Medicaid Other | Admitting: Rheumatology

## 2021-12-18 ENCOUNTER — Encounter: Payer: Self-pay | Admitting: Rheumatology

## 2021-12-18 VITALS — BP 128/90 | HR 98 | Temp 98.0°F | Ht 72.0 in | Wt 244.1 lb

## 2021-12-18 DIAGNOSIS — M79604 Pain in right leg: Secondary | ICD-10-CM

## 2021-12-18 MED ORDER — GABAPENTIN 100 MG PO CAPS
100.0000 mg | ORAL_CAPSULE | Freq: Three times a day (TID) | ORAL | 0 refills | Status: DC
  Filled 2021-12-18: qty 90, 30d supply, fill #0

## 2021-12-18 NOTE — Progress Notes (Signed)
[facility]  PROGRESS NOTE  Patient:Nathan Vasquez Male   DOB:1981-03-21     41 y.o.  GLO:[id number]  Visit Date: 12/18/2021  HPI:  41 year old white male.  Prior IT trainer.  Recently incarcerated. History of drug abuse.  Hepatitis C.  Treated no evidence of cirrhosis Major complaint is right lateral leg pain.  Did have a twisting injury to the right knee a year ago and the knee swelled.  Was hyperextended.  Prior x-ray of the right knee showed minimal small degenerative change Feels like the leg swells intermittently.  He has had ultrasound and x-ray which were unremarkable.  No significant pain in the back.  He has been on anti-inflammatory drug which has not helped.  Does not have a limp.  There is no numbness in the foot.     Past Medical History:  Diagnosis Date   Asthma    COPD (chronic obstructive pulmonary disease) (HCC)    Hep C w/o coma, chronic (HCC)    Heroin abuse (HCC)    History of balanitis    Hypertension     Past Surgical History:  Procedure Laterality Date   TOOTH EXTRACTION      Social History   Tobacco Use   Smoking status: Every Day    Packs/day: 1.00    Years: 25.00    Total pack years: 25.00    Types: Cigarettes   Smokeless tobacco: Current   Tobacco comments:    Patient has cut down to 1/2 - 1 ppd. Gave [place] Quit Info. Patient occasionally uses dip  Substance Use Topics   Alcohol use: Yes    Alcohol/week: 1.0 standard drink of alcohol    Types: 1 Cans of beer per week    Comment: last use 11/26/21, drank 8 beers     MEDICATIONS: Current Outpatient Medications  Medication Sig Dispense Refill   acetaminophen (TYLENOL) 500 MG tablet Take 1,000 mg by mouth every 6 (six) hours as needed for mild pain.     albuterol (PROVENTIL HFA) 108 (90 Base) MCG/ACT inhaler Inhale 1-2 puffs into the lungs once every 6 (six) hours as needed for wheezing or shortness of breath. 6.7 g 3   amLODipine (NORVASC) 10 MG tablet Take 1  tablet (10 mg total) by mouth once daily. 90 tablet 1   fluticasone furoate-vilanterol (BREO ELLIPTA) 200-25 MCG/ACT AEPB Inhale 1 puff into the lungs once daily. 60 each 4   meloxicam (MOBIC) 7.5 MG tablet Take 1 tablet (7.5 mg total) by mouth once daily. 30 tablet 0   montelukast (SINGULAIR) 10 MG tablet Take 1 tablet (10 mg total) by mouth once nightly at bedtime. 90 tablet 0   omeprazole (PRILOSEC) 20 MG capsule Take 1 capsule (20 mg total) by mouth once daily. 30 capsule 2   No current facility-administered medications for this visit.     ALLERGIES Allergies  Allergen Reactions   Penicillins Rash     PHYSICAL EXAM: Blood pressure 128/90, pulse 98, temperature 98 F (36.7 C), temperature source Oral, height 6' (1.829 m), weight 244 lb 1.6 oz (110.7 kg), SpO2 93 %. Pleasant male.  Shoulders move well upper extremities without synovitis.  Good distal pulses.  Varicosities.  Trace edema. Musculoskeletal: Hips move well.  Knees without effusion.  No definite Baker's cyst.  Lateral joint line of the knee is mildly tender.  Ankle moves well.  Achilles insertion nontender Neurologic: Hypnesthesia of the right lateral leg.  Negative straight leg raising.  Back flexes well.  Able to toe walk heel walk and squat.  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  Doubt radiculopathy.  Could have had prior injury to the knee as an instigator Mild venous insufficiency Hepatitis C without evidence of vasculitis, now treated    PLAN: Encourage support stockings Trial of gabapentin 100 mg 3 times daily.  He has been through anti-inflammatory drugs.  If unimproved, next step might be neurology opinion with nerve conductions.  Would need referral.    Danielle Dess. MD           12/18/2021,  11:45 AM         Thank you appreciate

## 2021-12-19 ENCOUNTER — Other Ambulatory Visit: Payer: Self-pay

## 2021-12-26 ENCOUNTER — Other Ambulatory Visit: Payer: Self-pay

## 2021-12-26 ENCOUNTER — Ambulatory Visit: Payer: Medicaid Other | Admitting: Gerontology

## 2021-12-26 ENCOUNTER — Encounter: Payer: Self-pay | Admitting: Gerontology

## 2021-12-26 VITALS — BP 144/91 | HR 85 | Temp 98.3°F | Resp 16 | Ht 72.0 in | Wt 248.9 lb

## 2021-12-26 DIAGNOSIS — M79604 Pain in right leg: Secondary | ICD-10-CM

## 2021-12-26 DIAGNOSIS — F17200 Nicotine dependence, unspecified, uncomplicated: Secondary | ICD-10-CM

## 2021-12-26 NOTE — Progress Notes (Signed)
Established Patient Office Visit  Subjective   Patient ID: Nathan Vasquez, male    DOB: 08-Dec-1980  Age: 41 y.o. MRN: [id number]  Chief Complaint  Patient presents with   Follow-up    Follow up on leg pain; breathing has been a little off, he said he has been wheezing bad    HPI Nathan Vasquez is a 41 y/o male, who has history of Asthma, COPD, Hepatitis C, Heroin use , and History of balanitis presents for routine follow up. He was seen by Dr Jefm Bryant on 12/18/21 for right leg pain, encouraged support stockings, and gabapentin 100 mg tid. Currently, he states that his leg pain has improved to 70% with taking gabapentin. He states that he's been wheezing, but, he continues to smokes 1 pack of cigarette in 2 days and admits the desire to quit. He denies chest pain, palpitation, lightheadedness and shortness of breath.  Overall, he states that he is doing well and offers no further complaint.   Review of Systems  Constitutional: Negative.   Respiratory: Negative.    Cardiovascular: Negative.   Neurological: Negative.   Psychiatric/Behavioral: Negative.        Objective:     BP (!) 144/91 (BP Location: Right Arm, Patient Position: Sitting, Cuff Size: Small)   Pulse 85   Temp 98.3 F (36.8 C) (Oral)   Resp 16   Ht 6' (1.829 m)   Wt 248 lb 14.4 oz (112.9 kg)   SpO2 91%   BMI 33.76 kg/m  BP Readings from Last 3 Encounters:  12/26/21 (!) 144/91  12/18/21 128/90  12/11/21 (!) 142/96   Wt Readings from Last 3 Encounters:  12/26/21 248 lb 14.4 oz (112.9 kg)  12/18/21 244 lb 1.6 oz (110.7 kg)  12/11/21 248 lb 9.6 oz (112.8 kg)    Encouraged weight loss  Physical Exam HENT:     Head: Normocephalic and atraumatic.     Mouth/Throat:     Mouth: Mucous membranes are moist.  Cardiovascular:     Rate and Rhythm: Normal rate and regular rhythm.     Pulses: Normal pulses.     Heart sounds: Normal heart sounds.  Pulmonary:     Effort: Pulmonary effort is normal.      Breath sounds: Normal breath sounds.  Skin:    General: Skin is warm.  Neurological:     General: No focal deficit present.     Mental Status: He is alert and oriented to person, place, and time. Mental status is at baseline.  Psychiatric:        Mood and Affect: Mood normal.        Behavior: Behavior normal.        Thought Content: Thought content normal.        Judgment: Judgment normal.      No results found for any visits on 12/26/21.  Last CBC Lab Results  Component Value Date   WBC 7.0 06/28/2021   HGB 15.1 06/28/2021   HCT 44.7 06/28/2021   MCV 93.5 06/28/2021   MCH 31.6 06/28/2021   RDW 12.2 06/28/2021   PLT 187 46/56/8127   Last metabolic panel Lab Results  Component Value Date   GLUCOSE 93 07/31/2021   NA 138 07/31/2021   K 4.6 07/31/2021   CL 100 07/31/2021   CO2 26 07/31/2021   BUN 13 07/31/2021   CREATININE 0.95 07/31/2021   EGFR 104 07/31/2021   CALCIUM 9.5 07/31/2021   PROT 6.9 07/31/2021  ALBUMIN 4.4 07/31/2021   LABGLOB 2.5 07/31/2021   AGRATIO 1.8 07/31/2021   BILITOT 0.4 07/31/2021   ALKPHOS 111 07/31/2021   AST 13 07/31/2021   ALT 23 07/31/2021   ANIONGAP 8 01/16/2019   Last lipids Lab Results  Component Value Date   CHOL 171 07/31/2021   HDL 77 07/31/2021   LDLCALC 70 07/31/2021   TRIG 139 07/31/2021   CHOLHDL 2.2 07/31/2021   Last hemoglobin A1c Lab Results  Component Value Date   HGBA1C 4.8 07/31/2021      The 10-year ASCVD risk score (Arnett DK, et al., 2019) is: 2.1%    Assessment & Plan:   1. Right leg pain -He states that the pain to his right leg is improving and he will continue current medication.  He was advised to notify clinic for worsening symptoms.  2. Smoking -He was encouraged on smoking cessation and was provided with [place] quit line information.   Return in about 3 months (around 03/28/2022), or if symptoms worsen or fail to improve.    Nevelyn Mellott Jerold Coombe, NP

## 2021-12-26 NOTE — Patient Instructions (Signed)
Smoking Tobacco Information, Adult Smoking tobacco can be harmful to your health. Tobacco contains a toxic colorless chemical called nicotine. Nicotine causes changes in your brain that make you want more and more. This is called addiction. This can make it hard to stop smoking once you start. Tobacco also has other toxic chemicals that can hurt your body and raise your risk of many cancers. Menthol or "lite" tobacco or cigarette brands are not safer than regular brands. How can smoking tobacco affect me? Smoking tobacco puts you at risk for: Cancer. Smoking is most commonly associated with lung cancer, but can also lead to cancer in other parts of the body. Chronic obstructive pulmonary disease (COPD). This is a long-term lung condition that makes it hard to breathe. It also gets worse over time. High blood pressure (hypertension), heart disease, stroke, heart attack, and lung infections, such as pneumonia. Cataracts. This is when the lenses in the eyes become clouded. Digestive problems. This may include peptic ulcers, heartburn, and gastroesophageal reflux disease (GERD). Oral health problems, such as gum disease, mouth sores, and tooth loss. Loss of taste and smell. Smoking also affects how you look and smell. Smoking may cause: Wrinkles. Yellow or stained teeth, fingers, and fingernails. Bad breath. Bad-smelling clothes and hair. Smoking tobacco can also affect your social life, because: It may be challenging to find places to smoke when away from home. Many workplaces, restaurants, hotels, and public places are tobacco-free. Smoking is expensive. This is due to the cost of tobacco and the long-term costs of treating health problems from smoking. Secondhand smoke may affect those around you. Secondhand smoke can cause lung cancer, breathing problems, and heart disease. Children of smokers have a higher risk for: Sudden infant death syndrome (SIDS). Ear infections. Lung infections. What  actions can I take to prevent health problems? Quit smoking  Do not start smoking. Quit if you already smoke. Do not replace cigarette smoking with vaping devices, such as e-cigarettes. Make a plan to quit smoking and commit to it. Look for programs to help you, and ask your health care provider for recommendations and ideas. Set a date and write down all the reasons you want to quit. Let your friends and family know you are quitting so they can help and support you. Consider finding friends who also want to quit. It can be easier to quit with someone else, so that you can support each other. Talk with your health care provider about using nicotine replacement medicines to help you quit. These include gum, lozenges, patches, sprays, or pills. If you try to quit but return to smoking, stay positive. It is common to slip up when you first quit, so take it one day at a time. Be prepared for cravings. When you feel the urge to smoke, chew gum or suck on hard candy. Lifestyle Stay busy. Take care of your body. Get plenty of exercise, eat a healthy diet, and drink plenty of water. Find ways to manage your stress, such as meditation, yoga, exercise, or time spent with friends and family. Ask your health care provider about having regular tests (screenings) to check for cancer. This may include blood tests, imaging tests, and other tests. Where to find support To get support to quit smoking, consider: Asking your health care provider for more information and resources. Joining a support group for people who want to quit smoking in your local community. There are many effective programs that may help you to quit. Calling the smokefree.gov counselor   helpline at 1-800-QUIT-NOW 970-196-4616). Where to find more information You may find more information about quitting smoking from: Centers for Disease Control and Prevention: https://www.chang-huffman.com/ https://hall.com/: smokefree.gov American Lung Association:  freedomfromsmoking.org Contact a health care provider if: You have problems breathing. Your lips, nose, or fingers turn blue. You have chest pain. You are coughing up blood. You feel like you will faint. You have other health changes that cause you to worry. Summary Smoking tobacco can negatively affect your health, the health of those around you, your finances, and your social life. Do not start smoking. Quit if you already smoke. If you need help quitting, ask your health care provider. Consider joining a support group for people in your local community who want to quit smoking. There are many effective programs that may help you to quit. This information is not intended to replace advice given to you by your health care provider. Make sure you discuss any questions you have with your health care provider. Document Revised: 05/08/2021 Document Reviewed: 05/08/2021 Elsevier Patient Education  [place] Eating Plan DASH stands for Dietary Approaches to Stop Hypertension. The DASH eating plan is a healthy eating plan that has been shown to: Reduce high blood pressure (hypertension). Reduce your risk for type 2 diabetes, heart disease, and stroke. Help with weight loss. What are tips for following this plan? Reading food labels Check food labels for the amount of salt (sodium) per serving. Choose foods with less than 5 percent of the Daily Value of sodium. Generally, foods with less than 300 milligrams (mg) of sodium per serving fit into this eating plan. To find whole grains, look for the word "whole" as the first word in the ingredient list. Shopping Buy products labeled as "low-sodium" or "no salt added." Buy fresh foods. Avoid canned foods and pre-made or frozen meals. Cooking Avoid adding salt when cooking. Use salt-free seasonings or herbs instead of table salt or sea salt. Check with your health care provider or pharmacist before using salt substitutes. Do not fry  foods. Cook foods using healthy methods such as baking, boiling, grilling, roasting, and broiling instead. Cook with heart-healthy oils, such as olive, canola, avocado, soybean, or sunflower oil. Meal planning  Eat a balanced diet that includes: 4 or more servings of fruits and 4 or more servings of vegetables each day. Try to fill one-half of your plate with fruits and vegetables. 6-8 servings of whole grains each day. Less than 6 oz (170 g) of lean meat, poultry, or fish each day. A 3-oz (85-g) serving of meat is about the same size as a deck of cards. One egg equals 1 oz (28 g). 2-3 servings of low-fat dairy each day. One serving is 1 cup (237 mL). 1 serving of nuts, seeds, or beans 5 times each week. 2-3 servings of heart-healthy fats. Healthy fats called omega-3 fatty acids are found in foods such as walnuts, flaxseeds, fortified milks, and eggs. These fats are also found in cold-water fish, such as sardines, salmon, and mackerel. Limit how much you eat of: Canned or prepackaged foods. Food that is high in trans fat, such as some fried foods. Food that is high in saturated fat, such as fatty meat. Desserts and other sweets, sugary drinks, and other foods with added sugar. Full-fat dairy products. Do not salt foods before eating. Do not eat more than 4 egg yolks a week. Try to eat at least 2 vegetarian meals a week. Eat more home-cooked food and less restaurant,  buffet, and fast food. Lifestyle When eating at a restaurant, ask that your food be prepared with less salt or no salt, if possible. If you drink alcohol: Limit how much you use to: 0-1 drink a day for women who are not pregnant. 0-2 drinks a day for men. Be aware of how much alcohol is in your drink. In the [place] one drink equals one 12 oz bottle of beer (355 mL), one 5 oz glass of wine (148 mL), or one 1 oz glass of hard liquor (44 mL). General information Avoid eating more than 2,300 mg of salt a day. If you have  hypertension, you may need to reduce your sodium intake to 1,500 mg a day. Work with your health care provider to maintain a healthy body weight or to lose weight. Ask what an ideal weight is for you. Get at least 30 minutes of exercise that causes your heart to beat faster (aerobic exercise) most days of the week. Activities may include walking, swimming, or biking. Work with your health care provider or dietitian to adjust your eating plan to your individual calorie needs. What foods should I eat? Fruits All fresh, dried, or frozen fruit. Canned fruit in natural juice (without added sugar). Vegetables Fresh or frozen vegetables (raw, steamed, roasted, or grilled). Low-sodium or reduced-sodium tomato and vegetable juice. Low-sodium or reduced-sodium tomato sauce and tomato paste. Low-sodium or reduced-sodium canned vegetables. Grains Whole-grain or whole-wheat bread. Whole-grain or whole-wheat pasta. Brown rice. [place]. Bulgur. Whole-grain and low-sodium cereals. Pita bread. Low-fat, low-sodium crackers. Whole-wheat flour tortillas. Meats and other proteins Skinless chicken or [place]. Ground chicken or [place]. Pork with fat trimmed off. Fish and seafood. Egg whites. Dried beans, peas, or lentils. Unsalted nuts, nut butters, and seeds. Unsalted canned beans. Lean cuts of beef with fat trimmed off. Low-sodium, lean precooked or cured meat, such as sausages or meat loaves. Dairy Low-fat (1%) or fat-free (skim) milk. Reduced-fat, low-fat, or fat-free cheeses. Nonfat, low-sodium ricotta or cottage cheese. Low-fat or nonfat yogurt. Low-fat, low-sodium cheese. Fats and oils Soft margarine without trans fats. Vegetable oil. Reduced-fat, low-fat, or light mayonnaise and salad dressings (reduced-sodium). Canola, safflower, olive, avocado, soybean, and sunflower oils. Avocado. Seasonings and condiments Herbs. Spices. Seasoning mixes without salt. Other foods Unsalted popcorn and pretzels. Fat-free  sweets. The items listed above may not be a complete list of foods and beverages you can eat. Contact a dietitian for more information. What foods should I avoid? Fruits Canned fruit in a light or heavy syrup. Fried fruit. Fruit in cream or butter sauce. Vegetables Creamed or fried vegetables. Vegetables in a cheese sauce. Regular canned vegetables (not low-sodium or reduced-sodium). Regular canned tomato sauce and paste (not low-sodium or reduced-sodium). Regular tomato and vegetable juice (not low-sodium or reduced-sodium). Angie Fava. Olives. Grains Baked goods made with fat, such as croissants, muffins, or some breads. Dry pasta or rice meal packs. Meats and other proteins Fatty cuts of meat. Ribs. Fried meat. Berniece Salines. Bologna, salami, and other precooked or cured meats, such as sausages or meat loaves. Fat from the back of a pig (fatback). Bratwurst. Salted nuts and seeds. Canned beans with added salt. Canned or smoked fish. Whole eggs or egg yolks. Chicken or [place] with skin. Dairy Whole or 2% milk, cream, and half-and-half. Whole or full-fat cream cheese. Whole-fat or sweetened yogurt. Full-fat cheese. Nondairy creamers. Whipped toppings. Processed cheese and cheese spreads. Fats and oils Butter. Stick margarine. Lard. Shortening. Ghee. Bacon fat. Tropical oils, such as coconut, palm  kernel, or palm oil. Seasonings and condiments Onion salt, garlic salt, seasoned salt, table salt, and sea salt. Worcestershire sauce. Tartar sauce. Barbecue sauce. Teriyaki sauce. Soy sauce, including reduced-sodium. Steak sauce. Canned and packaged gravies. Fish sauce. Oyster sauce. Cocktail sauce. Store-bought horseradish. Ketchup. Mustard. Meat flavorings and tenderizers. Bouillon cubes. Hot sauces. Pre-made or packaged marinades. Pre-made or packaged taco seasonings. Relishes. Regular salad dressings. Other foods Salted popcorn and pretzels. The items listed above may not be a complete list of foods and  beverages you should avoid. Contact a dietitian for more information. Where to find more information National Heart, Lung, and Blood Institute: www.nhlbi.nih.gov American Heart Association: www.heart.org Academy of Nutrition and Dietetics: www.eatright.org National Kidney Foundation: www.kidney.org Summary The DASH eating plan is a healthy eating plan that has been shown to reduce high blood pressure (hypertension). It may also reduce your risk for type 2 diabetes, heart disease, and stroke. When on the DASH eating plan, aim to eat more fresh fruits and vegetables, whole grains, lean proteins, low-fat dairy, and heart-healthy fats. With the DASH eating plan, you should limit salt (sodium) intake to 2,300 mg a day. If you have hypertension, you may need to reduce your sodium intake to 1,500 mg a day. Work with your health care provider or dietitian to adjust your eating plan to your individual calorie needs. This information is not intended to replace advice given to you by your health care provider. Make sure you discuss any questions you have with your health care provider. Document Revised: 04/16/2019 Document Reviewed: 04/16/2019 Elsevier Patient Education  2023 Elsevier Inc.  

## 2021-12-31 ENCOUNTER — Ambulatory Visit (INDEPENDENT_AMBULATORY_CARE_PROVIDER_SITE_OTHER): Payer: Self-pay | Admitting: Family

## 2021-12-31 ENCOUNTER — Other Ambulatory Visit: Payer: Self-pay

## 2021-12-31 ENCOUNTER — Encounter: Payer: Self-pay | Admitting: Family

## 2021-12-31 VITALS — BP 159/95 | HR 70 | Temp 98.2°F | Ht 72.0 in | Wt 251.0 lb

## 2021-12-31 DIAGNOSIS — B182 Chronic viral hepatitis C: Secondary | ICD-10-CM

## 2021-12-31 NOTE — Progress Notes (Signed)
Subjective:    Patient ID: Nathan Vasquez, male    DOB: 09-14-1980, 41 y.o.   MRN: [id number]  Chief Complaint  Patient presents with   Follow-up   Hepatitis C    HPI:  Nathan Vasquez is a 41 y.o. male with chronic Hepatitis C last seen on 09/25/21 by Margarite Gouge, PharmD, CPP with good adherence and tolerance to Mavryet. Hepatitis C RNA level was undetectable. Here today for cure visit.   Mr. Nathan Vasquez has been doing well since his last office and is currently without symptoms. Denies abdominal pain, nausea, vomiting, diarrhea, fatigue, scleral icterus or jaundice. No new concerns/complaints.    Allergies  Allergen Reactions   Penicillins Rash      Outpatient Medications Prior to Visit  Medication Sig Dispense Refill   acetaminophen (TYLENOL) 500 MG tablet Take 1,000 mg by mouth every 6 (six) hours as needed for mild pain.     albuterol (PROVENTIL HFA) 108 (90 Base) MCG/ACT inhaler Inhale 1-2 puffs into the lungs once every 6 (six) hours as needed for wheezing or shortness of breath. 6.7 g 3   amLODipine (NORVASC) 10 MG tablet Take 1 tablet (10 mg total) by mouth once daily. 90 tablet 1   fluticasone furoate-vilanterol (BREO ELLIPTA) 200-25 MCG/ACT AEPB Inhale 1 puff into the lungs once daily. 60 each 4   gabapentin (NEURONTIN) 100 MG capsule Take 1 capsule (100 mg total) by mouth 3 (three) times daily. 90 capsule 0   meloxicam (MOBIC) 7.5 MG tablet Take 1 tablet (7.5 mg total) by mouth once daily. 30 tablet 0   montelukast (SINGULAIR) 10 MG tablet Take 1 tablet (10 mg total) by mouth once nightly at bedtime. 90 tablet 0   omeprazole (PRILOSEC) 20 MG capsule Take 1 capsule (20 mg total) by mouth once daily. 30 capsule 2   No facility-administered medications prior to visit.     Past Medical History:  Diagnosis Date   Asthma    COPD (chronic obstructive pulmonary disease) (HCC)    Hep C w/o coma, chronic (HCC)    Heroin abuse (HCC)    History of balanitis     Hypertension      Past Surgical History:  Procedure Laterality Date   TOOTH EXTRACTION         Review of Systems  Constitutional:  Negative for chills, diaphoresis, fatigue and fever.  Respiratory:  Negative for cough, chest tightness, shortness of breath and wheezing.   Cardiovascular:  Negative for chest pain.  Gastrointestinal:  Negative for abdominal distention, abdominal pain, constipation, diarrhea, nausea and vomiting.  Neurological:  Negative for weakness and headaches.  Hematological:  Does not bruise/bleed easily.      Objective:    BP (!) 159/95 Comment: Provider notified  Pulse 70   Temp 98.2 F (36.8 C) (Oral)   Ht 6' (1.829 m)   Wt 251 lb (113.9 kg)   SpO2 96%   BMI 34.04 kg/m  Nursing note and vital signs reviewed.  Physical Exam Constitutional:      General: He is not in acute distress.    Appearance: He is well-developed.  Cardiovascular:     Rate and Rhythm: Normal rate and regular rhythm.     Heart sounds: Normal heart sounds. No murmur heard.    No friction rub. No gallop.  Pulmonary:     Effort: Pulmonary effort is normal. No respiratory distress.     Breath sounds: Normal breath sounds. No wheezing or rales.  Chest:     Chest wall: No tenderness.  Abdominal:     General: Bowel sounds are normal. There is no distension.     Palpations: Abdomen is soft. There is no mass.     Tenderness: There is no abdominal tenderness. There is no guarding or rebound.  Skin:    General: Skin is warm and dry.  Neurological:     Mental Status: He is alert and oriented to person, place, and time.  Psychiatric:        Behavior: Behavior normal.        Thought Content: Thought content normal.        Judgment: Judgment normal.         12/31/2021    2:31 PM 08/16/2021    3:38 PM 08/08/2021    2:16 PM 07/31/2021    3:00 PM 06/28/2021    2:42 PM  Depression screen PHQ 2/9  Decreased Interest 1 3 3 3  0  Down, Depressed, Hopeless 2 2 2 3 1   PHQ - 2 Score 3  5 5 6 1   Altered sleeping 3 3 3 3    Tired, decreased energy 3 3 3 3    Change in appetite 1 2 2 2    Feeling bad or failure about yourself  0 2 2 3    Trouble concentrating 1 2 2  0   Moving slowly or fidgety/restless 0 2 2 0   Suicidal thoughts 0 0 0 0   PHQ-9 Score 11 19 19 17    Difficult doing work/chores Very difficult Very difficult Somewhat difficult Very difficult        Assessment & Plan:    Patient Active Problem List   Diagnosis Date Noted   Right leg pain 12/11/2021   Cough 09/11/2021   Shortness of breath 08/14/2021   History of gastroesophageal reflux (GERD) 08/14/2021   Chronic pain of right knee 08/14/2021   Wheezing 08/02/2021   Essential hypertension 07/31/2021   Encounter to establish care 07/31/2021   Blurry vision 07/31/2021   Chronic hepatitis C without hepatic coma (HCC) 06/28/2021   Asthma 10/28/2019   Left knee pain 10/28/2019   Smoking 10/28/2019   Generalized anxiety disorder 09/28/2019   Complaint of debility and malaise 08/04/2016   Mild intermittent asthma 08/04/2016   Opiate overdose (HCC) 08/04/2016     Problem List Items Addressed This Visit       Digestive   Chronic hepatitis C without hepatic coma (HCC) - Primary    [name] has completed 8 weeks of Hepatitis C treatment with Mavyret and will check Hepatitis C RNA level today for sustained viremic response. If negative, no additional treatment is needed. Reviewed that if screening is needed in the future will need to check Hepatitis C RNA level as Hepatitis C antibody will always be positive. Low risk of fibrosis and no additional screening for [facility] indicated. Follow up with ID as needed pending lab work results.       Relevant Orders   Hepatitis C RNA quantitative     I am having 12/28/2019 Vasquez maintain his acetaminophen, meloxicam, amLODipine, omeprazole, montelukast, fluticasone furoate-vilanterol, albuterol, and gabapentin.   Follow-up: As needed pending lab work  results.    12/28/2019, MSN, FNP-C Nurse Practitioner [facility] Main number: [phone]

## 2021-12-31 NOTE — Assessment & Plan Note (Signed)
Mr. Hettich has completed 8 weeks of Hepatitis C treatment with Mavyret and will check Hepatitis C RNA level today for sustained viremic response. If negative, no additional treatment is needed. Reviewed that if screening is needed in the future will need to check Hepatitis C RNA level as Hepatitis C antibody will always be positive. Low risk of fibrosis and no additional screening for [facility] indicated. Follow up with ID as needed pending lab work results.

## 2021-12-31 NOTE — Patient Instructions (Addendum)
Nice to see you.  We will check your lab work today.   [facility], [place] [address], [address], [place], [place] [phone] [facility], [address] [place], [place], [phone] [facility], [address], [address] [place], [place] [phone] [facility] (many locations): [email or web address]  If your viral load is <15 you are cured.   No future liver cancer screening is needed.   If future screening for Hepatitis C is needed they will need to check Hepatitis C RNA level or viral load as Hepatitis C antibody will always be positive.  Follow up as needed pending lab work results.

## 2022-01-02 ENCOUNTER — Other Ambulatory Visit: Payer: Self-pay

## 2022-01-02 LAB — HEPATITIS C RNA QUANTITATIVE
HCV Quantitative Log: <1.18 log IU/mL
HCV RNA, PCR, QN: <15 IU/mL

## 2022-01-07 ENCOUNTER — Other Ambulatory Visit: Payer: Self-pay

## 2022-01-08 ENCOUNTER — Ambulatory Visit: Payer: Medicaid Other | Admitting: Rheumatology

## 2022-01-11 ENCOUNTER — Other Ambulatory Visit: Payer: Self-pay

## 2022-01-13 ENCOUNTER — Other Ambulatory Visit: Payer: Self-pay

## 2022-01-13 ENCOUNTER — Other Ambulatory Visit: Payer: Self-pay | Admitting: Gerontology

## 2022-01-15 ENCOUNTER — Other Ambulatory Visit: Payer: Self-pay

## 2022-01-15 MED FILL — Gabapentin Cap 100 MG: ORAL | 30 days supply | Qty: 90 | Fill #0 | Status: CN

## 2022-01-16 ENCOUNTER — Other Ambulatory Visit: Payer: Self-pay

## 2022-01-16 MED FILL — Gabapentin Cap 100 MG: ORAL | 30 days supply | Qty: 90 | Fill #0 | Status: AC

## 2022-01-22 ENCOUNTER — Other Ambulatory Visit: Payer: Self-pay

## 2022-01-25 ENCOUNTER — Other Ambulatory Visit: Payer: Self-pay

## 2022-01-30 ENCOUNTER — Other Ambulatory Visit: Payer: Self-pay | Admitting: Gerontology

## 2022-01-30 DIAGNOSIS — G8929 Other chronic pain: Secondary | ICD-10-CM

## 2022-01-30 DIAGNOSIS — M79604 Pain in right leg: Secondary | ICD-10-CM

## 2022-01-31 ENCOUNTER — Other Ambulatory Visit: Payer: Self-pay

## 2022-01-31 MED ORDER — MELOXICAM 7.5 MG PO TABS
7.5000 mg | ORAL_TABLET | Freq: Every day | ORAL | 0 refills | Status: DC
  Filled 2022-01-31: qty 30, 30d supply, fill #0

## 2022-02-12 ENCOUNTER — Ambulatory Visit: Payer: Medicaid Other | Admitting: Gerontology

## 2022-02-19 ENCOUNTER — Other Ambulatory Visit: Payer: Self-pay

## 2022-02-19 ENCOUNTER — Other Ambulatory Visit: Payer: Self-pay | Admitting: Gerontology

## 2022-02-20 ENCOUNTER — Other Ambulatory Visit: Payer: Self-pay

## 2022-02-20 MED FILL — Gabapentin Cap 100 MG: ORAL | 30 days supply | Qty: 90 | Fill #0 | Status: AC

## 2022-02-21 ENCOUNTER — Other Ambulatory Visit: Payer: Self-pay

## 2022-02-26 ENCOUNTER — Other Ambulatory Visit: Payer: Self-pay

## 2022-03-06 ENCOUNTER — Other Ambulatory Visit: Payer: Self-pay

## 2022-03-06 ENCOUNTER — Other Ambulatory Visit: Payer: Self-pay | Admitting: Gerontology

## 2022-03-06 DIAGNOSIS — J4530 Mild persistent asthma, uncomplicated: Secondary | ICD-10-CM

## 2022-03-06 DIAGNOSIS — M79604 Pain in right leg: Secondary | ICD-10-CM

## 2022-03-06 DIAGNOSIS — B182 Chronic viral hepatitis C: Secondary | ICD-10-CM

## 2022-03-06 DIAGNOSIS — G8929 Other chronic pain: Secondary | ICD-10-CM

## 2022-03-06 MED ORDER — MONTELUKAST SODIUM 10 MG PO TABS
10.0000 mg | ORAL_TABLET | Freq: Every day | ORAL | 0 refills | Status: DC
  Filled 2022-03-06 – 2022-03-29 (×2): qty 90, 90d supply, fill #0

## 2022-03-06 MED ORDER — ALBUTEROL SULFATE HFA 108 (90 BASE) MCG/ACT IN AERS
1.0000 | INHALATION_SPRAY | Freq: Four times a day (QID) | RESPIRATORY_TRACT | 3 refills | Status: DC | PRN
  Filled 2022-03-06 – 2022-03-08 (×2): qty 6.7, 25d supply, fill #0
  Filled 2022-03-28 – 2022-03-29 (×2): qty 6.7, 25d supply, fill #1
  Filled 2022-04-19 – 2022-04-30 (×2): qty 6.7, 25d supply, fill #2
  Filled 2022-05-30: qty 6.7, 25d supply, fill #3

## 2022-03-08 ENCOUNTER — Other Ambulatory Visit: Payer: Self-pay | Admitting: Gerontology

## 2022-03-08 DIAGNOSIS — M79604 Pain in right leg: Secondary | ICD-10-CM

## 2022-03-08 DIAGNOSIS — G8929 Other chronic pain: Secondary | ICD-10-CM

## 2022-03-10 ENCOUNTER — Other Ambulatory Visit: Payer: Self-pay

## 2022-03-10 ENCOUNTER — Other Ambulatory Visit: Payer: Self-pay | Admitting: Gerontology

## 2022-03-10 DIAGNOSIS — G8929 Other chronic pain: Secondary | ICD-10-CM

## 2022-03-10 DIAGNOSIS — M79604 Pain in right leg: Secondary | ICD-10-CM

## 2022-03-11 ENCOUNTER — Other Ambulatory Visit: Payer: Self-pay

## 2022-03-11 MED FILL — Meloxicam Tab 7.5 MG: ORAL | 30 days supply | Qty: 30 | Fill #0 | Status: AC

## 2022-03-12 ENCOUNTER — Other Ambulatory Visit: Payer: Self-pay

## 2022-03-13 ENCOUNTER — Other Ambulatory Visit: Payer: Self-pay

## 2022-03-22 ENCOUNTER — Other Ambulatory Visit: Payer: Self-pay | Admitting: Gerontology

## 2022-03-25 ENCOUNTER — Other Ambulatory Visit: Payer: Self-pay

## 2022-03-26 ENCOUNTER — Other Ambulatory Visit: Payer: Self-pay

## 2022-03-26 ENCOUNTER — Other Ambulatory Visit: Payer: Self-pay | Admitting: Gerontology

## 2022-03-26 MED FILL — Gabapentin Cap 100 MG: ORAL | 30 days supply | Qty: 90 | Fill #0 | Status: AC

## 2022-03-27 ENCOUNTER — Ambulatory Visit: Payer: Medicaid Other | Admitting: Gerontology

## 2022-03-28 ENCOUNTER — Other Ambulatory Visit: Payer: Self-pay

## 2022-03-29 ENCOUNTER — Other Ambulatory Visit: Payer: Self-pay

## 2022-04-02 ENCOUNTER — Other Ambulatory Visit: Payer: Self-pay

## 2022-04-02 ENCOUNTER — Ambulatory Visit: Payer: Medicaid Other | Admitting: Rheumatology

## 2022-04-02 ENCOUNTER — Encounter: Payer: Self-pay | Admitting: Rheumatology

## 2022-04-02 DIAGNOSIS — G8929 Other chronic pain: Secondary | ICD-10-CM

## 2022-04-02 DIAGNOSIS — M79604 Pain in right leg: Secondary | ICD-10-CM

## 2022-04-02 MED ORDER — MELOXICAM 7.5 MG PO TABS
7.5000 mg | ORAL_TABLET | Freq: Every day | ORAL | 0 refills | Status: DC
  Filled 2022-04-02 – 2022-04-30 (×3): qty 30, 30d supply, fill #0

## 2022-04-02 NOTE — Progress Notes (Signed)
[facility]  PROGRESS NOTE  Patient:Nathan Vasquez Male   DOB:[birth year]     41 y.o.  DDU:[id number]  Visit Date: 04/02/2022  HPI:  Follow-up knee pain.  Improvement.  Less neuralgia.  Only takes gabapentin once a day because it makes him feel drunk Would like refill meloxicam for sparing use.  Prior GI reflux.  Resolved hepatitis C without liver damage.  Hypertension     Past Medical History:  Diagnosis Date   Asthma    COPD (chronic obstructive pulmonary disease) (HCC)    Hep C w/o coma, chronic (HCC)    Heroin abuse ([place])    History of balanitis    Hypertension     Past Surgical History:  Procedure Laterality Date   TOOTH EXTRACTION      Social History   Tobacco Use   Smoking status: Every Day    Packs/day: 0.50    Years: 25.00    Total pack years: 12.50    Types: Cigarettes   Smokeless tobacco: Current   Tobacco comments:    Patient has cut down to 1/2 - 1 ppd. Gave [place] Quit Info.  Substance Use Topics   Alcohol use: Yes    Alcohol/week: 1.0 standard drink of alcohol    Types: 1 Cans of beer per week    Comment: seldom, once every 3-6 months     MEDICATIONS: Current Outpatient Medications  Medication Sig Dispense Refill   albuterol (PROVENTIL HFA) 108 (90 Base) MCG/ACT inhaler Inhale 1-2 puffs into the lungs once every 6 (six) hours as needed for wheezing or shortness of breath. 6.7 g 3   amLODipine (NORVASC) 10 MG tablet Take 1 tablet (10 mg total) by mouth once daily. 90 tablet 1   fluticasone furoate-vilanterol (BREO ELLIPTA) 200-25 MCG/ACT AEPB Inhale 1 puff into the lungs once daily. 60 each 4   gabapentin (NEURONTIN) 100 MG capsule Take 1 capsule (100 mg total) by mouth 3 (three) times daily. 90 capsule 0   meloxicam (MOBIC) 7.5 MG tablet Take 1 tablet (7.5 mg total) by mouth once daily. 30 tablet 0   montelukast (SINGULAIR) 10 MG tablet Take 1 tablet (10 mg total) by mouth once nightly at bedtime. 90 tablet 0    omeprazole (PRILOSEC) 20 MG capsule Take 1 capsule (20 mg total) by mouth once daily. 30 capsule 2   No current facility-administered medications for this visit.     ALLERGIES Allergies  Allergen Reactions   Penicillins Rash     PHYSICAL EXAM: Blood pressure 112/69, pulse 88, temperature 98.3 F (36.8 C), temperature source Oral, resp. rate 17, height 6' (1.829 m), weight 234 lb 12.8 oz (106.5 kg), SpO2 91 %. Mild lateral joint line tenderness right knee.  No effusion.  Stable ligaments.  Left knee without synovitis mild osteoarthritis in the knee.  Prior hyperextension injury.  Improved neuropathy.  Cannot rule out lateral meniscal disease   ASSESSMENT: mild osteoarthritis in the knee.  Prior hyperextension injury.  Improved neuropathy.  Cannot rule out lateral meniscal disease    PLAN: Refill x1.  Sparing use not daily.  Discussed risk of nonsteroidals.  May use topicals in the future    G. Fayrene Fearing. MD           04/02/2022,  11:49 AM

## 2022-04-02 NOTE — Patient Instructions (Signed)
Sparing use of meloxicam. Not daily. Continue omeprazole

## 2022-04-04 ENCOUNTER — Other Ambulatory Visit: Payer: Self-pay

## 2022-04-04 ENCOUNTER — Ambulatory Visit: Payer: Medicaid Other | Admitting: Gerontology

## 2022-04-04 ENCOUNTER — Encounter: Payer: Self-pay | Admitting: Gerontology

## 2022-04-04 VITALS — BP 138/87 | HR 69 | Temp 98.0°F | Resp 16 | Ht 72.0 in | Wt 237.4 lb

## 2022-04-04 DIAGNOSIS — R0602 Shortness of breath: Secondary | ICD-10-CM

## 2022-04-04 DIAGNOSIS — R062 Wheezing: Secondary | ICD-10-CM

## 2022-04-04 MED ORDER — PREDNISONE 20 MG PO TABS
20.0000 mg | ORAL_TABLET | Freq: Every day | ORAL | 0 refills | Status: DC
  Filled 2022-04-04: qty 5, 5d supply, fill #0

## 2022-04-04 NOTE — Progress Notes (Signed)
Established Patient Office Visit  Subjective   Patient ID: Nathan Vasquez, male    DOB: Feb 07, 1981  Age: 41 y.o. MRN: [id number]  Chief Complaint  Patient presents with   Follow-up   Shortness of Breath    Patient c/o worsening shortness of breath with exertion. Patient has a nebulizer machine, but does not have the medicine.    Shortness of Breath Associated symptoms include sputum production and wheezing.   HPI  Nathan Vasquez is a 41 y/o male, who has history of Asthma, COPD, Hepatitis C, Heroin use, and History of balanitis who presents for routine follow up. He saw Dr. Jefm Bryant on 04/02/22 for right knee pain, which is suspected to be due to mild osteoarthritis and a prior hyperextension injury. He was given a refill of meloxicam to use sparingly, which he says relieves the pain. He reports worsening shortness of breath with exertion, such as going up and down stairs, for the past few months. When these episodes occur he has chest pain, tightness, dizziness, and wheezing. These episodes happen daily, which is causing him to use his albuterol daily. SpO2 92% today in clinic. He continues to smoke 0.5 PPD for 25 years = 12.5 pack years. He is interested in smoking cessation, as he has done it before. He did develop a cold a few weeks ago with a cough with productive yellowish brown phlegm. He states the cough and phlegm production are improving. He denies fever nor chills. He offers no further complaints.   Review of Systems  Constitutional: Negative.   Eyes: Negative.   Respiratory:  Positive for cough, sputum production, shortness of breath and wheezing.   Cardiovascular: Negative.   Gastrointestinal: Negative.   Neurological: Negative.   Endo/Heme/Allergies: Negative.   Psychiatric/Behavioral: Negative.        Objective:     There were no vitals taken for this visit. BP Readings from Last 3 Encounters:  04/04/22 (!) 158/77  04/02/22 112/69  12/31/21 (!)  159/95   Wt Readings from Last 3 Encounters:  04/04/22 237 lb 6.4 oz (107.7 kg)  04/02/22 234 lb 12.8 oz (106.5 kg)  12/31/21 251 lb (113.9 kg)      Physical Exam Constitutional:      Appearance: He is well-developed. He is obese.  Pulmonary:     Effort: Pulmonary effort is normal.     Breath sounds: Examination of the right-upper field reveals wheezing. Examination of the left-upper field reveals wheezing. Examination of the right-lower field reveals decreased breath sounds. Examination of the left-lower field reveals decreased breath sounds. Decreased breath sounds and wheezing present.  Skin:    General: Skin is warm and dry.     Capillary Refill: Capillary refill takes less than 2 seconds.  Neurological:     General: No focal deficit present.     Mental Status: He is alert and oriented to person, place, and time. Mental status is at baseline.  Psychiatric:        Mood and Affect: Mood normal.        Behavior: Behavior normal.        Thought Content: Thought content normal.        Judgment: Judgment normal.      No results found for any visits on 04/04/22.  Last CBC Lab Results  Component Value Date   WBC 7.0 06/28/2021   HGB 15.1 06/28/2021   HCT 44.7 06/28/2021   MCV 93.5 06/28/2021   MCH 31.6 06/28/2021  RDW 12.2 06/28/2021   PLT 187 68/15/9470   Last metabolic panel Lab Results  Component Value Date   GLUCOSE 93 07/31/2021   NA 138 07/31/2021   K 4.6 07/31/2021   CL 100 07/31/2021   CO2 26 07/31/2021   BUN 13 07/31/2021   CREATININE 0.95 07/31/2021   EGFR 104 07/31/2021   CALCIUM 9.5 07/31/2021   PROT 6.9 07/31/2021   ALBUMIN 4.4 07/31/2021   LABGLOB 2.5 07/31/2021   AGRATIO 1.8 07/31/2021   BILITOT 0.4 07/31/2021   ALKPHOS 111 07/31/2021   AST 13 07/31/2021   ALT 23 07/31/2021   ANIONGAP 8 01/16/2019   Last lipids Lab Results  Component Value Date   CHOL 171 07/31/2021   HDL 77 07/31/2021   LDLCALC 70 07/31/2021   TRIG 139 07/31/2021    CHOLHDL 2.2 07/31/2021   Last hemoglobin A1c Lab Results  Component Value Date   HGBA1C 4.8 07/31/2021      The 10-year ASCVD risk score (Arnett DK, et al., 2019) is: 1.3%    Assessment & Plan:   1. Shortness of breath - Likely Asthma flare, his breathing is not stable and was started on prednisone. He was educated on medication side effects and advised to notify clinic. He will continue to use albuterol as needed along with daily singulair and breo-ellipta. He was advised to go to the ED for worsening shortness of breath or symptoms. Chest x-ray ordered. Referral to pulmonology. Encouraged smoking cessation, [place] Qutline information given.  - predniSONE (DELTASONE) 20 MG tablet; Take 1 tablet (20 mg total) by mouth daily with breakfast.  Dispense: 5 tablet; Refill: 0 - DG Chest 2 View; Future - Ambulatory referral to Pulmonology  2. Wheezing - As mentioned, his breathing is not under control. PRN albuterol and daily singulair and breo-ellipta. Report to the ED for worsening shortness of breath or symptoms.  - predniSONE (DELTASONE) 20 MG tablet; Take 1 tablet (20 mg total) by mouth daily with breakfast.  Dispense: 5 tablet; Refill: 0 - DG Chest 2 View; Future - Ambulatory referral to Pulmonology   Follow-up in one week, around 04/10/22   Rayvon Char, FNP Student

## 2022-04-04 NOTE — Patient Instructions (Signed)
Smoking Tobacco Information, Adult Smoking tobacco can be harmful to your health. Tobacco contains a toxic colorless chemical called nicotine. Nicotine causes changes in your brain that make you want more and more. This is called addiction. This can make it hard to stop smoking once you start. Tobacco also has other toxic chemicals that can hurt your body and raise your risk of many cancers. Menthol or "lite" tobacco or cigarette brands are not safer than regular brands. How can smoking tobacco affect me? Smoking tobacco puts you at risk for: Cancer. Smoking is most commonly associated with lung cancer, but can also lead to cancer in other parts of the body. Chronic obstructive pulmonary disease (COPD). This is a long-term lung condition that makes it hard to breathe. It also gets worse over time. High blood pressure (hypertension), heart disease, stroke, heart attack, and lung infections, such as pneumonia. Cataracts. This is when the lenses in the eyes become clouded. Digestive problems. This may include peptic ulcers, heartburn, and gastroesophageal reflux disease (GERD). Oral health problems, such as gum disease, mouth sores, and tooth loss. Loss of taste and smell. Smoking also affects how you look and smell. Smoking may cause: Wrinkles. Yellow or stained teeth, fingers, and fingernails. Bad breath. Bad-smelling clothes and hair. Smoking tobacco can also affect your social life, because: It may be challenging to find places to smoke when away from home. Many workplaces, restaurants, hotels, and public places are tobacco-free. Smoking is expensive. This is due to the cost of tobacco and the long-term costs of treating health problems from smoking. Secondhand smoke may affect those around you. Secondhand smoke can cause lung cancer, breathing problems, and heart disease. Children of smokers have a higher risk for: Sudden infant death syndrome (SIDS). Ear infections. Lung infections. What  actions can I take to prevent health problems? Quit smoking  Do not start smoking. Quit if you already smoke. Do not replace cigarette smoking with vaping devices, such as e-cigarettes. Make a plan to quit smoking and commit to it. Look for programs to help you, and ask your health care provider for recommendations and ideas. Set a date and write down all the reasons you want to quit. Let your friends and family know you are quitting so they can help and support you. Consider finding friends who also want to quit. It can be easier to quit with someone else, so that you can support each other. Talk with your health care provider about using nicotine replacement medicines to help you quit. These include gum, lozenges, patches, sprays, or pills. If you try to quit but return to smoking, stay positive. It is common to slip up when you first quit, so take it one day at a time. Be prepared for cravings. When you feel the urge to smoke, chew gum or suck on hard candy. Lifestyle Stay busy. Take care of your body. Get plenty of exercise, eat a healthy diet, and drink plenty of water. Find ways to manage your stress, such as meditation, yoga, exercise, or time spent with friends and family. Ask your health care provider about having regular tests (screenings) to check for cancer. This may include blood tests, imaging tests, and other tests. Where to find support To get support to quit smoking, consider: Asking your health care provider for more information and resources. Joining a support group for people who want to quit smoking in your local community. There are many effective programs that may help you to quit. Calling the smokefree.gov counselor   helpline at 1-800-QUIT-NOW 806-548-4269). Where to find more information You may find more information about quitting smoking from: Centers for Disease Control and Prevention: http://www.osborne.com/ [employer]: smokefree.gov American Lung Association:  freedomfromsmoking.org Contact a health care provider if: You have problems breathing. Your lips, nose, or fingers turn blue. You have chest pain. You are coughing up blood. You feel like you will faint. You have other health changes that cause you to worry. Summary Smoking tobacco can negatively affect your health, the health of those around you, your finances, and your social life. Do not start smoking. Quit if you already smoke. If you need help quitting, ask your health care provider. Consider joining a support group for people in your local community who want to quit smoking. There are many effective programs that may help you to quit. This information is not intended to replace advice given to you by your health care provider. Make sure you discuss any questions you have with your health care provider. Document Revised: 05/08/2021 Document Reviewed: 05/08/2021 Elsevier Patient Education  2023 [employer]. Asthma, Adult  Asthma is a condition that causes swelling and narrowing of the airways. These are the passages that lead from the nose and mouth down into the lungs. When asthma symptoms get worse it is called an asthma attack or flare. This can make it hard to breathe. Asthma flares can range from minor to life-threatening. There is no cure for asthma, but medicines and lifestyle changes can help to control it. What are the causes? It is not known exactly what causes asthma, but certain things can cause asthma symptoms to get worse (triggers). What can trigger an asthma attack? Cigarette smoke. Mold. Dust. Your pet's skin flakes (dander). Cockroaches. Pollen. Air pollution (like household cleaners, wood smoke, smog, or Therapist, occupational). What are the signs or symptoms? Trouble breathing (shortness of breath). Coughing. Making high-pitched whistling sounds when you breathe, most often when you breathe out (wheezing). Chest tightness. Tiredness with little activity. Poor  exercise tolerance. How is this treated? Controller medicines that help prevent asthma symptoms. Fast-acting reliever or rescue medicines. These give short-term relief of asthma symptoms. Allergy medicines if your attacks are brought on by allergens. Medicines to help control the body's defense (immune) system. Staying away from the things that cause asthma attacks. Follow these instructions at home: Avoiding triggers in your home Do not allow anyone to smoke in your home. Limit use of fireplaces and wood stoves. Get rid of pests (such as roaches and mice) and their droppings. Keep your home clean. Clean your floors. Dust regularly. Use cleaning products that do not smell. Wash bed sheets and blankets every week in hot water. Dry them in a dryer. Have someone vacuum when you are not home. Change your heating and air conditioning filters often. Use blankets that are made of polyester or cotton. General instructions Take over-the-counter and prescription medicines only as told by your doctor. Do not smoke or use any products that contain nicotine or tobacco. If you need help quitting, ask your doctor. Stay away from secondhand smoke. Avoid doing things outdoors when allergen counts are high and when air quality is low. Warm up before you exercise. Take time to cool down after exercise. Use a peak flow meter as told by your doctor. A peak flow meter is a tool that measures how well your lungs are working. Keep track of the peak flow meter's readings. Write them down. Follow your asthma action plan. This is a written plan for  taking care of your asthma and treating your attacks. Make sure you get all the shots (vaccines) that your doctor recommends. Ask your doctor about a flu shot and a pneumonia shot. Keep all follow-up visits. Contact a doctor if: You have wheezing, shortness of breath, or a cough even while taking medicine to prevent attacks. The mucus you cough up (sputum) is thicker  than usual. The mucus you cough up changes from clear or white to yellow, green, gray, or is bloody. You have problems from the medicine you are taking, such as: A rash. Itching. Swelling. Trouble breathing. You need reliever medicines more than 2-3 times a week. Your peak flow reading is still at 50-79% of your personal best after following the action plan for 1 hour. You have a fever. Get help right away if: You seem to be worse and are not responding to medicine during an asthma attack. You are short of breath even at rest. You get short of breath when doing very little activity. You have trouble eating, drinking, or talking. You have chest pain or tightness. You have a fast heartbeat. Your lips or fingernails start to turn blue. You are light-headed or dizzy, or you faint. Your peak flow is less than 50% of your personal best. You feel too tired to breathe normally. These symptoms may be an emergency. Get help right away. Call 911. Do not wait to see if the symptoms will go away. Do not drive yourself to the hospital. Summary Asthma is a long-term (chronic) condition in which the airways get tight and narrow. An asthma attack can make it hard to breathe. Asthma cannot be cured, but medicines and lifestyle changes can help control it. Make sure you understand how to avoid triggers and how and when to use your medicines. Avoid things that can cause allergy symptoms (allergens). These include animal skin flakes (dander) and pollen from trees or grass. Avoid things that pollute the air. These may include household cleaners, wood smoke, smog, or chemical odors. This information is not intended to replace advice given to you by your health care provider. Make sure you discuss any questions you have with your health care provider. Document Revised: 02/19/2021 Document Reviewed: 02/19/2021 Elsevier Patient Education  [place].

## 2022-04-05 ENCOUNTER — Ambulatory Visit
Admission: RE | Admit: 2022-04-05 | Discharge: 2022-04-05 | Disposition: A | Discharge status: Home / Self Care | Payer: Self-pay | Source: Ambulatory Visit | Attending: Gerontology | Admitting: Gerontology

## 2022-04-05 ENCOUNTER — Ambulatory Visit
Admission: RE | Admit: 2022-04-05 | Discharge: 2022-04-05 | Disposition: A | Discharge status: Home / Self Care | Payer: Self-pay | Attending: Gerontology | Admitting: Gerontology

## 2022-04-05 DIAGNOSIS — R062 Wheezing: Secondary | ICD-10-CM | POA: Insufficient documentation

## 2022-04-05 DIAGNOSIS — R0602 Shortness of breath: Secondary | ICD-10-CM

## 2022-04-10 ENCOUNTER — Encounter: Payer: Self-pay | Admitting: Gerontology

## 2022-04-10 ENCOUNTER — Ambulatory Visit: Payer: Medicaid Other | Admitting: Gerontology

## 2022-04-10 VITALS — BP 115/78 | HR 99 | Wt 239.8 lb

## 2022-04-10 DIAGNOSIS — R600 Localized edema: Secondary | ICD-10-CM | POA: Insufficient documentation

## 2022-04-10 DIAGNOSIS — R0902 Hypoxemia: Secondary | ICD-10-CM | POA: Insufficient documentation

## 2022-04-10 NOTE — Progress Notes (Signed)
Established Patient Office Visit  Subjective   Patient ID: Nathan Vasquez, male    DOB: 24-Oct-1980  Age: 41 y.o. MRN: [id number]  Chief Complaint  Patient presents with   Follow-up    HPI  Nathan Vasquez is a 41 y/o male, who has history of Asthma, COPD, Hepatitis C, Heroin use, and History of balanitis who presents for routine follow up. In clinic, his SpO2 ranged between 69%-93%, averaging in the low 80% range. He states that he fell two days ago after drinking a 12 ounce beer and injured his left elbow. He reports that he is unsure if he lost consciousness but does not remember the event very well. He is unsure of if he hit his head. He states that he is dizzy and tired, and he is having difficulty remaining awake today. He denies taking gabapentin today; he states that he takes it at night because it makes him feel drowsy and "drunk." He denies taking any illicit substances last night or today. He endorses only taking 2 meloxicam tablets for pain today. He states that he only slept for approximately 2 hours last night and occasionally has difficulty sleeping. He denies shortness of breath, chest pain, headache, blurred vision, and double vision. He endorses intermittent productive cough  He reports producing brown phlegm but is unsure if it is hemoptysis. He recently came to the clinic on 04/04/22 for shortness of breath and was given a 5-day prescription of prednisone and a chest X-ray was ordered. His chest X-ray on 04/05/22 impression showed lungs clear bilaterally with mild hyperinflation. He feels that his breathing has improved. He has not had anything to eat today, and has only had coffee to drink. He reports that his aunt drove him to the clinic today and would be transporting him home.  Review of Systems  Constitutional:  Positive for malaise/fatigue.  HENT: Negative.    Eyes:  Negative for blurred vision and double vision.  Respiratory:  Positive for cough and sputum  production (brown sputum).   Cardiovascular:  Positive for leg swelling. Negative for chest pain and palpitations.  Gastrointestinal: Negative.   Genitourinary: Negative.   Musculoskeletal:  Positive for joint pain (elbow pain from fall, R shoulder pain) and myalgias.  Neurological:  Positive for dizziness. Negative for headaches.  Psychiatric/Behavioral:  Positive for depression. Negative for suicidal ideas. The patient is nervous/anxious and has insomnia (chronic).       Objective:     BP 115/78   Pulse 99   Wt 239 lb 12.8 oz (108.8 kg)   SpO2 90%   BMI 32.52 kg/m  BP Readings from Last 3 Encounters:  04/10/22 115/78  04/04/22 138/87  04/02/22 112/69   Wt Readings from Last 3 Encounters:  04/10/22 239 lb 12.8 oz (108.8 kg)  04/04/22 237 lb 6.4 oz (107.7 kg)  04/02/22 234 lb 12.8 oz (106.5 kg)      Physical Exam Constitutional:      Comments: Severely lethargic - difficulty remaining awake during conversation  HENT:     Head: Normocephalic and atraumatic.  Eyes:     Comments: Pupil size equal. Pupils constricted, minimal reaction to light.   Cardiovascular:     Rate and Rhythm: Normal rate and regular rhythm.     Heart sounds: Normal heart sounds.  Pulmonary:     Breath sounds: Decreased air movement present. Decreased breath sounds present. No wheezing.     Comments: Diminished lung sounds in all lobes. Severely diminished  lung sounds in middle and lower lobes. Musculoskeletal:     Right lower leg: 1+ Edema present.     Left lower leg: 1+ Edema present.  Skin:    General: Skin is warm and dry.     Capillary Refill: Capillary refill takes less than 2 seconds.  Neurological:     Mental Status: He is lethargic.  Psychiatric:        Attention and Perception: He is inattentive.        Behavior: Behavior is cooperative.        Judgment: Judgment is inappropriate.     No results found for any visits on 04/10/22.  Last CBC Lab Results  Component Value Date    WBC 7.0 06/28/2021   HGB 15.1 06/28/2021   HCT 44.7 06/28/2021   MCV 93.5 06/28/2021   MCH 31.6 06/28/2021   RDW 12.2 06/28/2021   PLT 187 34/19/6222   Last metabolic panel Lab Results  Component Value Date   GLUCOSE 93 07/31/2021   NA 138 07/31/2021   K 4.6 07/31/2021   CL 100 07/31/2021   CO2 26 07/31/2021   BUN 13 07/31/2021   CREATININE 0.95 07/31/2021   EGFR 104 07/31/2021   CALCIUM 9.5 07/31/2021   PROT 6.9 07/31/2021   ALBUMIN 4.4 07/31/2021   LABGLOB 2.5 07/31/2021   AGRATIO 1.8 07/31/2021   BILITOT 0.4 07/31/2021   ALKPHOS 111 07/31/2021   AST 13 07/31/2021   ALT 23 07/31/2021   ANIONGAP 8 01/16/2019   Last lipids Lab Results  Component Value Date   CHOL 171 07/31/2021   HDL 77 07/31/2021   LDLCALC 70 07/31/2021   TRIG 139 07/31/2021   CHOLHDL 2.2 07/31/2021   Last hemoglobin A1c Lab Results  Component Value Date   HGBA1C 4.8 07/31/2021      The 10-year ASCVD risk score (Arnett DK, et al., 2019) is: 1.4%    Assessment & Plan:   Hypoxia - Educated the patient that his oxygen saturation was dangerously low and very strongly recommended calling emergency services. He did not feel that a trip to the emergency department was warranted, but it was emphasized that he was at serious risk of injury or death due to his oxygen saturation and decreased lung sounds. He agreed to have his aunt drive him to the emergency department. The severity of the situation was also relayed to his aunt, who was transporting him today, and she agreed to drive him to the emergency room.    2. Bilateral leg edema - He had bilateral leg edema with unknown origin at this time. Encouraged leg elevation and compression stockings and will re-evaluate edema in the future when he is more stable.  Return in about 2 weeks (around 04/24/2022), or if symptoms worsen or fail to improve.    Odelia Gage

## 2022-04-19 ENCOUNTER — Other Ambulatory Visit: Payer: Self-pay

## 2022-04-19 ENCOUNTER — Other Ambulatory Visit: Payer: Self-pay | Admitting: Gerontology

## 2022-04-19 DIAGNOSIS — Z8719 Personal history of other diseases of the digestive system: Secondary | ICD-10-CM

## 2022-04-19 MED ORDER — OMEPRAZOLE 20 MG PO CPDR
20.0000 mg | DELAYED_RELEASE_CAPSULE | Freq: Every day | ORAL | 2 refills | Status: DC
  Filled 2022-04-19 – 2022-04-30 (×2): qty 30, 30d supply, fill #0
  Filled 2022-06-05: qty 30, 30d supply, fill #1

## 2022-04-23 ENCOUNTER — Other Ambulatory Visit: Payer: Self-pay | Admitting: Gerontology

## 2022-04-25 ENCOUNTER — Ambulatory Visit: Payer: Medicaid Other | Admitting: Gerontology

## 2022-04-25 ENCOUNTER — Encounter: Payer: Self-pay | Admitting: Gerontology

## 2022-04-25 ENCOUNTER — Other Ambulatory Visit: Payer: Self-pay

## 2022-04-25 VITALS — BP 101/69 | HR 95 | Temp 97.9°F | Resp 16 | Ht 72.0 in | Wt 228.3 lb

## 2022-04-25 DIAGNOSIS — R051 Acute cough: Secondary | ICD-10-CM

## 2022-04-25 NOTE — Telephone Encounter (Signed)
Patient rescheduled 03/27/22 ov to 04/25/22. Patient came for OV, but left without being seen.

## 2022-04-25 NOTE — Progress Notes (Deleted)
Established Patient Office Visit  Subjective   Patient ID: YULIAN GOSNEY II, male    DOB: 1981-01-15  Age: 41 y.o. MRN: [id number]  Chief Complaint  Patient presents with   Follow-up   Cough    Patient c/o productive cough (green), runny nose x 2 days. Patient has felt feverish, but has not taken his temperature    HPI  Traveon Louro is a 41 y/o male, who has history of Asthma, COPD, Hepatitis C, Heroin use, and History of balanitis who presents for routine follow up visit. During his last clinic visit, his oxygen saturation was in the 80%'s and he was advised to go to the ED. Currently, he states   ROS    Objective:     BP 101/69 (BP Location: Right Arm, Patient Position: Sitting, Cuff Size: Large)   Pulse 95   Temp 97.9 F (36.6 C) (Oral)   Resp 16   Ht 6' (1.829 m)   Wt 228 lb 4.8 oz (103.6 kg)   SpO2 93%   BMI 30.96 kg/m  BP Readings from Last 3 Encounters:  04/25/22 101/69  04/10/22 115/78  04/04/22 138/87   Wt Readings from Last 3 Encounters:  04/25/22 228 lb 4.8 oz (103.6 kg)  04/10/22 239 lb 12.8 oz (108.8 kg)  04/04/22 237 lb 6.4 oz (107.7 kg)      Physical Exam   No results found for any visits on 04/25/22.  Last CBC Lab Results  Component Value Date   WBC 7.0 06/28/2021   HGB 15.1 06/28/2021   HCT 44.7 06/28/2021   MCV 93.5 06/28/2021   MCH 31.6 06/28/2021   RDW 12.2 06/28/2021   PLT 187 98/92/1194   Last metabolic panel Lab Results  Component Value Date   GLUCOSE 93 07/31/2021   NA 138 07/31/2021   K 4.6 07/31/2021   CL 100 07/31/2021   CO2 26 07/31/2021   BUN 13 07/31/2021   CREATININE 0.95 07/31/2021   EGFR 104 07/31/2021   CALCIUM 9.5 07/31/2021   PROT 6.9 07/31/2021   ALBUMIN 4.4 07/31/2021   LABGLOB 2.5 07/31/2021   AGRATIO 1.8 07/31/2021   BILITOT 0.4 07/31/2021   ALKPHOS 111 07/31/2021   AST 13 07/31/2021   ALT 23 07/31/2021   ANIONGAP 8 01/16/2019   Last lipids Lab Results  Component Value Date    CHOL 171 07/31/2021   HDL 77 07/31/2021   LDLCALC 70 07/31/2021   TRIG 139 07/31/2021   CHOLHDL 2.2 07/31/2021   Last hemoglobin A1c Lab Results  Component Value Date   HGBA1C 4.8 07/31/2021      The 10-year ASCVD risk score (Arnett DK, et al., 2019) is: 1.1%    Assessment & Plan:   Problem List Items Addressed This Visit   None   No follow-ups on file.    Relda Agosto Jerold Coombe, NP

## 2022-04-25 NOTE — Progress Notes (Signed)
Patient left without being seen.

## 2022-04-26 ENCOUNTER — Other Ambulatory Visit: Payer: Self-pay

## 2022-04-29 ENCOUNTER — Other Ambulatory Visit: Payer: Self-pay

## 2022-04-30 ENCOUNTER — Ambulatory Visit: Payer: Medicaid Other | Admitting: Rheumatology

## 2022-04-30 ENCOUNTER — Other Ambulatory Visit: Payer: Self-pay

## 2022-04-30 ENCOUNTER — Other Ambulatory Visit: Payer: Self-pay | Admitting: Gerontology

## 2022-04-30 MED ORDER — GABAPENTIN 100 MG PO CAPS
100.0000 mg | ORAL_CAPSULE | Freq: Three times a day (TID) | ORAL | 0 refills | Status: DC
  Filled 2022-04-30: qty 90, 30d supply, fill #0

## 2022-05-02 ENCOUNTER — Ambulatory Visit (INDEPENDENT_AMBULATORY_CARE_PROVIDER_SITE_OTHER): Payer: Medicaid Other | Admitting: Student in an Organized Health Care Education/Training Program

## 2022-05-02 ENCOUNTER — Other Ambulatory Visit: Payer: Self-pay

## 2022-05-02 ENCOUNTER — Encounter: Payer: Self-pay | Admitting: Student in an Organized Health Care Education/Training Program

## 2022-05-02 VITALS — BP 132/80 | HR 91 | Temp 97.8°F | Ht 72.0 in | Wt 230.0 lb

## 2022-05-02 DIAGNOSIS — J4540 Moderate persistent asthma, uncomplicated: Secondary | ICD-10-CM

## 2022-05-02 DIAGNOSIS — F17210 Nicotine dependence, cigarettes, uncomplicated: Secondary | ICD-10-CM

## 2022-05-02 DIAGNOSIS — R0602 Shortness of breath: Secondary | ICD-10-CM

## 2022-05-02 MED ORDER — NICOTINE 7 MG/24HR TD PT24
7.0000 mg | MEDICATED_PATCH | TRANSDERMAL | 0 refills | Status: AC
  Filled 2022-05-02: qty 14, 14d supply, fill #0

## 2022-05-02 MED ORDER — NICOTINE 21 MG/24HR TD PT24
21.0000 mg | MEDICATED_PATCH | TRANSDERMAL | 0 refills | Status: AC
  Filled 2022-05-02: qty 28, 28d supply, fill #0

## 2022-05-02 MED ORDER — NICOTINE 14 MG/24HR TD PT24
14.0000 mg | MEDICATED_PATCH | TRANSDERMAL | 0 refills | Status: AC
  Filled 2022-05-02: qty 14, 14d supply, fill #0

## 2022-05-02 MED ORDER — NICOTINE POLACRILEX 2 MG MT LOZG
2.0000 mg | LOZENGE | OROMUCOSAL | 3 refills | Status: AC | PRN
  Filled 2022-05-02: qty 72, 6d supply, fill #0

## 2022-05-02 MED ORDER — FLUTICASONE-SALMETEROL 500-50 MCG/ACT IN AEPB
2.0000 | INHALATION_SPRAY | Freq: Two times a day (BID) | RESPIRATORY_TRACT | 11 refills | Status: DC
  Filled 2022-05-02 (×2): qty 120, 30d supply, fill #0
  Filled 2022-06-05: qty 60, 30d supply, fill #1

## 2022-05-02 NOTE — Progress Notes (Signed)
Synopsis: Referred in for shortness of breath by Iloabachie, Chioma E, NP  Assessment & Plan:   #Moderate Persistent Asthma #Shortness of breath  Presents for shortness of breath and wheezing on exam consistent with uncontrolled asthma. This is likely in the setting of continued exposure to cigarette smoke given he is an active smoker. I will step up his ICS therapy to very high dose Advair 500-50 two puffs twice daily. Following control of his symptoms, I will consider stepping down the advair. He will continue on Montelukast. I will await better control of his symptoms prior to obtaining a PFT, and will hold off on blood work at the moment given his recent steroids. Eventually, he will require a pulmonary function test, a CBC w/ diff (evaluate for eosinophilia), and an allergen panel with IgE to evaluate asthma immune response. I have counseled him on calling our office should his symptoms not improve, and to present to the ED should he further worsen. Should symptoms not improve with increased dose ICS with advair, I will consider a burst of steroids to better control symptoms.  - fluticasone-salmeterol (ADVAIR DISKUS) 500-50 MCG/ACT AEPB; Inhale 2 puffs into the lungs in the morning and at bedtime.  Dispense: 120 each; Refill: 11  #Nicotine dependence, cigarettes, uncomplicated  Counseled him on the importance of complete smoking cessation, otherwise asthma will not be well controlled. We discussed different modalities and will be doing nicotine patches and lozenges for cessation.  - nicotine (NICODERM CQ - DOSED IN MG/24 HOURS) 14 mg/24hr patch; Place 1 patch (14 mg total) onto the skin daily for 14 days.  Dispense: 14 patch; Refill: 0 - nicotine (NICODERM CQ - DOSED IN MG/24 HOURS) 21 mg/24hr patch; Place 1 patch (21 mg total) onto the skin daily.  Dispense: 42 patch; Refill: 0 - nicotine (NICODERM CQ - DOSED IN MG/24 HR) 7 mg/24hr patch; Place 1 patch (7 mg total) onto the skin daily for  14 days.  Dispense: 14 patch; Refill: 0 - nicotine polacrilex (NICOTINE MINI) 2 MG lozenge; Take 1 lozenge (2 mg total) by mouth every 2 (two) hours as needed for smoking cessation.  Dispense: 72 lozenge; Refill: 3 - fluticasone-salmeterol (ADVAIR DISKUS) 500-50 MCG/ACT AEPB; Inhale 2 puffs into the lungs in the morning and at bedtime.  Dispense: 120 each; Refill: 11  #History of illicit drug use  Has history of smoking crack, amphetamines, and snorting heroine. He is at risk for the development of inhalational drug injury secondary to multiple exposures. I will revisit this at follow up and consider obtaining a chest CT to better evaluate his lung parenchyma.   Return in about 3 months (around 08/01/2022).  I spent 60 minutes caring for this patient today, including preparing to see the patient, obtaining a medical history , reviewing a separately obtained history, performing a medically appropriate examination and/or evaluation, counseling and educating the patient/family/caregiver, ordering medications, tests, or procedures, and documenting clinical information in the electronic health record  Raechel Chute, MD [facility] 05/02/2022 3:40 PM    End of visit medications:  Meds ordered this encounter  Medications   nicotine (NICODERM CQ - DOSED IN MG/24 HOURS) 14 mg/24hr patch    Sig: Place 1 patch (14 mg total) onto the skin daily for 14 days.    Dispense:  14 patch    Refill:  0   nicotine (NICODERM CQ - DOSED IN MG/24 HOURS) 21 mg/24hr patch    Sig: Place 1 patch (21 mg total)  onto the skin daily.    Dispense:  42 patch    Refill:  0   nicotine (NICODERM CQ - DOSED IN MG/24 HR) 7 mg/24hr patch    Sig: Place 1 patch (7 mg total) onto the skin daily for 14 days.    Dispense:  14 patch    Refill:  0   nicotine polacrilex (NICOTINE MINI) 2 MG lozenge    Sig: Take 1 lozenge (2 mg total) by mouth every 2 (two) hours as needed for smoking cessation.    Dispense:  72  lozenge    Refill:  3   fluticasone-salmeterol (ADVAIR DISKUS) 500-50 MCG/ACT AEPB    Sig: Inhale 2 puffs into the lungs in the morning and at bedtime.    Dispense:  120 each    Refill:  11     Current Outpatient Medications:    acetaminophen (TYLENOL) 500 MG tablet, Take 1,000 mg by mouth every 6 (six) hours as needed., Disp: , Rfl:    albuterol (PROVENTIL HFA) 108 (90 Base) MCG/ACT inhaler, Inhale 1-2 puffs into the lungs once every 6 (six) hours as needed for wheezing or shortness of breath., Disp: 6.7 g, Rfl: 3   amLODipine (NORVASC) 10 MG tablet, Take 1 tablet (10 mg total) by mouth once daily., Disp: 90 tablet, Rfl: 1   fluticasone-salmeterol (ADVAIR DISKUS) 500-50 MCG/ACT AEPB, Inhale 2 puffs into the lungs in the morning and at bedtime., Disp: 120 each, Rfl: 11   gabapentin (NEURONTIN) 100 MG capsule, Take 1 capsule (100 mg total) by mouth 3 (three) times daily., Disp: 90 capsule, Rfl: 0   meloxicam (MOBIC) 7.5 MG tablet, Take 1 tablet (7.5 mg total) by mouth daily., Disp: 30 tablet, Rfl: 0   montelukast (SINGULAIR) 10 MG tablet, Take 1 tablet (10 mg total) by mouth once nightly at bedtime., Disp: 90 tablet, Rfl: 0   nicotine (NICODERM CQ - DOSED IN MG/24 HOURS) 14 mg/24hr patch, Place 1 patch (14 mg total) onto the skin daily for 14 days., Disp: 14 patch, Rfl: 0   nicotine (NICODERM CQ - DOSED IN MG/24 HOURS) 21 mg/24hr patch, Place 1 patch (21 mg total) onto the skin daily., Disp: 42 patch, Rfl: 0   nicotine (NICODERM CQ - DOSED IN MG/24 HR) 7 mg/24hr patch, Place 1 patch (7 mg total) onto the skin daily for 14 days., Disp: 14 patch, Rfl: 0   nicotine polacrilex (NICOTINE MINI) 2 MG lozenge, Take 1 lozenge (2 mg total) by mouth every 2 (two) hours as needed for smoking cessation., Disp: 72 lozenge, Rfl: 3   omeprazole (PRILOSEC) 20 MG capsule, Take 1 capsule (20 mg total) by mouth once daily., Disp: 30 capsule, Rfl: 2   Subjective:   PATIENT ID: Nathan Vasquez GENDER: male  DOB: [birth year], MRN: [id number]  Chief Complaint  Patient presents with   pulmonary consult    Hx of COPD. C/o SOB with incline, prod cough with green mucus and wheezing.     HPI  41 year old male patient with past medical history of asthma and previous history of polysubstance abuse presenting to clinic with shortness of breath.  Patient has been having worsening shortness of breath since around March of 2023.  He has increased dyspnea on exertion, wheezing, and cough.  The cough is productive of clear sputum.  He does not have any fevers or chills, no pleurisy, no chest pain.  He does report some chest tightness that is associated with the breathing symptoms. He was  given prednisone with brief improvement in his symptoms, and has cycled through multiple inhalers. He had been on Advair in the past, and is now on [employer] PacificBreo Ellipta. He has modest [facility] in his symptoms [facility]. He is using his albuterol 3 to 4 times a day with some improvement. He smokes and also has significant second hand smoke exposure at home. He does not have any pets.  He is smoking a pack a day and has been smoking since age of 41.  He smokes marijuana twice a week and does not use a filter with his joints.  He denies any other drug use at the moment.  He does have a history of smoking crack cocaine, snorting heroin, snorting cocaine, and smoking crystal meth.  He reports he has not done so for a few years.  He was just released from prison where he was also not smoking cigarettes. He was in prison for two years and recently released. He has not used illicit drugs since being out.  Ancillary information including prior medications, full medical/surgical/family/social histories, and PFTs (when available) are listed below and have been reviewed.   Review of Systems  Constitutional:  Negative for chills, fever and weight loss.  Respiratory:  Positive for cough, sputum production, shortness of breath and wheezing.    Cardiovascular:  Negative for chest pain and palpitations.     Objective:   Vitals:   05/02/22 1510  BP: 132/80  Pulse: 91  Temp: 97.8 F (36.6 C)  TempSrc: Temporal  SpO2: 93%  Weight: 230 lb (104.3 kg)  Height: 6' (1.829 m)   93% on RA BMI Readings from Last 3 Encounters:  05/02/22 31.19 kg/m  04/25/22 30.96 kg/m  04/10/22 32.52 kg/m   Wt Readings from Last 3 Encounters:  05/02/22 230 lb (104.3 kg)  04/25/22 228 lb 4.8 oz (103.6 kg)  04/10/22 239 lb 12.8 oz (108.8 kg)    Physical Exam Constitutional:      General: He is not in acute distress.    Appearance: He is not ill-appearing.  HENT:     Nose: Nose normal.     Mouth/Throat:     Mouth: Mucous membranes are moist.  Eyes:     Pupils: Pupils are equal, round, and reactive to light.  Cardiovascular:     Rate and Rhythm: Normal rate and regular rhythm.     Pulses: Normal pulses.     Heart sounds: Normal heart sounds.  Pulmonary:     Effort: Pulmonary effort is normal.     Breath sounds: Wheezing present. No rales.  Abdominal:     Palpations: Abdomen is soft.  Neurological:     Mental Status: He is alert.     Ancillary Information    Past Medical History:  Diagnosis Date   Asthma    COPD (chronic obstructive pulmonary disease) (HCC)    Hep C w/o coma, chronic (HCC)    Heroin abuse (HCC)    History of balanitis    Hypertension      Family History  Problem Relation Age of Onset   COPD Mother    Diabetes Mother    Heart disease Mother    Anxiety disorder Mother    Stroke Father    Schizophrenia Father    Diabetes Maternal Grandmother    Diabetes Maternal Grandfather    Cancer Maternal Grandfather    Diabetes Paternal Grandmother    Diabetes Paternal Grandfather    Heart attack Paternal Grandfather  Past Surgical History:  Procedure Laterality Date   TOOTH EXTRACTION      Social History   Socioeconomic History   Marital status: Media planner    Spouse name: Not on file    Number of children: 1   Years of education: 6th grade   Highest education level: 6th grade  Occupational History   Occupation: NA  Tobacco Use   Smoking status: Every Day    Packs/day: 0.50    Years: 25.00    Total pack years: 12.50    Types: Cigarettes   Smokeless tobacco: Current  Vaping Use   Vaping Use: Former   Quit date: 12/21/2019  Substance and Sexual Activity   Alcohol use: Yes    Alcohol/week: 1.0 standard drink of alcohol    Types: 1 Cans of beer per week    Comment: seldom, once every 3-6 months   Drug use: Not Currently    Frequency: 7.0 times per week    Types: Heroin, Marijuana, Cocaine    Comment: last use 11/26/21 heroin.   Sexual activity: Yes    Partners: Female  Other Topics Concern   Not on file  Social History Narrative   Not on file   Social Determinants of Health   Financial Resource Strain: High Risk (04/10/2022)   Overall Financial Resource Strain (CARDIA)    Difficulty of Paying Living Expenses: Very hard  Food Insecurity: Food Insecurity Present (04/10/2022)   Hunger Vital Sign    Worried About Running Out of Food in the Last Year: Sometimes true    Ran Out of Food in the Last Year: Sometimes true  Transportation Needs: Unmet Transportation Needs (04/10/2022)   PRAPARE - Administrator, Civil Service (Medical): Yes    Lack of Transportation (Non-Medical): Yes  Physical Activity: Inactive (04/10/2022)   Exercise Vital Sign    Days of Exercise per Week: 0 days    Minutes of Exercise per Session: 0 min  Stress: Stress Concern Present (04/10/2022)   [employer] of Occupational Health - Occupational Stress Questionnaire    Feeling of Stress : Very much  Social Connections: Socially Isolated (04/10/2022)   Social Connection and Isolation Panel [NHANES]    Frequency of Communication with Friends and Family: More than three times a week    Frequency of Social Gatherings with Friends and Family: Not on file    Attends  Religious Services: Never    Active Member of Clubs or Organizations: No    Attends Banker Meetings: Never    Marital Status: Never married  Intimate Partner Violence: Not At Risk (04/10/2022)   Humiliation, Afraid, Rape, and Kick questionnaire    Fear of Current or Ex-Partner: No    Emotionally Abused: No    Physically Abused: No    Sexually Abused: No     Allergies  Allergen Reactions   Penicillins Rash     CBC    Component Value Date/Time   WBC 7.0 06/28/2021 1537   RBC 4.78 06/28/2021 1537   HGB 15.1 06/28/2021 1537   HGB 16.0 09/30/2019 1841   HCT 44.7 06/28/2021 1537   HCT 47.0 09/30/2019 1841   PLT 187 06/28/2021 1537   PLT 154 09/30/2019 1841   MCV 93.5 06/28/2021 1537   MCV 90 09/30/2019 1841   MCV 93 09/06/2014 0436   MCH 31.6 06/28/2021 1537   MCHC 33.8 06/28/2021 1537   RDW 12.2 06/28/2021 1537   RDW 11.9 09/30/2019 1841   RDW  14.1 09/06/2014 0436   LYMPHSABS 1.9 09/30/2019 1841   LYMPHSABS 0.5 (L) 09/06/2014 0436   MONOABS 0.4 09/16/2017 1133   MONOABS 0.3 09/06/2014 0436   EOSABS 0.1 09/30/2019 1841   EOSABS 0.0 09/06/2014 0436   BASOSABS 0.0 09/30/2019 1841   BASOSABS 0.0 09/06/2014 0436    Pulmonary Functions Testing Results:     No data to display          Outpatient Medications Prior to Visit  Medication Sig Dispense Refill   acetaminophen (TYLENOL) 500 MG tablet Take 1,000 mg by mouth every 6 (six) hours as needed.     albuterol (PROVENTIL HFA) 108 (90 Base) MCG/ACT inhaler Inhale 1-2 puffs into the lungs once every 6 (six) hours as needed for wheezing or shortness of breath. 6.7 g 3   amLODipine (NORVASC) 10 MG tablet Take 1 tablet (10 mg total) by mouth once daily. 90 tablet 1   gabapentin (NEURONTIN) 100 MG capsule Take 1 capsule (100 mg total) by mouth 3 (three) times daily. 90 capsule 0   meloxicam (MOBIC) 7.5 MG tablet Take 1 tablet (7.5 mg total) by mouth daily. 30 tablet 0   montelukast (SINGULAIR) 10 MG tablet Take  1 tablet (10 mg total) by mouth once nightly at bedtime. 90 tablet 0   omeprazole (PRILOSEC) 20 MG capsule Take 1 capsule (20 mg total) by mouth once daily. 30 capsule 2   fluticasone furoate-vilanterol (BREO ELLIPTA) 200-25 MCG/ACT AEPB Inhale 1 puff into the lungs once daily. 60 each 4   No facility-administered medications prior to visit.

## 2022-05-03 ENCOUNTER — Other Ambulatory Visit: Payer: Self-pay

## 2022-05-09 ENCOUNTER — Other Ambulatory Visit: Payer: Self-pay

## 2022-05-14 ENCOUNTER — Other Ambulatory Visit: Payer: Self-pay

## 2022-05-15 ENCOUNTER — Other Ambulatory Visit: Payer: Self-pay

## 2022-05-30 ENCOUNTER — Other Ambulatory Visit: Payer: Self-pay

## 2022-06-05 ENCOUNTER — Other Ambulatory Visit: Payer: Self-pay

## 2022-06-06 ENCOUNTER — Other Ambulatory Visit: Payer: Self-pay

## 2022-06-07 ENCOUNTER — Emergency Department
Admission: EM | Admit: 2022-06-07 | Discharge: 2022-06-07 | Disposition: A | Discharge status: Home / Self Care | Payer: Medicaid Other | Attending: Emergency Medicine | Admitting: Emergency Medicine

## 2022-06-07 ENCOUNTER — Other Ambulatory Visit: Payer: Self-pay

## 2022-06-07 DIAGNOSIS — J449 Chronic obstructive pulmonary disease, unspecified: Secondary | ICD-10-CM | POA: Insufficient documentation

## 2022-06-07 DIAGNOSIS — Z1152 Encounter for screening for COVID-19: Secondary | ICD-10-CM | POA: Diagnosis not present

## 2022-06-07 DIAGNOSIS — R509 Fever, unspecified: Secondary | ICD-10-CM | POA: Diagnosis present

## 2022-06-07 DIAGNOSIS — F17210 Nicotine dependence, cigarettes, uncomplicated: Secondary | ICD-10-CM

## 2022-06-07 DIAGNOSIS — L02215 Cutaneous abscess of perineum: Secondary | ICD-10-CM | POA: Diagnosis not present

## 2022-06-07 DIAGNOSIS — L0291 Cutaneous abscess, unspecified: Secondary | ICD-10-CM

## 2022-06-07 DIAGNOSIS — I10 Essential (primary) hypertension: Secondary | ICD-10-CM | POA: Diagnosis not present

## 2022-06-07 DIAGNOSIS — Z7951 Long term (current) use of inhaled steroids: Secondary | ICD-10-CM | POA: Diagnosis not present

## 2022-06-07 DIAGNOSIS — J45909 Unspecified asthma, uncomplicated: Secondary | ICD-10-CM | POA: Diagnosis not present

## 2022-06-07 DIAGNOSIS — R0602 Shortness of breath: Secondary | ICD-10-CM

## 2022-06-07 LAB — CBC WITH DIFFERENTIAL/PLATELET
Abs Immature Granulocytes: 0.02 10*3/uL (ref 0.00–0.07)
Basophils Absolute: 0 10*3/uL (ref 0.0–0.1)
Basophils Relative: 0 %
Eosinophils Absolute: 0.1 10*3/uL (ref 0.0–0.5)
Eosinophils Relative: 1 %
HCT: 48.6 % (ref 39.0–52.0)
Hemoglobin: 16.1 g/dL (ref 13.0–17.0)
Immature Granulocytes: 0 %
Lymphocytes Relative: 15 %
Lymphs Abs: 1.2 10*3/uL (ref 0.7–4.0)
MCH: 29.7 pg (ref 26.0–34.0)
MCHC: 33.1 g/dL (ref 30.0–36.0)
MCV: 89.7 fL (ref 80.0–100.0)
Monocytes Absolute: 0.4 10*3/uL (ref 0.1–1.0)
Monocytes Relative: 5 %
Neutro Abs: 6.6 10*3/uL (ref 1.7–7.7)
Neutrophils Relative %: 79 %
Platelets: 221 10*3/uL (ref 150–400)
RBC: 5.42 MIL/uL (ref 4.22–5.81)
RDW: 13.6 % (ref 11.5–15.5)
WBC: 8.3 10*3/uL (ref 4.0–10.5)
nRBC: 0 % (ref 0.0–0.2)

## 2022-06-07 LAB — COMPREHENSIVE METABOLIC PANEL
ALT: 17 U/L (ref 0–44)
AST: 24 U/L (ref 15–41)
Albumin: 4.1 g/dL (ref 3.5–5.0)
Alkaline Phosphatase: 77 U/L (ref 38–126)
Anion gap: 8 (ref 5–15)
BUN: 7 mg/dL (ref 6–20)
CO2: 26 mmol/L (ref 22–32)
Calcium: 9.2 mg/dL (ref 8.9–10.3)
Chloride: 99 mmol/L (ref 98–111)
Creatinine, Ser: 0.78 mg/dL (ref 0.61–1.24)
GFR, Estimated: >60 mL/min (ref >60)
Glucose, Bld: 96 mg/dL (ref 70–99)
Potassium: 3.6 mmol/L (ref 3.5–5.1)
Sodium: 133 mmol/L — ABNORMAL LOW (ref 135–145)
Total Bilirubin: 1.2 mg/dL (ref 0.3–1.2)
Total Protein: 7.4 g/dL (ref 6.5–8.1)

## 2022-06-07 LAB — RESP PANEL BY RT-PCR (RSV, FLU A&B, COVID)  RVPGX2
Influenza A by PCR: NEGATIVE
Influenza B by PCR: NEGATIVE
Resp Syncytial Virus by PCR: NEGATIVE
SARS Coronavirus 2 by RT PCR: NEGATIVE

## 2022-06-07 MED ORDER — DOXYCYCLINE MONOHYDRATE 100 MG PO TABS
100.0000 mg | ORAL_TABLET | Freq: Two times a day (BID) | ORAL | 0 refills | Status: DC
  Filled 2022-06-07: qty 20, 10d supply, fill #0

## 2022-06-07 MED ORDER — SODIUM CHLORIDE 0.9 % IV BOLUS
1000.0000 mL | Freq: Once | INTRAVENOUS | Status: AC
  Administered 2022-06-07: 1000 mL via INTRAVENOUS

## 2022-06-07 MED ORDER — FLUTICASONE-SALMETEROL 500-50 MCG/ACT IN AEPB
2.0000 | INHALATION_SPRAY | Freq: Two times a day (BID) | RESPIRATORY_TRACT | 11 refills | Status: DC
  Filled 2022-06-07 – 2022-07-08 (×2): qty 120, 30d supply, fill #0

## 2022-06-07 NOTE — ED Provider Notes (Signed)
[facility] Provider Note    Event Date/Time   First MD Initiated Contact with Patient 06/07/22 1623     (approximate)   History   Abscess   HPI  Nathan Vasquez is a 42 y.o. male with history of asthma, heroin abuse, COPD, hep C and hypertension presents emergency department complaining of fever, chills, 2 recent abscesses 1 that has drained and the other is still draining at this time.  Patient started taking doxycycline 3 days ago.  States it was a leftover medication.  States he woke up today and just feel so bad he feels so bad and doesn;t know what to do.  Some cold symptoms also.  No vomiting or diarrhea      Physical Exam   Triage Vital Signs: ED Triage Vitals  Enc Vitals Group     BP 06/07/22 1546 (!) 140/107     Pulse Rate 06/07/22 1546 (!) 104     Resp 06/07/22 1546 18     Temp 06/07/22 1546 98 F (36.7 C)     Temp Source 06/07/22 1546 Oral     SpO2 06/07/22 1546 95 %     Weight --      Height --      Head Circumference --      Peak Flow --      Pain Score 06/07/22 1545 8     Pain Loc --      Pain Edu? --      Excl. in [place]? --     Most recent vital signs: Vitals:   06/07/22 1546 06/07/22 1827  BP: (!) 140/107 (!) 140/99  Pulse: (!) 104 99  Resp: 18 18  Temp: 98 F (36.7 C) 98.4 F (36.9 C)  SpO2: 95% 95%     General: Awake, no distress.   CV:  Good peripheral perfusion. regular rate and  rhythm Resp:  Normal effort. Lungs cta Abd:  No distention.  Healing abscess noted at the right lower quadrant, area is nonfluctuant at this time Other:  Skin below the scrotum at the perineum with a abscess noted that is currently draining, no induration noted surrounding the area, testicles are nontender   ED Results / Procedures / Treatments   Labs (all labs ordered are listed, but only abnormal results are displayed) Labs Reviewed  COMPREHENSIVE METABOLIC PANEL - Abnormal; Notable for the following components:       Result Value   Sodium 133 (*)    All other components within normal limits  RESP PANEL BY RT-PCR (RSV, FLU A&B, COVID)  RVPGX2  CBC WITH DIFFERENTIAL/PLATELET     EKG     RADIOLOGY     PROCEDURES:   Procedures   MEDICATIONS ORDERED IN ED: Medications  sodium chloride 0.9 % bolus 1,000 mL (0 mLs Intravenous Stopped 06/07/22 1742)     IMPRESSION / MDM / [place] / ED COURSE  I reviewed the triage vital signs and the nursing notes.                              Differential diagnosis includes, but is not limited to, COVID, influenza, RSV, sepsis, cellulitis, abscess  Patient's presentation is most consistent with acute complicated illness / injury requiring diagnostic workup.   I feel sepsis is less likely as the patient has a mildly elevated heart rate and is afebrile.  Will do basic labs to assess  white count along with kidney and liver functions.  Respiratory panel to assess for infectious disease.  He will be given 1 L normal saline IV.  Will hold off on any narcotics for pain due to his opioid abuse on this patient had an accidental drug overdose in July 2023.[name]   Patient's initial labs are reassuring, no elevation of white count to indicate sepsis. Respiratory panel is reassuring  Patient was placed on doxycycline.  He was encouraged to do sitz bath's as the area is already open and draining.  I do not feel that he needs to be admitted.  Patient has several concerns about how he can afford his medications.  I did do a consult with social work so they can call him at home to see how they can help him with medications.  He was discharged stable condition.   FINAL CLINICAL IMPRESSION(S) / ED DIAGNOSES   Final diagnoses:  Abscess     Rx / DC Orders   ED Discharge Orders          Ordered    doxycycline (ADOXA) 100 MG tablet  2 times daily        06/07/22 1819    fluticasone-salmeterol (ADVAIR DISKUS) 500-50 MCG/ACT AEPB  2 times daily         06/07/22 1820             Note:  This document was prepared using Dragon voice recognition software and may include unintentional dictation errors.    Versie Starks, PA-C 06/07/22 1857    Harvest Dark, MD 06/07/22 Kathyrn Drown

## 2022-06-07 NOTE — ED Triage Notes (Signed)
Pt comes with c/o abscess on belly and one under testicle. Pt states it has been there for about a week.

## 2022-06-07 NOTE — Discharge Instructions (Signed)
Please go to the following website to schedule new (and existing) patient appointments:   [email or web address]   The following is a list of primary care offices in the area who are accepting new patients at this time.  Please reach out to one of them directly and let them know you would like to schedule an appointment to follow up on an Emergency Department visit, and/or to establish a new primary care provider (PCP).  There are likely other primary care clinics in the are who are accepting new patients, but this is an excellent place to start:  [facility] Lead physician: Dr Angela Bacigalupo [address] [place], [place] [address] [phone]  [facility] Lead Physician: Dr Krichna Sowles [address], [place], [place] [address] [phone]  [facility]  Lead Physician: Dr Megan Johnson [address], [place], [place] [address] [phone]  [facility] [place] [facility] Lead Physician: Dr Alex Karamalegos [address], [place], [place] [address] [phone]  [facility] Lead Physician: Dr Laura Berglund [address], [place], [place] [address] [phone]   

## 2022-06-08 ENCOUNTER — Other Ambulatory Visit: Payer: Self-pay

## 2022-06-10 ENCOUNTER — Other Ambulatory Visit: Payer: Self-pay

## 2022-06-21 ENCOUNTER — Other Ambulatory Visit: Payer: Self-pay

## 2022-07-01 ENCOUNTER — Other Ambulatory Visit: Payer: Self-pay

## 2022-07-03 ENCOUNTER — Ambulatory Visit (INDEPENDENT_AMBULATORY_CARE_PROVIDER_SITE_OTHER): Payer: Medicaid Other | Admitting: Student in an Organized Health Care Education/Training Program

## 2022-07-03 ENCOUNTER — Encounter: Payer: Self-pay | Admitting: Student in an Organized Health Care Education/Training Program

## 2022-07-03 ENCOUNTER — Other Ambulatory Visit: Payer: Self-pay

## 2022-07-03 VITALS — BP 128/80 | HR 101 | Temp 98.0°F | Ht 72.0 in | Wt 212.4 lb

## 2022-07-03 DIAGNOSIS — R0602 Shortness of breath: Secondary | ICD-10-CM | POA: Diagnosis not present

## 2022-07-03 DIAGNOSIS — J4540 Moderate persistent asthma, uncomplicated: Secondary | ICD-10-CM

## 2022-07-03 MED ORDER — PREDNISONE 20 MG PO TABS
40.0000 mg | ORAL_TABLET | Freq: Every day | ORAL | 0 refills | Status: AC
  Filled 2022-07-03: qty 10, 5d supply, fill #0

## 2022-07-03 MED ORDER — ALBUTEROL SULFATE HFA 108 (90 BASE) MCG/ACT IN AERS
1.0000 | INHALATION_SPRAY | Freq: Four times a day (QID) | RESPIRATORY_TRACT | 3 refills | Status: DC | PRN
  Filled 2022-07-03: qty 18, 30d supply, fill #0
  Filled 2022-09-06: qty 18, 30d supply, fill #1

## 2022-07-03 NOTE — Patient Instructions (Signed)
Today, I ordered blood work. You can get them draw at your preferred [facility] draw station. The nearest one to [facility] is at nearby [place] ([place], [place], [place] [address]).

## 2022-07-03 NOTE — Progress Notes (Signed)
Synopsis: Follow up for Asthma  Assessment & Plan:   1. Shortness of breath 2. Moderate persistent asthma, unspecified whether complicated  Presents for shortness of breath and wheezing on exam consistent with asthma. This is likely in the setting of continued exposure to cigarette smoke given he is an active smoker. I did step up his therapy to the high dose advair during our last visit which I encouraged him to use. I will also prescribe him a short course of prednisone today.  He will continue on Montelukast as well.  I will obtain pulmonary function testing and order blood work to assess his asthma T-h response. I will order a CBC w/ diff (evaluate for eosinophilia), and an allergen panel with IgE. I will also order a CT scan of the chest today to evaluate his shortness of breath as he is having very difficult to control asthma.  - predniSONE (DELTASONE) 20 MG tablet; Take 2 tablets (40 mg total) by mouth daily with breakfast for 5 days.  Dispense: 10 tablet; Refill: 0 - CT CHEST WO CONTRAST; Future - Allergen Panel (27) + IGE - CBC with Differential/Platelet - Comprehensive metabolic panel - albuterol (VENTOLIN HFA) 108 (90 Base) MCG/ACT inhaler; Inhale 1-2 puffs into the lungs every 6 (six) hours as needed for wheezing or shortness of breath.  Dispense: 18 g; Refill: 3 - Pulmonary Function Test [facility] Only; Future   Return in about 3 months (around 10/01/2022).  I spent 30 minutes caring for this patient today, including preparing to see the patient, obtaining a medical history , reviewing a separately obtained history, performing a medically appropriate examination and/or evaluation, counseling and educating the patient/family/caregiver, and ordering medications, tests, or procedures  Armando Reichert, MD [facility] 07/03/2022 10:42 AM    End of visit medications:  Meds ordered this encounter  Medications   predniSONE (DELTASONE) 20 MG tablet    Sig: Take 2  tablets (40 mg total) by mouth daily with breakfast for 5 days.    Dispense:  10 tablet    Refill:  0   albuterol (VENTOLIN HFA) 108 (90 Base) MCG/ACT inhaler    Sig: Inhale 1-2 puffs into the lungs every 6 (six) hours as needed for wheezing or shortness of breath.    Dispense:  18 g    Refill:  3     Current Outpatient Medications:    fluticasone-salmeterol (ADVAIR DISKUS) 500-50 MCG/ACT AEPB, Inhale 2 puffs into the lungs in the morning and at bedtime. May decrease to 1 puff in the morning and at bedtime after a couple of weeks depending on symptoms., Disp: 120 each, Rfl: 11   montelukast (SINGULAIR) 10 MG tablet, Take 1 tablet (10 mg total) by mouth once nightly at bedtime., Disp: 90 tablet, Rfl: 0   predniSONE (DELTASONE) 20 MG tablet, Take 2 tablets (40 mg total) by mouth daily with breakfast for 5 days., Disp: 10 tablet, Rfl: 0   acetaminophen (TYLENOL) 500 MG tablet, Take 1,000 mg by mouth every 6 (six) hours as needed., Disp: , Rfl:    albuterol (VENTOLIN HFA) 108 (90 Base) MCG/ACT inhaler, Inhale 1-2 puffs into the lungs every 6 (six) hours as needed for wheezing or shortness of breath., Disp: 18 g, Rfl: 3   amLODipine (NORVASC) 10 MG tablet, Take 1 tablet (10 mg total) by mouth once daily. (Patient not taking: Reported on 07/03/2022), Disp: 90 tablet, Rfl: 1   gabapentin (NEURONTIN) 100 MG capsule, Take 1 capsule (100 mg total)  by mouth 3 (three) times daily. (Patient not taking: Reported on 07/03/2022), Disp: 90 capsule, Rfl: 0   meloxicam (MOBIC) 7.5 MG tablet, Take 1 tablet (7.5 mg total) by mouth daily. (Patient not taking: Reported on 07/03/2022), Disp: 30 tablet, Rfl: 0   nicotine polacrilex (NICOTINE MINI) 2 MG lozenge, Take 1 lozenge (2 mg total) by mouth every 2 (two) hours as needed for smoking cessation. (Patient not taking: Reported on 07/03/2022), Disp: 72 lozenge, Rfl: 3   omeprazole (PRILOSEC) 20 MG capsule, Take 1 capsule (20 mg total) by mouth once daily. (Patient not taking:  Reported on 07/03/2022), Disp: 30 capsule, Rfl: 2   Subjective:   PATIENT ID: Nathan Vasquez GENDER: male DOB: 1980-10-13, MRN: [id number]  Chief Complaint  Patient presents with   Follow-up    SOB with exertion, prod cough with with brown specks and wheezing.     HPI  42 year old male patient with past medical history of asthma and previous history of polysubstance abuse presenting to clinic for follow up on asthma.   Patient has been having worsening shortness of breath since around March of 2023.  He has increased dyspnea on exertion, wheezing, and cough.  The cough is productive of clear sputum.  He does not have any fevers or chills, no pleurisy, no chest pain.  He does report some chest tightness that is associated with the breathing symptoms. He was given prednisone with brief improvement in his symptoms, and has cycled through multiple inhalers. He smokes and also has significant second hand smoke exposure at home. He does not have any pets.  Today, he reports persistent symptoms. Patient has been prescribed Advair during our previous visit that he was using but caused him thrush, causing him to stop. He was prescribed a gargle by his dentist that he will be picking up and will continue using his Advair.   He continues to smoke, and does report improvement in symptoms when he does not smoke. He has been smoking since age of 14. He smokes marijuana twice a week and does not use a filter with his joints.  He denies any other drug use at the moment.  He does have a history of smoking crack cocaine, snorting heroin, snorting cocaine, and smoking crystal meth.  He reports he has not done so for a few years.  He was just released from prison where he was also not smoking cigarettes. He was in prison for two years and recently released. He has not used illicit drugs since being out.  Ancillary information including prior medications, full medical/surgical/family/social histories, and PFTs  (when available) are listed below and have been reviewed.   Review of Systems  Constitutional:  Positive for malaise/fatigue. Negative for chills, fever and weight loss.  Respiratory:  Positive for cough, shortness of breath and wheezing.   Cardiovascular:  Negative for chest pain.     Objective:   Vitals:   07/03/22 1015  BP: 128/80  Pulse: (!) 101  Temp: 98 F (36.7 C)  TempSrc: Temporal  SpO2: 91%  Weight: 212 lb 6.4 oz (96.3 kg)  Height: 6' (1.829 m)   91% on RA  BMI Readings from Last 3 Encounters:  07/03/22 28.81 kg/m  05/02/22 31.19 kg/m  04/25/22 30.96 kg/m   Wt Readings from Last 3 Encounters:  07/03/22 212 lb 6.4 oz (96.3 kg)  05/02/22 230 lb (104.3 kg)  04/25/22 228 lb 4.8 oz (103.6 kg)    Physical Exam Constitutional:  General: He is not in acute distress.    Appearance: He is not ill-appearing.  HENT:     Nose: Nose normal.     Mouth/Throat:     Mouth: Mucous membranes are moist.  Eyes:     Pupils: Pupils are equal, round, and reactive to light.  Cardiovascular:     Rate and Rhythm: Normal rate and regular rhythm.     Pulses: Normal pulses.     Heart sounds: Normal heart sounds.  Pulmonary:     Effort: Pulmonary effort is normal.     Breath sounds: Wheezing present. No rales.  Abdominal:     Palpations: Abdomen is soft.  Neurological:     Mental Status: He is alert.       Ancillary Information    Past Medical History:  Diagnosis Date   Asthma    COPD (chronic obstructive pulmonary disease) ([place])    Hep C w/o coma, chronic ([facility])    Heroin abuse ([place])    History of balanitis    Hypertension      Family History  Problem Relation Age of Onset   COPD Mother    Diabetes Mother    Heart disease Mother    Anxiety disorder Mother    Stroke Father    Schizophrenia Father    Diabetes Maternal Grandmother    Diabetes Maternal Grandfather    Cancer Maternal Grandfather    Diabetes Paternal Grandmother    Diabetes Paternal  Grandfather    Heart attack Paternal Grandfather      Past Surgical History:  Procedure Laterality Date   TOOTH EXTRACTION      Social History   Socioeconomic History   Marital status: Soil scientist    Spouse name: Not on file   Number of children: 1   Years of education: 6th grade   Highest education level: 6th grade  Occupational History   Occupation: NA  Tobacco Use   Smoking status: Every Day    Packs/day: 0.50    Years: 26.00    Total pack years: 13.00    Types: Cigarettes   Smokeless tobacco: Current  Vaping Use   Vaping Use: Former   Quit date: 12/21/2019  Substance and Sexual Activity   Alcohol use: Yes    Alcohol/week: 1.0 standard drink of alcohol    Types: 1 Cans of beer per week    Comment: seldom, once every 3-6 months   Drug use: Not Currently    Frequency: 7.0 times per week    Types: Heroin, Marijuana, Cocaine    Comment: last use 11/26/21 heroin.   Sexual activity: Yes    Partners: Female  Other Topics Concern   Not on file  Social History Narrative   Not on file   Social Determinants of Health   Financial Resource Strain: High Risk (04/10/2022)   Overall Financial Resource Strain (CARDIA)    Difficulty of Paying Living Expenses: Very hard  Food Insecurity: Food Insecurity Present (04/10/2022)   Hunger Vital Sign    Worried About Running Out of Food in the Last Year: Sometimes true    Ran Out of Food in the Last Year: Sometimes true  Transportation Needs: Unmet Transportation Needs (04/10/2022)   PRAPARE - Hydrologist (Medical): Yes    Lack of Transportation (Non-Medical): Yes  Physical Activity: Inactive (04/10/2022)   Exercise Vital Sign    Days of Exercise per Week: 0 days    Minutes of Exercise per Session:  0 min  Stress: Stress Concern Present (04/10/2022)   [employer] of Occupational Health - Occupational Stress Questionnaire    Feeling of Stress : Very much  Social Connections: Socially  Isolated (04/10/2022)   Social Connection and Isolation Panel [NHANES]    Frequency of Communication with Friends and Family: More than three times a week    Frequency of Social Gatherings with Friends and Family: Not on file    Attends Religious Services: Never    Active Member of Clubs or Organizations: No    Attends Banker Meetings: Never    Marital Status: Never married  Intimate Partner Violence: Not At Risk (04/10/2022)   Humiliation, Afraid, Rape, and Kick questionnaire    Fear of Current or Ex-Partner: No    Emotionally Abused: No    Physically Abused: No    Sexually Abused: No     Allergies  Allergen Reactions   Penicillins Rash     CBC    Component Value Date/Time   WBC 8.3 06/07/2022 1622   RBC 5.42 06/07/2022 1622   HGB 16.1 06/07/2022 1622   HGB 16.0 09/30/2019 1841   HCT 48.6 06/07/2022 1622   HCT 47.0 09/30/2019 1841   PLT 221 06/07/2022 1622   PLT 154 09/30/2019 1841   MCV 89.7 06/07/2022 1622   MCV 90 09/30/2019 1841   MCV 93 09/06/2014 0436   MCH 29.7 06/07/2022 1622   MCHC 33.1 06/07/2022 1622   RDW 13.6 06/07/2022 1622   RDW 11.9 09/30/2019 1841   RDW 14.1 09/06/2014 0436   LYMPHSABS 1.2 06/07/2022 1622   LYMPHSABS 1.9 09/30/2019 1841   LYMPHSABS 0.5 (L) 09/06/2014 0436   MONOABS 0.4 06/07/2022 1622   MONOABS 0.3 09/06/2014 0436   EOSABS 0.1 06/07/2022 1622   EOSABS 0.1 09/30/2019 1841   EOSABS 0.0 09/06/2014 0436   BASOSABS 0.0 06/07/2022 1622   BASOSABS 0.0 09/30/2019 1841   BASOSABS 0.0 09/06/2014 0436    Pulmonary Functions Testing Results:     No data to display          Outpatient Medications Prior to Visit  Medication Sig Dispense Refill   fluticasone-salmeterol (ADVAIR DISKUS) 500-50 MCG/ACT AEPB Inhale 2 puffs into the lungs in the morning and at bedtime. May decrease to 1 puff in the morning and at bedtime after a couple of weeks depending on symptoms. 120 each 11   montelukast (SINGULAIR) 10 MG tablet Take 1  tablet (10 mg total) by mouth once nightly at bedtime. 90 tablet 0   acetaminophen (TYLENOL) 500 MG tablet Take 1,000 mg by mouth every 6 (six) hours as needed.     amLODipine (NORVASC) 10 MG tablet Take 1 tablet (10 mg total) by mouth once daily. (Patient not taking: Reported on 07/03/2022) 90 tablet 1   gabapentin (NEURONTIN) 100 MG capsule Take 1 capsule (100 mg total) by mouth 3 (three) times daily. (Patient not taking: Reported on 07/03/2022) 90 capsule 0   meloxicam (MOBIC) 7.5 MG tablet Take 1 tablet (7.5 mg total) by mouth daily. (Patient not taking: Reported on 07/03/2022) 30 tablet 0   nicotine polacrilex (NICOTINE MINI) 2 MG lozenge Take 1 lozenge (2 mg total) by mouth every 2 (two) hours as needed for smoking cessation. (Patient not taking: Reported on 07/03/2022) 72 lozenge 3   omeprazole (PRILOSEC) 20 MG capsule Take 1 capsule (20 mg total) by mouth once daily. (Patient not taking: Reported on 07/03/2022) 30 capsule 2   albuterol (PROVENTIL HFA) 108 (  90 Base) MCG/ACT inhaler Inhale 1-2 puffs into the lungs once every 6 (six) hours as needed for wheezing or shortness of breath. (Patient not taking: Reported on 07/03/2022) 6.7 g 3   doxycycline (ADOXA) 100 MG tablet Take 1 tablet (100 mg total) by mouth 2 (two) times daily. (Patient not taking: Reported on 07/03/2022) 20 tablet 0   No facility-administered medications prior to visit.

## 2022-07-08 ENCOUNTER — Other Ambulatory Visit: Payer: Self-pay | Admitting: Student in an Organized Health Care Education/Training Program

## 2022-07-08 ENCOUNTER — Other Ambulatory Visit: Payer: Self-pay

## 2022-07-08 DIAGNOSIS — R0602 Shortness of breath: Secondary | ICD-10-CM

## 2022-07-08 DIAGNOSIS — F17210 Nicotine dependence, cigarettes, uncomplicated: Secondary | ICD-10-CM

## 2022-07-08 MED ORDER — FLUTICASONE-SALMETEROL 500-50 MCG/ACT IN AEPB
2.0000 | INHALATION_SPRAY | Freq: Two times a day (BID) | RESPIRATORY_TRACT | 11 refills | Status: DC
  Filled 2022-07-08: qty 120, 30d supply, fill #0
  Filled 2022-07-08: qty 60, 15d supply, fill #0
  Filled 2022-09-06: qty 120, 30d supply, fill #1

## 2022-07-09 ENCOUNTER — Other Ambulatory Visit: Payer: Self-pay

## 2022-07-15 ENCOUNTER — Other Ambulatory Visit: Payer: Self-pay

## 2022-07-15 ENCOUNTER — Ambulatory Visit: Admission: RE | Admit: 2022-07-15 | Payer: Medicaid Other | Source: Ambulatory Visit

## 2022-07-25 ENCOUNTER — Encounter (HOSPITAL_COMMUNITY): Payer: Self-pay | Admitting: Emergency Medicine

## 2022-07-25 ENCOUNTER — Emergency Department
Admission: EM | Admit: 2022-07-25 | Discharge: 2022-07-25 | Disposition: A | Discharge status: Home / Self Care | Payer: Medicaid Other | Attending: Emergency Medicine | Admitting: Emergency Medicine

## 2022-07-25 ENCOUNTER — Other Ambulatory Visit: Payer: Self-pay

## 2022-07-25 ENCOUNTER — Emergency Department (HOSPITAL_COMMUNITY)
Admission: EM | Admit: 2022-07-25 | Discharge: 2022-07-25 | Discharge status: Against Medical Advice | Payer: Medicaid Other | Attending: Emergency Medicine | Admitting: Emergency Medicine

## 2022-07-25 ENCOUNTER — Emergency Department (HOSPITAL_COMMUNITY): Payer: Medicaid Other

## 2022-07-25 DIAGNOSIS — M25512 Pain in left shoulder: Secondary | ICD-10-CM | POA: Insufficient documentation

## 2022-07-25 DIAGNOSIS — W19XXXA Unspecified fall, initial encounter: Secondary | ICD-10-CM | POA: Insufficient documentation

## 2022-07-25 DIAGNOSIS — Z5321 Procedure and treatment not carried out due to patient leaving prior to being seen by health care provider: Secondary | ICD-10-CM | POA: Diagnosis not present

## 2022-07-25 DIAGNOSIS — S42292A Other displaced fracture of upper end of left humerus, initial encounter for closed fracture: Secondary | ICD-10-CM | POA: Insufficient documentation

## 2022-07-25 DIAGNOSIS — W19XXXD Unspecified fall, subsequent encounter: Secondary | ICD-10-CM

## 2022-07-25 DIAGNOSIS — S42202A Unspecified fracture of upper end of left humerus, initial encounter for closed fracture: Secondary | ICD-10-CM

## 2022-07-25 DIAGNOSIS — W010XXA Fall on same level from slipping, tripping and stumbling without subsequent striking against object, initial encounter: Secondary | ICD-10-CM | POA: Insufficient documentation

## 2022-07-25 DIAGNOSIS — S4992XA Unspecified injury of left shoulder and upper arm, initial encounter: Secondary | ICD-10-CM | POA: Diagnosis present

## 2022-07-25 MED ORDER — OXYCODONE HCL 5 MG PO TABS
5.0000 mg | ORAL_TABLET | Freq: Once | ORAL | Status: AC
  Administered 2022-07-25: 5 mg via ORAL
  Filled 2022-07-25: qty 1

## 2022-07-25 MED ORDER — OXYCODONE HCL 5 MG PO TABS: 5.0000 mg | ORAL_TABLET | Freq: Four times a day (QID) | ORAL | 0 refills | Status: AC | PRN

## 2022-07-25 MED ORDER — OXYCODONE-ACETAMINOPHEN 5-325 MG PO TABS: 2.0000 | ORAL_TABLET | Freq: Once | ORAL | Status: DC

## 2022-07-25 MED ORDER — OXYCODONE HCL 5 MG PO TABS
5.0000 mg | ORAL_TABLET | Freq: Four times a day (QID) | ORAL | 0 refills | Status: DC | PRN
  Filled 2022-07-25: qty 20, 5d supply, fill #0

## 2022-07-25 NOTE — ED Provider Notes (Signed)
[facility] Provider Note    Event Date/Time   First MD Initiated Contact with Patient 07/25/22 1710     (approximate)   History   Fall   HPI  Nathan Vasquez is a 42 y.o. male who comes in with a fall.  Patient reports left shoulder pain after fall and some water at [facility].  Denies hitting his head no LOC.  He is right-hand dominant.  Patient had an x-ray ordered that confirmed humeral neck fracture but patient was not seen and had left the emergency room.  Patient reports shooting pain down the arm denies any numbness.  Still able to range the wrist.  He denies hitting his head denies LOC denies any neck pain.  He denies any other chest wall pain abdominal pain or other concerns.  Has been ambulatory.   Physical Exam   Triage Vital Signs: ED Triage Vitals  Enc Vitals Group     BP 07/25/22 1708 (!) 149/93     Pulse Rate 07/25/22 1708 63     Resp 07/25/22 1708 18     Temp 07/25/22 1708 98 F (36.7 C)     Temp src --      SpO2 07/25/22 1708 98 %     Weight --      Height --      Head Circumference --      Peak Flow --      Pain Score 07/25/22 1707 9     Pain Loc --      Pain Edu? --      Excl. in [place]? --     Most recent vital signs: Vitals:   07/25/22 1708  BP: (!) 149/93  Pulse: 63  Resp: 18  Temp: 98 F (36.7 C)  SpO2: 98%     General: Awake, no distress.  CV:  Good peripheral perfusion.  Resp:  Normal effort.  Abd:  No distention.  Other:  Patient has pain and bruising noted over the left shoulder.  No open wound noted.  He has decreased range of motion of the left shoulder.  He has no tenderness of the elbow or tenderness of the wrist.  Radial, ulnar, median nerve are all intact.  Able to flex and extend the wrist.  Good distal pulse.  Sensations intact. No chest wall pain.  No abdominal pain.  No CTL spine tenderness.  Range of motion of neck is intact.  No obvious hematoma noted to the head.  ED Results / Procedures /  Treatments   Labs (all labs ordered are listed, but only abnormal results are displayed) Labs Reviewed - No data to display    RADIOLOGY I have reviewed the xray personally and interpreted from yesterday and patient has humeral head fracture  PROCEDURES:  Critical Care performed: No  Procedures   MEDICATIONS ORDERED IN ED: Medications  oxyCODONE (Oxy IR/ROXICODONE) immediate release tablet 5 mg (has no administration in time range)     IMPRESSION / MDM / ASSESSMENT AND PLAN / ED COURSE  I reviewed the triage vital signs and the nursing notes.   Patient's presentation is most consistent with acute, uncomplicated illness.   Patient comes in with a fall yesterday.  It sounds mechanical in nature.  He had an x-ray done but did not stay but was found to have a humerus fracture.  Patient will be placed in a sling and given orthopedics number for follow-up.  We discussed pain control with Tylenol, ibuprofen  oxycodone for breakthrough pain.  We discussed not driving or working on the oxycodone.  We discussed any other injuries but he denies any other pain on examination.  Patient is currently neurovascularly intact and can follow-up outpatient with orthopedics   FINAL CLINICAL IMPRESSION(S) / ED DIAGNOSES   Final diagnoses:  Fall, subsequent encounter  Closed fracture of proximal end of left humerus, unspecified fracture morphology, initial encounter     Rx / DC Orders   ED Discharge Orders     None        Note:  This document was prepared using Dragon voice recognition software and may include unintentional dictation errors.   Vanessa Monango, MD 07/25/22 [phone]

## 2022-07-25 NOTE — ED Triage Notes (Signed)
Pt c/o falling at [employer] and injuring his left shoulder. Pt states that he has been unable to move it

## 2022-07-25 NOTE — Discharge Instructions (Addendum)
Take Tylenol 1 g every 8 hours.  Take ibuprofen 600 every 6-8 hours with food.  Take oxycodone for breakthrough pain.  Please call orthopedics to make a follow-up appointment  Your blood pressure was slightly elevated but you have this followed up with your primary care doctor  Take oxycodone as prescribed. Do not drink alcohol, drive or participate in any other potentially dangerous activities while taking this medication as it may make you sleepy. Do not take this medication with any other sedating medications, either prescription or over-the-counter. If you were prescribed Percocet or Vicodin, do not take these with acetaminophen (Tylenol) as it is already contained within these medications.  This medication is an opiate (or narcotic) pain medication and can be habit forming. Use it as little as possible to achieve adequate pain control. Do not use or use it with extreme caution if you have a history of opiate abuse or dependence. If you are on a pain contract with your primary care doctor or a pain specialist, be sure to let them know you were prescribed this medication today from the Emergency Department. This medication is intended for your use only - do not give any to anyone else and keep it in a secure place where nobody else, especially children, have access to it.    IMPRESSION: Humeral neck fracture as described.

## 2022-07-25 NOTE — ED Provider Triage Note (Addendum)
Emergency Medicine Provider Triage Evaluation Note  Nathan Vasquez , a 42 y.o. male  was evaluated in triage.  Pt complains of let shoulder pain after a fall in some water at [facility].  No head injury or LOC.  Right hand dominant.  Review of Systems  Positive: Left shoulder pain Negative: numbness  Physical Exam  BP (!) 148/101   Pulse (!) 120   Temp 98.2 F (36.8 C)   Resp 18   Ht 6' (1.829 m)   Wt 99.8 kg   BMI 29.84 kg/m   Gen:   Awake, no distress   Resp:  Normal effort  MSK:   Moves extremities without difficulty  Other:  Left arm in make shift sling, radial pulse intact  Medical Decision Making  Medically screening exam initiated at 3:48 AM.  Appropriate orders placed.  Nathan Vasquez was informed that the remainder of the evaluation will be completed by another provider, this initial triage assessment does not replace that evaluation, and the importance of remaining in the ED until their evaluation is complete.  Left shoulder pain s/p fall. X-ray ordered.  4:59 AM X-ray with humeral neck fracture.  Attempted to bring back to triage for treatment, however patient nor significant other seen in the lobby nor outside in parking lot.  Attempted to call cell phone # listed in chart several times, however call would not go through.     Larene Pickett, PA-C 07/25/22 0405    Larene Pickett, PA-C 07/25/22 0502

## 2022-07-25 NOTE — ED Triage Notes (Addendum)
Pt comes with c/o left arm and shoulder pain. Pt states he went into [place] and slipped on some water and fell. Pt stats he went to [facility] last night but left before being seen.   Xrays from last night show humeral neck fracture.

## 2022-07-25 NOTE — ED Notes (Signed)
Unable to find pt. EDP attempted to call phone twice, unable to get in contact with pt

## 2022-08-05 DIAGNOSIS — S42202A Unspecified fracture of upper end of left humerus, initial encounter for closed fracture: Secondary | ICD-10-CM | POA: Insufficient documentation

## 2022-09-06 ENCOUNTER — Other Ambulatory Visit: Payer: Self-pay

## 2022-09-09 ENCOUNTER — Other Ambulatory Visit: Payer: Self-pay

## 2022-09-20 ENCOUNTER — Telehealth: Payer: Self-pay

## 2022-09-20 NOTE — Telephone Encounter (Signed)
(  LVM) .. [facility] received a referral from [facility] to contact patient to establish care with one of our providers. PEC ,patient just needs a new patient appointment scheduled. Thank you

## 2022-09-22 ENCOUNTER — Encounter: Payer: Self-pay | Admitting: Emergency Medicine

## 2022-09-22 ENCOUNTER — Inpatient Hospital Stay
Admission: EM | Admit: 2022-09-22 | Discharge: 2022-09-24 | DRG: 189 | Disposition: A | Discharge status: Home / Self Care | Payer: Medicaid Other | Attending: Internal Medicine | Admitting: Internal Medicine

## 2022-09-22 ENCOUNTER — Other Ambulatory Visit: Payer: Self-pay

## 2022-09-22 ENCOUNTER — Emergency Department: Payer: Medicaid Other

## 2022-09-22 DIAGNOSIS — F1110 Opioid abuse, uncomplicated: Secondary | ICD-10-CM | POA: Diagnosis present

## 2022-09-22 DIAGNOSIS — F1190 Opioid use, unspecified, uncomplicated: Secondary | ICD-10-CM | POA: Diagnosis not present

## 2022-09-22 DIAGNOSIS — S42202D Unspecified fracture of upper end of left humerus, subsequent encounter for fracture with routine healing: Secondary | ICD-10-CM

## 2022-09-22 DIAGNOSIS — J9601 Acute respiratory failure with hypoxia: Secondary | ICD-10-CM | POA: Diagnosis not present

## 2022-09-22 DIAGNOSIS — I509 Heart failure, unspecified: Secondary | ICD-10-CM | POA: Diagnosis not present

## 2022-09-22 DIAGNOSIS — R600 Localized edema: Secondary | ICD-10-CM | POA: Diagnosis present

## 2022-09-22 DIAGNOSIS — N179 Acute kidney failure, unspecified: Secondary | ICD-10-CM | POA: Diagnosis present

## 2022-09-22 DIAGNOSIS — Z880 Allergy status to penicillin: Secondary | ICD-10-CM

## 2022-09-22 DIAGNOSIS — J4540 Moderate persistent asthma, uncomplicated: Secondary | ICD-10-CM | POA: Diagnosis present

## 2022-09-22 DIAGNOSIS — I10 Essential (primary) hypertension: Secondary | ICD-10-CM | POA: Diagnosis present

## 2022-09-22 DIAGNOSIS — J440 Chronic obstructive pulmonary disease with acute lower respiratory infection: Secondary | ICD-10-CM | POA: Diagnosis present

## 2022-09-22 DIAGNOSIS — F319 Bipolar disorder, unspecified: Secondary | ICD-10-CM | POA: Diagnosis present

## 2022-09-22 DIAGNOSIS — Z91199 Patient's noncompliance with other medical treatment and regimen due to unspecified reason: Secondary | ICD-10-CM

## 2022-09-22 DIAGNOSIS — Z79899 Other long term (current) drug therapy: Secondary | ICD-10-CM

## 2022-09-22 DIAGNOSIS — Z825 Family history of asthma and other chronic lower respiratory diseases: Secondary | ICD-10-CM

## 2022-09-22 DIAGNOSIS — Z823 Family history of stroke: Secondary | ICD-10-CM

## 2022-09-22 DIAGNOSIS — Z8249 Family history of ischemic heart disease and other diseases of the circulatory system: Secondary | ICD-10-CM

## 2022-09-22 DIAGNOSIS — Z1152 Encounter for screening for COVID-19: Secondary | ICD-10-CM

## 2022-09-22 DIAGNOSIS — J189 Pneumonia, unspecified organism: Secondary | ICD-10-CM | POA: Diagnosis present

## 2022-09-22 DIAGNOSIS — Z833 Family history of diabetes mellitus: Secondary | ICD-10-CM

## 2022-09-22 DIAGNOSIS — F411 Generalized anxiety disorder: Secondary | ICD-10-CM | POA: Diagnosis present

## 2022-09-22 DIAGNOSIS — T461X6A Underdosing of calcium-channel blockers, initial encounter: Secondary | ICD-10-CM | POA: Diagnosis present

## 2022-09-22 DIAGNOSIS — F17210 Nicotine dependence, cigarettes, uncomplicated: Secondary | ICD-10-CM | POA: Diagnosis present

## 2022-09-22 DIAGNOSIS — Z818 Family history of other mental and behavioral disorders: Secondary | ICD-10-CM

## 2022-09-22 DIAGNOSIS — B182 Chronic viral hepatitis C: Secondary | ICD-10-CM | POA: Diagnosis present

## 2022-09-22 LAB — COMPREHENSIVE METABOLIC PANEL
ALT: 11 U/L (ref 0–44)
AST: 17 U/L (ref 15–41)
Albumin: 4 g/dL (ref 3.5–5.0)
Alkaline Phosphatase: 92 U/L (ref 38–126)
Anion gap: 9 (ref 5–15)
BUN: 8 mg/dL (ref 6–20)
CO2: 33 mmol/L — ABNORMAL HIGH (ref 22–32)
Calcium: 9 mg/dL (ref 8.9–10.3)
Chloride: 96 mmol/L — ABNORMAL LOW (ref 98–111)
Creatinine, Ser: 0.81 mg/dL (ref 0.61–1.24)
GFR, Estimated: >60 mL/min (ref >60)
Glucose, Bld: 109 mg/dL — ABNORMAL HIGH (ref 70–99)
Potassium: 4.3 mmol/L (ref 3.5–5.1)
Sodium: 138 mmol/L (ref 135–145)
Total Bilirubin: 0.6 mg/dL (ref 0.3–1.2)
Total Protein: 7.6 g/dL (ref 6.5–8.1)

## 2022-09-22 LAB — CBC WITH DIFFERENTIAL/PLATELET
Abs Immature Granulocytes: 0.04 10*3/uL (ref 0.00–0.07)
Basophils Absolute: 0 10*3/uL (ref 0.0–0.1)
Basophils Relative: 0 %
Eosinophils Absolute: 0.1 10*3/uL (ref 0.0–0.5)
Eosinophils Relative: 2 %
HCT: 50.8 % (ref 39.0–52.0)
Hemoglobin: 15.2 g/dL (ref 13.0–17.0)
Immature Granulocytes: 1 %
Lymphocytes Relative: 18 %
Lymphs Abs: 1.4 10*3/uL (ref 0.7–4.0)
MCH: 27.3 pg (ref 26.0–34.0)
MCHC: 29.9 g/dL — ABNORMAL LOW (ref 30.0–36.0)
MCV: 91.4 fL (ref 80.0–100.0)
Monocytes Absolute: 0.5 10*3/uL (ref 0.1–1.0)
Monocytes Relative: 7 %
Neutro Abs: 5.9 10*3/uL (ref 1.7–7.7)
Neutrophils Relative %: 72 %
Platelets: 316 10*3/uL (ref 150–400)
RBC: 5.56 MIL/uL (ref 4.22–5.81)
RDW: 14.7 % (ref 11.5–15.5)
WBC: 8.1 10*3/uL (ref 4.0–10.5)
nRBC: 0 % (ref 0.0–0.2)

## 2022-09-22 LAB — BLOOD GAS, VENOUS
Acid-Base Excess: 12.5 mmol/L — ABNORMAL HIGH (ref 0.0–2.0)
Bicarbonate: 41 mmol/L — ABNORMAL HIGH (ref 20.0–28.0)
O2 Saturation: 49.4 %
Patient temperature: 37
pCO2, Ven: 71 mmHg (ref 44–60)
pH, Ven: 7.37 (ref 7.25–7.43)

## 2022-09-22 LAB — BRAIN NATRIURETIC PEPTIDE: B Natriuretic Peptide: 125.1 pg/mL — ABNORMAL HIGH (ref 0.0–100.0)

## 2022-09-22 LAB — URINE DRUG SCREEN, QUALITATIVE (ARMC ONLY)
Amphetamines, Ur Screen: NOT DETECTED
Barbiturates, Ur Screen: NOT DETECTED
Benzodiazepine, Ur Scrn: NOT DETECTED
Cannabinoid 50 Ng, Ur ~~LOC~~: POSITIVE — AB
Cocaine Metabolite,Ur ~~LOC~~: NOT DETECTED
MDMA (Ecstasy)Ur Screen: NOT DETECTED
Methadone Scn, Ur: POSITIVE — AB
Opiate, Ur Screen: POSITIVE — AB
Phencyclidine (PCP) Ur S: NOT DETECTED
Tricyclic, Ur Screen: NOT DETECTED

## 2022-09-22 LAB — SARS CORONAVIRUS 2 BY RT PCR: SARS Coronavirus 2 by RT PCR: NEGATIVE

## 2022-09-22 LAB — PROCALCITONIN: Procalcitonin: <0.1 ng/mL

## 2022-09-22 LAB — LACTIC ACID, PLASMA: Lactic Acid, Venous: 1 mmol/L (ref 0.5–1.9)

## 2022-09-22 MED ORDER — FUROSEMIDE 10 MG/ML IJ SOLN
40.0000 mg | Freq: Once | INTRAMUSCULAR | Status: AC
  Administered 2022-09-22: 40 mg via INTRAVENOUS
  Filled 2022-09-22: qty 4

## 2022-09-22 MED ORDER — ACETAMINOPHEN 650 MG RE SUPP: 650.0000 mg | Freq: Four times a day (QID) | RECTAL | Status: DC | PRN

## 2022-09-22 MED ORDER — SODIUM CHLORIDE 0.9 % IV SOLN: 1.0000 g | INTRAVENOUS | Status: DC

## 2022-09-22 MED ORDER — ENALAPRILAT 1.25 MG/ML IV SOLN
0.6250 mg | Freq: Once | INTRAVENOUS | Status: DC
  Filled 2022-09-22: qty 2

## 2022-09-22 MED ORDER — FOLIC ACID 1 MG PO TABS
1.0000 mg | ORAL_TABLET | Freq: Every day | ORAL | Status: DC
  Administered 2022-09-22 – 2022-09-24 (×3): 1 mg via ORAL
  Filled 2022-09-22 (×3): qty 1

## 2022-09-22 MED ORDER — SODIUM CHLORIDE 0.9 % IV SOLN: 500.0000 mg | INTRAVENOUS | Status: DC

## 2022-09-22 MED ORDER — MONTELUKAST SODIUM 10 MG PO TABS
10.0000 mg | ORAL_TABLET | Freq: Every day | ORAL | Status: DC
  Administered 2022-09-22 – 2022-09-23 (×2): 10 mg via ORAL
  Filled 2022-09-22 (×2): qty 1

## 2022-09-22 MED ORDER — SODIUM CHLORIDE 0.9 % IV SOLN
1.0000 g | Freq: Once | INTRAVENOUS | Status: AC
  Administered 2022-09-22: 1 g via INTRAVENOUS
  Filled 2022-09-22: qty 10

## 2022-09-22 MED ORDER — ADULT MULTIVITAMIN W/MINERALS CH
1.0000 | ORAL_TABLET | Freq: Every day | ORAL | Status: DC
  Administered 2022-09-23 – 2022-09-24 (×2): 1 via ORAL
  Filled 2022-09-22 (×2): qty 1

## 2022-09-22 MED ORDER — FUROSEMIDE 10 MG/ML IJ SOLN
40.0000 mg | Freq: Every day | INTRAMUSCULAR | Status: DC
  Administered 2022-09-23 – 2022-09-24 (×2): 40 mg via INTRAVENOUS
  Filled 2022-09-22 (×2): qty 4

## 2022-09-22 MED ORDER — THIAMINE MONONITRATE 100 MG PO TABS
100.0000 mg | ORAL_TABLET | Freq: Every day | ORAL | Status: DC
  Administered 2022-09-22 – 2022-09-24 (×3): 100 mg via ORAL
  Filled 2022-09-22 (×3): qty 1

## 2022-09-22 MED ORDER — BUDESONIDE 0.25 MG/2ML IN SUSP
0.2500 mg | Freq: Two times a day (BID) | RESPIRATORY_TRACT | Status: DC
  Administered 2022-09-22 – 2022-09-24 (×4): 0.25 mg via RESPIRATORY_TRACT
  Filled 2022-09-22 (×4): qty 2

## 2022-09-22 MED ORDER — SODIUM CHLORIDE 0.9 % IV SOLN
500.0000 mg | Freq: Once | INTRAVENOUS | Status: AC
  Administered 2022-09-22: 500 mg via INTRAVENOUS
  Filled 2022-09-22: qty 5

## 2022-09-22 MED ORDER — IPRATROPIUM-ALBUTEROL 0.5-2.5 (3) MG/3ML IN SOLN
3.0000 mL | Freq: Four times a day (QID) | RESPIRATORY_TRACT | Status: DC
  Administered 2022-09-22 – 2022-09-23 (×4): 3 mL via RESPIRATORY_TRACT
  Filled 2022-09-22 (×4): qty 3

## 2022-09-22 MED ORDER — PREDNISONE 50 MG PO TABS
60.0000 mg | ORAL_TABLET | Freq: Every day | ORAL | Status: DC
  Administered 2022-09-23 – 2022-09-24 (×2): 60 mg via ORAL
  Filled 2022-09-22: qty 3
  Filled 2022-09-22: qty 1

## 2022-09-22 MED ORDER — SODIUM CHLORIDE 0.9% FLUSH
3.0000 mL | Freq: Two times a day (BID) | INTRAVENOUS | Status: DC
  Administered 2022-09-22 – 2022-09-24 (×4): 3 mL via INTRAVENOUS

## 2022-09-22 MED ORDER — HYDROXYZINE HCL 50 MG PO TABS
25.0000 mg | ORAL_TABLET | Freq: Three times a day (TID) | ORAL | Status: DC | PRN
  Administered 2022-09-22 – 2022-09-24 (×4): 25 mg via ORAL
  Filled 2022-09-22 (×4): qty 1

## 2022-09-22 MED ORDER — IOHEXOL 350 MG/ML SOLN
75.0000 mL | Freq: Once | INTRAVENOUS | Status: AC | PRN
  Administered 2022-09-22: 75 mL via INTRAVENOUS

## 2022-09-22 MED ORDER — ONDANSETRON HCL 4 MG PO TABS: 4.0000 mg | ORAL_TABLET | Freq: Four times a day (QID) | ORAL | Status: DC | PRN

## 2022-09-22 MED ORDER — NICOTINE 14 MG/24HR TD PT24
14.0000 mg | MEDICATED_PATCH | Freq: Once | TRANSDERMAL | Status: AC
  Administered 2022-09-22: 14 mg via TRANSDERMAL
  Filled 2022-09-22: qty 1

## 2022-09-22 MED ORDER — METHADONE HCL 10 MG PO TABS
80.0000 mg | ORAL_TABLET | Freq: Every day | ORAL | Status: DC
  Administered 2022-09-23 – 2022-09-24 (×2): 80 mg via ORAL
  Filled 2022-09-22 (×2): qty 8

## 2022-09-22 MED ORDER — ONDANSETRON HCL 4 MG/2ML IJ SOLN: 4.0000 mg | Freq: Four times a day (QID) | INTRAMUSCULAR | Status: DC | PRN

## 2022-09-22 MED ORDER — METHYLPREDNISOLONE SODIUM SUCC 125 MG IJ SOLR
125.0000 mg | Freq: Once | INTRAMUSCULAR | Status: AC
  Administered 2022-09-22: 125 mg via INTRAVENOUS
  Filled 2022-09-22: qty 2

## 2022-09-22 MED ORDER — ENOXAPARIN SODIUM 40 MG/0.4ML IJ SOSY
40.0000 mg | PREFILLED_SYRINGE | INTRAMUSCULAR | Status: DC
  Administered 2022-09-22 – 2022-09-23 (×2): 40 mg via SUBCUTANEOUS
  Filled 2022-09-22 (×2): qty 0.4

## 2022-09-22 MED ORDER — NITROGLYCERIN 2 % TD OINT
0.5000 [in_us] | TOPICAL_OINTMENT | Freq: Four times a day (QID) | TRANSDERMAL | Status: DC
  Administered 2022-09-22: 0.5 [in_us] via TOPICAL
  Filled 2022-09-22: qty 1

## 2022-09-22 MED ORDER — ACETAMINOPHEN 325 MG PO TABS
650.0000 mg | ORAL_TABLET | Freq: Four times a day (QID) | ORAL | Status: DC | PRN
  Administered 2022-09-23 – 2022-09-24 (×3): 650 mg via ORAL
  Filled 2022-09-22 (×3): qty 2

## 2022-09-22 NOTE — Assessment & Plan Note (Signed)
Patient is presenting with several day history of gradually worsening shortness of breath and lower extremity edema.  He also endorses feeling weak and fatigued.  CTA was obtained with no evidence of PE, however diffuse pulmonary nodules concerning for infectious or inflammatory etiology.  Likely multifactorial in the setting of suspected heart failure, moderate persistent asthma with exacerbation and potential underlying CAP.    - Continue supplemental oxygen to maintain oxygen saturation above 88% - Wean as tolerated - Management of heart failure as noted below - Start DuoNebs every 6 hours - Pulmicort twice daily - Continue azithromycin and ceftriaxone - Procalcitonin pending - COVID-19 pending - If procalcitonin and COVID-19 PCR is negative, will order full respiratory viral panel

## 2022-09-22 NOTE — ED Notes (Signed)
Unable to properly document output due to girlfriend dumping urine . Patient and girlfriend reminded several times to please let nursing staff collect and measure urine output. Patient continues to take himself off of the monitor to walk to the bathroom with out staff knowledge. Patient again asked to please let nursing staff know so that we can assist and collect/document output.

## 2022-09-22 NOTE — Assessment & Plan Note (Signed)
Patient has a history of chronic lower extremity edema with acute worsening over the last several days.  Pitting edema is present beyond the knees.  BNP is elevated at 125.  Overall, concerning for congestive heart failure.  - Telemetry monitoring - Continue Lasix 40 mg daily - Echocardiogram ordered - Strict in and out - Daily weights

## 2022-09-22 NOTE — Assessment & Plan Note (Signed)
-   Atarax 25 mg every 8 hours as needed

## 2022-09-22 NOTE — H&P (Signed)
History and Physical    Nathan Vasquez: Nathan Vasquez:[id number] DOB: Nov 12, 1980 DOA: 09/22/2022 DOS: the Nathan Vasquez was seen and examined on 09/22/2022 PCP: Pcp, No  Nathan Vasquez coming from: Home  Chief Complaint:  Chief Complaint  Nathan Vasquez presents with   Leg Swelling   HPI: Nathan Vasquez is a 42 y.o. male with medical history significant of moderate persistent asthma, tobacco use disorder, hepatitis C with SVR, hypertension, substance use disorder, chronic lower extremity edema, bipolar disorder, who presents to the ED due to leg swelling.  Nathan Vasquez states that several days ago, Nathan Vasquez felt extremely fatigued and with generalized weakness. Then after that, Nathan Vasquez developed gradually worsening bilateral lower extremity swelling in addition to shortness of breath.  His symptoms have worsened significantly and Nathan Vasquez is finding difficult to exert at all, including going up the stairs.  Nathan Vasquez denies any known fevers but states Nathan Vasquez has been feeling hot and cold.  Nathan Vasquez denies any nausea, vomiting, diarrhea or abdominal pain.  Nathan Vasquez denies any chest pain or palpitations.  No reported cough.  Nathan Vasquez states Nathan Vasquez recently started in the methadone clinic at [facility].  Nathan Vasquez is still struggling with OUD with last fentanyl use a few days ago, intranasal only.  Nathan Vasquez states last time Nathan Vasquez used any intravenous substances was over a year ago.  ED course: On arrival to the ED, Nathan Vasquez was hypertensive at 149/83 with heart rate of 104.  Nathan Vasquez was saturating at 57% with good pleth.  Nathan Vasquez was placed on 6 L with improvement to 96%.  Workup notable for normal CBC, bicarb 33, glucose 109, creatinine 0.81 with GFR above 60.  BNP elevated at 125.  Chest x-ray with no acute cardiopulmonary disease. CTA was subsequently obtained that showed no evidence of PE, however scattered bilateral nodular airspace disease concerning for inflammatory or infectious etiology.  Nathan Vasquez started on azithromycin, ceftriaxone, Lasix and TR[facility] contacted for  admission.  Review of Systems: As mentioned in the history of present illness. All other systems reviewed and are negative.  Past Medical History:  Diagnosis Date   Asthma    COPD (chronic obstructive pulmonary disease) (HCC)    Hep C w/o coma, chronic (HCC)    Heroin abuse (HCC)    History of balanitis    Hypertension    Past Surgical History:  Procedure Laterality Date   TOOTH EXTRACTION     Social History:  reports that Nathan Vasquez has been smoking cigarettes. Nathan Vasquez has a 13.00 pack-year smoking history. Nathan Vasquez uses smokeless tobacco. Nathan Vasquez reports current alcohol use of about 1.0 standard drink of alcohol per week. Nathan Vasquez reports that Nathan Vasquez does not currently use drugs after having used the following drugs: Heroin, Marijuana, and Cocaine. Frequency: 7.00 times per week.  Allergies  Allergen Reactions   Penicillins Rash    Family History  Problem Relation Age of Onset   COPD Mother    Diabetes Mother    Heart disease Mother    Anxiety disorder Mother    Stroke Father    Schizophrenia Father    Diabetes Maternal Grandmother    Diabetes Maternal Grandfather    Cancer Maternal Grandfather    Diabetes Paternal Grandmother    Diabetes Paternal Grandfather    Heart attack Paternal Grandfather     Prior to Admission medications   Medication Sig Start Date End Date Taking? Authorizing Provider  acetaminophen (TYLENOL) 500 MG tablet Take 1,000 mg by mouth every 6 (six) hours as needed.    [provider]  albuterol (VENTOLIN HFA) 108 (90 Base) MCG/ACT inhaler Inhale 1-2 puffs into the lungs every 6 (six) hours as needed for wheezing or shortness of breath. 07/03/22   Raechel Chute, MD  amLODipine (NORVASC) 10 MG tablet Take 1 tablet (10 mg total) by mouth once daily. Nathan Vasquez not taking: Reported on 07/03/2022 12/11/21   Iloabachie, Chioma E, NP  fluticasone-salmeterol (ADVAIR DISKUS) 500-50 MCG/ACT AEPB Inhale 2 puffs into the lungs in the morning and at bedtime. May decrease to 1 puff in the  morning and at bedtime after a couple of weeks depending on symptoms. 07/08/22   Raechel Chute, MD  gabapentin (NEURONTIN) 100 MG capsule Take 1 capsule (100 mg total) by mouth 3 (three) times daily. Nathan Vasquez not taking: Reported on 07/03/2022 04/30/22   Iloabachie, Chioma E, NP  meloxicam (MOBIC) 7.5 MG tablet Take 1 tablet (7.5 mg total) by mouth daily. Nathan Vasquez not taking: Reported on 07/03/2022 04/02/22   Kandyce Rud., MD  montelukast (SINGULAIR) 10 MG tablet Take 1 tablet (10 mg total) by mouth once nightly at bedtime. 03/06/22   Iloabachie, Chioma E, NP  omeprazole (PRILOSEC) 20 MG capsule Take 1 capsule (20 mg total) by mouth once daily. Nathan Vasquez not taking: Reported on 07/03/2022 04/19/22   Iloabachie, Chioma E, NP  albuterol-ipratropium (COMBIVENT) 18-103 MCG/ACT inhaler Inhale 2 puffs into the lungs 4 (four) times daily as needed for wheezing. 06/08/16 09/09/19  Jeanmarie Plant, MD    Physical Exam: Vitals:   09/22/22 1332 09/22/22 1333 09/22/22 1336 09/22/22 1445  BP:   (!) 149/83 (!) 138/112  Pulse: (!) 112 (!) 110 (!) 102 92  Resp:   (!) 26 15  Temp:   98.5 F (36.9 C)   SpO2: (!) 86% 90% 96% 97%   Physical Exam Vitals and nursing note reviewed.  Constitutional:      Appearance: Nathan Vasquez is normal weight.  HENT:     Head: Normocephalic and atraumatic.     Mouth/Throat:     Mouth: Mucous membranes are moist.     Pharynx: Oropharynx is clear.  Eyes:     General: No scleral icterus.    Conjunctiva/sclera: Conjunctivae normal.  Cardiovascular:     Rate and Rhythm: Normal rate and regular rhythm.     Heart sounds: No murmur heard.    No gallop.     Comments: 2+ pitting edema up to the mid thighs. + JVD. Pulmonary:     Effort: Tachypnea present. No accessory muscle usage.     Breath sounds: Wheezing (Expiratory wheezing diffusely) and rhonchi (Diffuse) present.  Skin:    General: Skin is warm and dry.  Neurological:     General: No focal deficit present.     Mental Status:  Nathan Vasquez is alert and oriented to person, place, and time.  Psychiatric:        Mood and Affect: Mood is anxious.        Behavior: Behavior is cooperative.    Data Reviewed: CBC with WBC of 8.1, hemoglobin 15.2, MCV 91 and platelets of 316 CMP with sodium of 138, potassium 4.3, chloride 96, bicarb 33, glucose 109, BUN 8, creatinine 0.81, AST 17, ALT 11 and GFR of 60 BNP elevated at 125 Lactic acid within normal limits at 1.0  EKG personally reviewed.  Sinus rhythm with rate of 95.  P wave peaking concerning for atrial enlargement.  No ST or T wave changes concerning for acute ischemia.  CT Angio Chest PE W and/or Wo Contrast  Result Date: 09/22/2022 CLINICAL DATA:  Short of breath, bilateral lower extremity edema EXAM: CT ANGIOGRAPHY CHEST WITH CONTRAST TECHNIQUE: Multidetector CT imaging of the chest was performed using the standard protocol during bolus administration of intravenous contrast. Multiplanar CT image reconstructions and MIPs were obtained to evaluate the vascular anatomy. RADIATION DOSE REDUCTION: This exam was performed according to the departmental dose-optimization program which includes automated exposure control, adjustment of the mA and/or kV according to Nathan Vasquez size and/or use of iterative reconstruction technique. CONTRAST:  75mL OMNIPAQUE IOHEXOL 350 MG/ML SOLN COMPARISON:  09/05/2014, 09/22/2022 FINDINGS: Cardiovascular: There is technically adequate opacification of the central and segmental branches of the pulmonary vasculature. There are no filling defects or pulmonary emboli. The heart is unremarkable without pericardial effusion. No evidence of thoracic aortic aneurysm or dissection. Mediastinum/Nodes: Stable borderline enlarged mediastinal and hilar lymph nodes, which remain nonspecific. Largest in the AP window measures up to 13 mm reference image 56/4, previously measuring 12 mm. Thyroid gland, trachea, and esophagus demonstrate no significant findings. Lungs/Pleura: There  are some scattered areas of nodular consolidation seen bilaterally, within the periphery of the right upper lobe reference image 42/6, within the periphery of the lingular segment of the left upper lobe reference image 104/6, and within the Peri fissural right lower lobe reference image 90/6. Possible associated discrete nodule within the right lower lobe reference image 91/6 measuring 8 mm. The appearance is most suggestive of underlying inflammatory or infectious etiology. No effusion or pneumothorax.  The central airways are patent. Upper Abdomen: No acute abnormality. Musculoskeletal: Bilateral gynecomastia. Subacute proximal left humeral fracture with mild callus formation. No acute or destructive bony lesions. Reconstructed images demonstrate no additional findings. Review of the MIP images confirms the above findings. IMPRESSION: 1. No evidence of pulmonary embolus. 2. Scattered bilateral nodular airspace disease, with possible discrete nodule in the right lower lobe measuring up to 8 mm. Findings are likely inflammatory or infectious. Per Fleischner Society Guidelines, recommend a non-contrast Chest CT at 6-12 months. If Nathan Vasquez is high risk for malignancy, consider an additional non-contrast Chest CT at 18-24 months. If Nathan Vasquez is low risk for malignancy, non-contrast Chest CT at 18-24 months is optional. These guidelines do not apply to immunocompromised patients and patients with cancer. Follow up in patients with significant comorbidities as clinically warranted. For lung cancer screening, adhere to Lung-RADS guidelines. Reference: Radiology. 2017; 284(1):228-43. 3. Bilateral gynecomastia. 4. Subacute healing proximal left humeral fracture. Electronically Signed   By: Sharlet Salina M.D.   On: 09/22/2022 14:54   DG Chest Port 1 View  Result Date: 09/22/2022 CLINICAL DATA:  Shortness of breath and bilateral leg swelling. EXAM: PORTABLE CHEST 1 VIEW COMPARISON:  April 05, 2022 FINDINGS: The heart  size and mediastinal contours are within normal limits. There is no evidence of acute infiltrate, pleural effusion or pneumothorax. The visualized skeletal structures are unremarkable. IMPRESSION: No active cardiopulmonary disease. Electronically Signed   By: Aram Candela M.D.   On: 09/22/2022 13:44    Results are pending, will review when available.  Assessment and Plan:  * Acute hypoxic respiratory failure (HCC) Nathan Vasquez is presenting with several day history of gradually worsening shortness of breath and lower extremity edema.  Nathan Vasquez also endorses feeling weak and fatigued.  CTA was obtained with no evidence of PE, however diffuse pulmonary nodules concerning for infectious or inflammatory etiology.  Likely multifactorial in the setting of suspected heart failure, moderate persistent asthma with exacerbation and potential underlying CAP.    - Continue  supplemental oxygen to maintain oxygen saturation above 88% - Wean as tolerated - Management of heart failure as noted below - Start DuoNebs every 6 hours - Pulmicort twice daily - Continue azithromycin and ceftriaxone - Procalcitonin pending - COVID-19 pending - If procalcitonin and COVID-19 PCR is negative, will order full respiratory viral panel  Acute heart failure (HCC) Nathan Vasquez has a history of chronic lower extremity edema with acute worsening over the last several days.  Pitting edema is present beyond the knees.  BNP is elevated at 125.  Overall, concerning for congestive heart failure.  - Telemetry monitoring - Continue Lasix 40 mg daily - Echocardiogram ordered - Strict in and out - Daily weights  Opioid use disorder Nathan Vasquez has a history of active OUD, recently initiated on methadone MAT at [facility].  Still using fentanyl intermittently with last use several days ago.  - Continue reported methadone 80 mg daily  Essential hypertension Nathan Vasquez notes a history of hypertension, previously treated with amlodipine.  Nathan Vasquez has  not been to being it recently.  Will delay restarting his blood pressure is near goal and active diuresis will likely help.  Generalized anxiety disorder - Atarax 25 mg every 8 hours as needed  Advance Care Planning:   Code Status: Full Code verified by Nathan Vasquez with girlfriend at bedside  Consults: None  Family Communication: Nathan Vasquez's girlfriend updated at bedside  Severity of Illness: The appropriate Nathan Vasquez status for this Nathan Vasquez is OBSERVATION. Observation status is judged to be reasonable and necessary in order to provide the required intensity of service to ensure the Nathan Vasquez's safety. The Nathan Vasquez's presenting symptoms, physical exam findings, and initial radiographic and laboratory data in the context of their medical condition is felt to place them at decreased risk for further clinical deterioration. Furthermore, it is anticipated that the Nathan Vasquez will be medically stable for discharge from the hospital within 2 midnights of admission.   Author: Verdene Lennert, MD 09/22/2022 4:42 PM  For on call review [email or web address].

## 2022-09-22 NOTE — ED Provider Notes (Signed)
[facility] Provider Note    Event Date/Time   First MD Initiated Contact with Patient 09/22/22 1330     (approximate)   History   Leg Swelling   HPI  Nathan Vasquez is a 42 y.o. male who presents with complaints of leg swelling and shortness of breath over the last several days this has worsened.  Patient reports a history of smoking, also notes history of opioid dependence but has been on treatment for this     Physical Exam   Triage Vital Signs: ED Triage Vitals  Enc Vitals Group     BP 09/22/22 1336 (!) 149/83     Pulse Rate 09/22/22 1330 (!) 114     Resp 09/22/22 1336 (!) 26     Temp 09/22/22 1336 98.5 F (36.9 C)     Temp src --      SpO2 09/22/22 1330 (!) 59 %     Weight --      Height --      Head Circumference --      Peak Flow --      Pain Score 09/22/22 1313 10     Pain Loc --      Pain Edu? --      Excl. in GC? --     Most recent vital signs: Vitals:   09/22/22 1336 09/22/22 1445  BP: (!) 149/83 (!) 138/112  Pulse: (!) 102 92  Resp: (!) 26 15  Temp: 98.5 F (36.9 C)   SpO2: 96% 97%     General: Awake, no distress.  CV:  Good peripheral perfusion.  Tachycardia Resp:  Increased respiratory effort with tachypnea Abd:  No distention.  Other:  2+ lower extremity edema bilaterally   ED Results / Procedures / Treatments   Labs (all labs ordered are listed, but only abnormal results are displayed) Labs Reviewed  CBC WITH DIFFERENTIAL/PLATELET - Abnormal; Notable for the following components:      Result Value   MCHC 29.9 (*)    All other components within normal limits  COMPREHENSIVE METABOLIC PANEL - Abnormal; Notable for the following components:   Chloride 96 (*)    CO2 33 (*)    Glucose, Bld 109 (*)    All other components within normal limits  BRAIN NATRIURETIC PEPTIDE - Abnormal; Notable for the following components:   B Natriuretic Peptide 125.1 (*)    All other components within normal limits   CULTURE, BLOOD (ROUTINE X 2)  CULTURE, BLOOD (ROUTINE X 2)  LACTIC ACID, PLASMA  LACTIC ACID, PLASMA     EKG  ED ECG REPORT I, Jene Every, the attending physician, personally viewed and interpreted this ECG.  Date: 09/22/2022  Rhythm: normal sinus rhythm QRS Axis: normal Intervals: normal ST/T Wave abnormalities: normal Narrative Interpretation: no evidence of acute ischemia    RADIOLOGY Chest x-ray without acute normality,    PROCEDURES:  Critical Care performed: yes  CRITICAL CARE Performed by: Jene Every   Total critical care time: 30 minutes  Critical care time was exclusive of separately billable procedures and treating other patients.  Critical care was necessary to treat or prevent imminent or life-threatening deterioration.  Critical care was time spent personally by me on the following activities: development of treatment plan with patient and/or surrogate as well as nursing, discussions with consultants, evaluation of patient's response to treatment, examination of patient, obtaining history from patient or surrogate, ordering and performing treatments and interventions, ordering and review  of laboratory studies, ordering and review of radiographic studies, pulse oximetry and re-evaluation of patient's condition.   Procedures   MEDICATIONS ORDERED IN ED: Medications  nitroGLYCERIN (NITROGLYN) 2 % ointment 0.5 inch (0.5 inches Topical Given 09/22/22 1357)  cefTRIAXone (ROCEPHIN) 1 g in sodium chloride 0.9 % 100 mL IVPB (has no administration in time range)  azithromycin (ZITHROMAX) 500 mg in sodium chloride 0.9 % 250 mL IVPB (has no administration in time range)  furosemide (LASIX) injection 40 mg (40 mg Intravenous Given 09/22/22 1357)  iohexol (OMNIPAQUE) 350 MG/ML injection 75 mL (75 mLs Intravenous Contrast Given 09/22/22 1422)     IMPRESSION / MDM / ASSESSMENT AND PLAN / ED COURSE  I reviewed the triage vital signs and the nursing  notes. Patient's presentation is most consistent with acute presentation with potential threat to life or bodily function.  Patient brought back immediately given oxygen saturations of 53% on room air.  Nurse attempted to start nonrebreather but patient would not tolerate, nasal cannula at 6 L started with good response.  Given lower extremity edema, severe shortness of breath, suspect flash pulmonary edema, blood pressure is 150/83, heart rate is 102.  Will start Lasix give Nitropaste, pending x-ray, labs  Chest x-ray without evidence of pulmonary edema, will send for CT angiography to evaluate for PE  T angiography is negative for PE but consistent with pneumonia, this must be the cause of his hypoxia, will start Rocephin, azithromycin, and consulted the hospitalist service       FINAL CLINICAL IMPRESSION(S) / ED DIAGNOSES   Final diagnoses:  Peripheral edema  Community acquired pneumonia, unspecified laterality     Rx / DC Orders   ED Discharge Orders     None        Note:  This document was prepared using Dragon voice recognition software and may include unintentional dictation errors.   Jene Every, MD 09/22/22 [phone]

## 2022-09-22 NOTE — Assessment & Plan Note (Signed)
Patient notes a history of hypertension, previously treated with amlodipine.  He has not been to being it recently.  Will delay restarting his blood pressure is near goal and active diuresis will likely help.

## 2022-09-22 NOTE — Assessment & Plan Note (Signed)
Patient has a history of active OUD, recently initiated on methadone MAT at [facility].  Still using fentanyl intermittently with last use several days ago.  - Continue reported methadone 80 mg daily

## 2022-09-22 NOTE — ED Triage Notes (Signed)
Pt reports bilateral leg swelling and SHOB that started 2 days ago. Pt reports he was given compression socks that did not help.

## 2022-09-23 ENCOUNTER — Observation Stay (HOSPITAL_COMMUNITY)
Admit: 2022-09-23 | Discharge: 2022-09-23 | Disposition: A | Discharge status: Home / Self Care | Payer: Medicaid Other | Attending: Internal Medicine | Admitting: Internal Medicine

## 2022-09-23 ENCOUNTER — Encounter: Payer: Self-pay | Admitting: Internal Medicine

## 2022-09-23 DIAGNOSIS — J4540 Moderate persistent asthma, uncomplicated: Secondary | ICD-10-CM | POA: Diagnosis present

## 2022-09-23 DIAGNOSIS — Z91199 Patient's noncompliance with other medical treatment and regimen due to unspecified reason: Secondary | ICD-10-CM | POA: Diagnosis not present

## 2022-09-23 DIAGNOSIS — Z1152 Encounter for screening for COVID-19: Secondary | ICD-10-CM | POA: Diagnosis not present

## 2022-09-23 DIAGNOSIS — B182 Chronic viral hepatitis C: Secondary | ICD-10-CM | POA: Diagnosis present

## 2022-09-23 DIAGNOSIS — Z8249 Family history of ischemic heart disease and other diseases of the circulatory system: Secondary | ICD-10-CM | POA: Diagnosis not present

## 2022-09-23 DIAGNOSIS — Z833 Family history of diabetes mellitus: Secondary | ICD-10-CM | POA: Diagnosis not present

## 2022-09-23 DIAGNOSIS — F411 Generalized anxiety disorder: Secondary | ICD-10-CM | POA: Diagnosis present

## 2022-09-23 DIAGNOSIS — Z825 Family history of asthma and other chronic lower respiratory diseases: Secondary | ICD-10-CM | POA: Diagnosis not present

## 2022-09-23 DIAGNOSIS — F17210 Nicotine dependence, cigarettes, uncomplicated: Secondary | ICD-10-CM | POA: Diagnosis present

## 2022-09-23 DIAGNOSIS — J9601 Acute respiratory failure with hypoxia: Secondary | ICD-10-CM | POA: Diagnosis not present

## 2022-09-23 DIAGNOSIS — R600 Localized edema: Secondary | ICD-10-CM | POA: Diagnosis present

## 2022-09-23 DIAGNOSIS — Z823 Family history of stroke: Secondary | ICD-10-CM | POA: Diagnosis not present

## 2022-09-23 DIAGNOSIS — F319 Bipolar disorder, unspecified: Secondary | ICD-10-CM | POA: Diagnosis present

## 2022-09-23 DIAGNOSIS — J189 Pneumonia, unspecified organism: Secondary | ICD-10-CM | POA: Diagnosis present

## 2022-09-23 DIAGNOSIS — T461X6A Underdosing of calcium-channel blockers, initial encounter: Secondary | ICD-10-CM | POA: Diagnosis present

## 2022-09-23 DIAGNOSIS — I509 Heart failure, unspecified: Secondary | ICD-10-CM | POA: Diagnosis not present

## 2022-09-23 DIAGNOSIS — S42202D Unspecified fracture of upper end of left humerus, subsequent encounter for fracture with routine healing: Secondary | ICD-10-CM | POA: Diagnosis not present

## 2022-09-23 DIAGNOSIS — N179 Acute kidney failure, unspecified: Secondary | ICD-10-CM | POA: Diagnosis present

## 2022-09-23 DIAGNOSIS — J440 Chronic obstructive pulmonary disease with acute lower respiratory infection: Secondary | ICD-10-CM | POA: Diagnosis present

## 2022-09-23 DIAGNOSIS — F1110 Opioid abuse, uncomplicated: Secondary | ICD-10-CM | POA: Diagnosis present

## 2022-09-23 DIAGNOSIS — Z818 Family history of other mental and behavioral disorders: Secondary | ICD-10-CM | POA: Diagnosis not present

## 2022-09-23 DIAGNOSIS — I10 Essential (primary) hypertension: Secondary | ICD-10-CM | POA: Diagnosis present

## 2022-09-23 DIAGNOSIS — Z880 Allergy status to penicillin: Secondary | ICD-10-CM | POA: Diagnosis not present

## 2022-09-23 DIAGNOSIS — Z79899 Other long term (current) drug therapy: Secondary | ICD-10-CM | POA: Diagnosis not present

## 2022-09-23 LAB — ECHOCARDIOGRAM COMPLETE
AR max vel: 2.8 cm2
AV Area VTI: 2.88 cm2
AV Area mean vel: 2.8 cm2
AV Mean grad: 3.5 mmHg
AV Peak grad: 6.5 mmHg
Ao pk vel: 1.27 m/s
Area-P 1/2: 4.1 cm2
Height: 72 in
MV VTI: 2.78 cm2
S' Lateral: 3.4 cm
Weight: 3188.73 oz

## 2022-09-23 LAB — CBC
HCT: 45.5 % (ref 39.0–52.0)
Hemoglobin: 13.8 g/dL (ref 13.0–17.0)
MCH: 27.3 pg (ref 26.0–34.0)
MCHC: 30.3 g/dL (ref 30.0–36.0)
MCV: 89.9 fL (ref 80.0–100.0)
Platelets: 232 10*3/uL (ref 150–400)
RBC: 5.06 MIL/uL (ref 4.22–5.81)
RDW: 14.7 % (ref 11.5–15.5)
WBC: 4.5 10*3/uL (ref 4.0–10.5)
nRBC: 0 % (ref 0.0–0.2)

## 2022-09-23 LAB — BASIC METABOLIC PANEL
Anion gap: 7 (ref 5–15)
BUN: 13 mg/dL (ref 6–20)
CO2: 34 mmol/L — ABNORMAL HIGH (ref 22–32)
Calcium: 8.5 mg/dL — ABNORMAL LOW (ref 8.9–10.3)
Chloride: 94 mmol/L — ABNORMAL LOW (ref 98–111)
Creatinine, Ser: 0.69 mg/dL (ref 0.61–1.24)
GFR, Estimated: >60 mL/min (ref >60)
Glucose, Bld: 185 mg/dL — ABNORMAL HIGH (ref 70–99)
Potassium: 4.3 mmol/L (ref 3.5–5.1)
Sodium: 135 mmol/L (ref 135–145)

## 2022-09-23 LAB — HIV ANTIBODY (ROUTINE TESTING W REFLEX): HIV Screen 4th Generation wRfx: NONREACTIVE

## 2022-09-23 MED ORDER — AZITHROMYCIN 500 MG PO TABS
500.0000 mg | ORAL_TABLET | Freq: Every day | ORAL | Status: AC
  Administered 2022-09-23 – 2022-09-24 (×2): 500 mg via ORAL
  Filled 2022-09-23 (×2): qty 1

## 2022-09-23 MED ORDER — IPRATROPIUM-ALBUTEROL 0.5-2.5 (3) MG/3ML IN SOLN
3.0000 mL | Freq: Three times a day (TID) | RESPIRATORY_TRACT | Status: DC
  Administered 2022-09-23 – 2022-09-24 (×3): 3 mL via RESPIRATORY_TRACT
  Filled 2022-09-23 (×2): qty 3

## 2022-09-23 MED ORDER — METOPROLOL TARTRATE 5 MG/5ML IV SOLN: 5.0000 mg | INTRAVENOUS | Status: DC | PRN

## 2022-09-23 MED ORDER — SENNOSIDES-DOCUSATE SODIUM 8.6-50 MG PO TABS: 1.0000 | ORAL_TABLET | Freq: Every evening | ORAL | Status: DC | PRN

## 2022-09-23 MED ORDER — TRAZODONE HCL 50 MG PO TABS
50.0000 mg | ORAL_TABLET | Freq: Every evening | ORAL | Status: DC | PRN
  Administered 2022-09-23: 50 mg via ORAL
  Filled 2022-09-23: qty 1

## 2022-09-23 MED ORDER — HYDRALAZINE HCL 20 MG/ML IJ SOLN: 10.0000 mg | INTRAMUSCULAR | Status: DC | PRN

## 2022-09-23 MED ORDER — NICOTINE 21 MG/24HR TD PT24
21.0000 mg | MEDICATED_PATCH | Freq: Every day | TRANSDERMAL | Status: DC
  Administered 2022-09-23: 21 mg via TRANSDERMAL
  Filled 2022-09-23 (×2): qty 1

## 2022-09-23 MED ORDER — IPRATROPIUM-ALBUTEROL 0.5-2.5 (3) MG/3ML IN SOLN
3.0000 mL | RESPIRATORY_TRACT | Status: DC | PRN
  Filled 2022-09-23: qty 3

## 2022-09-23 NOTE — Progress Notes (Signed)
PT Cancellation Note  Patient Details Name: Nathan Vasquez MRN: [id number] DOB: 10-03-1980   Cancelled Treatment:    Reason Eval/Treat Not Completed: PT screened, no needs identified, will sign off. Chart reviewed, PT spoke with RN and OT, no acute PT needs at this time, PT to sign off.   Olga Coaster PT, DPT 3:20 PM,09/23/22

## 2022-09-23 NOTE — ED Notes (Signed)
Pharmacy called for methadone. Told that tech was out to deliver now. Pt is agitated, restless, complaining of hot and cold flashes, and generalized pain. Pt told by this RN that methadone was being delivered. Recliner given and extension tube for oxygen given so pt can move around room

## 2022-09-23 NOTE — Progress Notes (Signed)
PROGRESS NOTE    Nathan Vasquez  ZOX:[id number] DOB: [birth year] DOA: 09/22/2022 PCP: Pcp, No   Brief Narrative:   42 year old with history of moderate persistent asthma, tobacco use, hepatitis C with SVR, HTN, substance abuse, chronic lower extremity edema, bipolar disorder comes to the hospital complains of lower extremity swelling, generalized fatigue, dyspnea on exertion.  Currently follows at outpatient methadone clinic but recently use fentanyl.  Upon arrival noted to be hypoxic requiring 6 L nasal cannula, CTA chest was negative for PE but showed bilateral nodular opacity.  Patient was started on azithromycin, Rocephin and diuretics.  Assessment & Plan:  Principal Problem:   Acute hypoxic respiratory failure (HCC) Active Problems:   Acute heart failure (HCC)   Opioid use disorder   Essential hypertension   Generalized anxiety disorder     Assessment and Plan: * Acute hypoxic respiratory failure (HCC) Bilateral pulmonary nodules BNP minimally elevated.  Procalcitonin is negative.  Suspicion for infection is low therefore we will discontinue Rocephin.  We can empirically continue azithromycin for asthma/COPD (Undx) treatment. -Steroids, bronchodilators scheduled and as needed -COVID-negative.  Full respiratory panel-pending -Would recommend outpatient follow-up with PCP to closely monitor his pulmonary nodules  Acute heart failure (HCC) Elevated BNP with some signs of volume overload. -Continue daily Lasix 40 mg IV - Echocardiogram-pending  Opioid use disorder On methadone 80 mg daily but recently still use fentanyl  Essential hypertension Supposed to be on amlodipine but patient has been noncompliant  Generalized anxiety disorder - Atarax 25 mg every 8 hours as needed  Subacute healing proximal left humeral fracture  -From fall, follows outpatient [facility].    DVT prophylaxis: Lovenox Code Status: Full code Family Communication:   Shortness of breath  with abnormal breath sounds.  Continue hospital stay for at least 24 hours       Diet Orders (From admission, onward)     Start     Ordered   09/22/22 1624  Diet regular Room service appropriate? Yes; Fluid consistency: Thin  Diet effective now       Question Answer Comment  Room service appropriate? Yes   Fluid consistency: Thin      09/22/22 1623            Subjective: Seen and examined at bedside.  He is very anxious this morning and upset that he is due for his methadone and has not received it.  Otherwise his breathing is slowly improving   Examination:  General exam: Appears calm and comfortable  Respiratory system: b/l exp wheezing Cardiovascular system: S1 & S2 heard, RRR. No JVD, murmurs, rubs, gallops or clicks. No pedal edema. Gastrointestinal system: Abdomen is nondistended, soft and nontender. No organomegaly or masses felt. Normal bowel sounds heard. Central nervous system: Alert and oriented. No focal neurological deficits. Extremities: Symmetric 5 x 5 power. Skin: No rashes, lesions or ulcers Psychiatry: Judgement and insight appear normal. Mood & affect appropriate.  Objective: Vitals:   09/23/22 0530 09/23/22 0600 09/23/22 0630 09/23/22 0700  BP: 118/71 (!) 137/93 122/77 122/76  Pulse: (!) 54 (!) 55 60 62  Resp: (!) 9 16 13 10   Temp:      TempSrc:      SpO2: 97% 97% 93% 94%  Weight:      Height:        Intake/Output Summary (Last 24 hours) at 09/23/2022 0816 Last data filed at 09/23/2022 0749 Gross per 24 hour  Intake --  Output 3600 ml  Net -3600 ml  Filed Weights   09/22/22 1738 09/23/22 0230  Weight: 90.4 kg 90.4 kg    Scheduled Meds:  budesonide (PULMICORT) nebulizer solution  0.25 mg Nebulization BID   enoxaparin (LOVENOX) injection  40 mg Subcutaneous Q24H   folic acid  1 mg Oral Daily   furosemide  40 mg Intravenous Daily   ipratropium-albuterol  3 mL Nebulization Q6H   methadone  80 mg Oral Daily   montelukast  10 mg Oral  QHS   multivitamin with minerals  1 tablet Oral Daily   nicotine  14 mg Transdermal Once   predniSONE  60 mg Oral Q breakfast   sodium chloride flush  3 mL Intravenous Q12H   thiamine  100 mg Oral Daily   Continuous Infusions:  azithromycin (ZITHROMAX) 500 mg in sodium chloride 0.9 % 250 mL IVPB     cefTRIAXone (ROCEPHIN)  IV      Nutritional status     Body mass index is 27.03 kg/m.  Data Reviewed:   CBC: Recent Labs  Lab 09/22/22 1316 09/23/22 0454  WBC 8.1 4.5  NEUTROABS 5.9  --   HGB 15.2 13.8  HCT 50.8 45.5  MCV 91.4 89.9  PLT 316 232   Basic Metabolic Panel: Recent Labs  Lab 09/22/22 1316 09/23/22 0454  NA 138 135  K 4.3 4.3  CL 96* 94*  CO2 33* 34*  GLUCOSE 109* 185*  BUN 8 13  CREATININE 0.81 0.69  CALCIUM 9.0 8.5*   GFR: Estimated Creatinine Clearance: 133.4 mL/min (by C-G formula based on SCr of 0.69 mg/dL). Liver Function Tests: Recent Labs  Lab 09/22/22 1316  AST 17  ALT 11  ALKPHOS 92  BILITOT 0.6  PROT 7.6  ALBUMIN 4.0   No results for input(s): "LIPASE", "AMYLASE" in the last 168 hours. No results for input(s): "AMMONIA" in the last 168 hours. Coagulation Profile: No results for input(s): "INR", "PROTIME" in the last 168 hours. Cardiac Enzymes: No results for input(s): "CKTOTAL", "CKMB", "CKMBINDEX", "TROPONINI" in the last 168 hours. BNP (last 3 results) No results for input(s): "PROBNP" in the last 8760 hours. HbA1C: No results for input(s): "HGBA1C" in the last 72 hours. CBG: No results for input(s): "GLUCAP" in the last 168 hours. Lipid Profile: No results for input(s): "CHOL", "HDL", "LDLCALC", "TRIG", "CHOLHDL", "LDLDIRECT" in the last 72 hours. Thyroid Function Tests: No results for input(s): "TSH", "T4TOTAL", "FREET4", "T3FREE", "THYROIDAB" in the last 72 hours. Anemia Panel: No results for input(s): "VITAMINB12", "FOLATE", "FERRITIN", "TIBC", "IRON", "RETICCTPCT" in the last 72 hours. Sepsis Labs: Recent Labs  Lab  09/22/22 1340 09/22/22 1343  PROCALCITON <0.10  --   LATICACIDVEN  --  1.0    Recent Results (from the past 240 hour(s))  Blood culture (routine x 2)     Status: None (Preliminary result)   Collection Time: 09/22/22  1:43 PM   Specimen: BLOOD  Result Value Ref Range Status   Specimen Description BLOOD BLOOD RIGHT ARM  Final   Special Requests   Final    BOTTLES DRAWN AEROBIC AND ANAEROBIC Blood Culture adequate volume   Culture   Final    NO GROWTH < 24 HOURS Performed at [facility], [address] [place], [place] [address]    Report Status PENDING  Incomplete  Blood culture (routine x 2)     Status: None (Preliminary result)   Collection Time: 09/22/22  1:43 PM   Specimen: BLOOD  Result Value Ref Range Status   Specimen Description BLOOD  BLOOD LEFT ARM  Final   Special Requests   Final    BOTTLES DRAWN AEROBIC AND ANAEROBIC Blood Culture adequate volume   Culture   Final    NO GROWTH < 24 HOURS Performed at [facility], [address] [place], [place] [address]    Report Status PENDING  Incomplete  SARS Coronavirus 2 by RT PCR (hospital order, performed in [facility] hospital lab) *cepheid single result test* Anterior Nasal Swab     Status: None   Collection Time: 09/22/22  3:59 PM   Specimen: Anterior Nasal Swab  Result Value Ref Range Status   SARS Coronavirus 2 by RT PCR NEGATIVE NEGATIVE Final    Comment: (NOTE) SARS-CoV-2 target nucleic acids are NOT DETECTED.  The SARS-CoV-2 RNA is generally detectable in upper and lower respiratory specimens during the acute phase of infection. The lowest concentration of SARS-CoV-2 viral copies this assay can detect is 250 copies / mL. A negative result does not preclude SARS-CoV-2 infection and should not be used as the sole basis for treatment or other patient management decisions.  A negative result may occur with improper specimen collection / handling, submission of specimen other than  nasopharyngeal swab, presence of viral mutation(s) within the areas targeted by this assay, and inadequate number of viral copies (<250 copies / mL). A negative result must be combined with clinical observations, patient history, and epidemiological information.  Fact Sheet for Patients:   RoadLapTop.co.za  Fact Sheet for Healthcare Providers: http://kim-miller.com/  This test is not yet approved or  cleared by the [place] FDA and has been authorized for detection and/or diagnosis of SARS-CoV-2 by FDA under an Emergency Use Authorization (EUA).  This EUA will remain in effect (meaning this test can be used) for the duration of the COVID-19 declaration under Section 564(b)(1) of the Act, 21 U.S.C. section 360bbb-3(b)(1), unless the authorization is terminated or revoked sooner.  Performed at [facility], [address] [place], [place] [address]          Radiology Studies: CT Angio Chest PE W and/or Wo Contrast  Result Date: 09/22/2022 CLINICAL DATA:  Short of breath, bilateral lower extremity edema EXAM: CT ANGIOGRAPHY CHEST WITH CONTRAST TECHNIQUE: Multidetector CT imaging of the chest was performed using the standard protocol during bolus administration of intravenous contrast. Multiplanar CT image reconstructions and MIPs were obtained to evaluate the vascular anatomy. RADIATION DOSE REDUCTION: This exam was performed according to the departmental dose-optimization program which includes automated exposure control, adjustment of the mA and/or kV according to patient size and/or use of iterative reconstruction technique. CONTRAST:  75mL OMNIPAQUE IOHEXOL 350 MG/ML SOLN COMPARISON:  09/05/2014, 09/22/2022 FINDINGS: Cardiovascular: There is technically adequate opacification of the central and segmental branches of the pulmonary vasculature. There are no filling defects or pulmonary emboli. The heart is unremarkable without  pericardial effusion. No evidence of thoracic aortic aneurysm or dissection. Mediastinum/Nodes: Stable borderline enlarged mediastinal and hilar lymph nodes, which remain nonspecific. Largest in the AP window measures up to 13 mm reference image 56/4, previously measuring 12 mm. Thyroid gland, trachea, and esophagus demonstrate no significant findings. Lungs/Pleura: There are some scattered areas of nodular consolidation seen bilaterally, within the periphery of the right upper lobe reference image 42/6, within the periphery of the lingular segment of the left upper lobe reference image 104/6, and within the Peri fissural right lower lobe reference image 90/6. Possible associated discrete nodule within the right lower lobe reference image 91/6 measuring 8  mm. The appearance is most suggestive of underlying inflammatory or infectious etiology. No effusion or pneumothorax.  The central airways are patent. Upper Abdomen: No acute abnormality. Musculoskeletal: Bilateral gynecomastia. Subacute proximal left humeral fracture with mild callus formation. No acute or destructive bony lesions. Reconstructed images demonstrate no additional findings. Review of the MIP images confirms the above findings. IMPRESSION: 1. No evidence of pulmonary embolus. 2. Scattered bilateral nodular airspace disease, with possible discrete nodule in the right lower lobe measuring up to 8 mm. Findings are likely inflammatory or infectious. Per Fleischner Society Guidelines, recommend a non-contrast Chest CT at 6-12 months. If patient is high risk for malignancy, consider an additional non-contrast Chest CT at 18-24 months. If patient is low risk for malignancy, non-contrast Chest CT at 18-24 months is optional. These guidelines do not apply to immunocompromised patients and patients with cancer. Follow up in patients with significant comorbidities as clinically warranted. For lung cancer screening, adhere to Lung-RADS guidelines. Reference:  Radiology. 2017; 284(1):228-43. 3. Bilateral gynecomastia. 4. Subacute healing proximal left humeral fracture. Electronically Signed   By: Sharlet Salina M.D.   On: 09/22/2022 14:54   DG Chest Port 1 View  Result Date: 09/22/2022 CLINICAL DATA:  Shortness of breath and bilateral leg swelling. EXAM: PORTABLE CHEST 1 VIEW COMPARISON:  April 05, 2022 FINDINGS: The heart size and mediastinal contours are within normal limits. There is no evidence of acute infiltrate, pleural effusion or pneumothorax. The visualized skeletal structures are unremarkable. IMPRESSION: No active cardiopulmonary disease. Electronically Signed   By: Aram Candela M.D.   On: 09/22/2022 13:44           LOS: 0 days   Time spent= 35 mins    Jaray Boliver Joline Maxcy, MD [facility]  If 7PM-7AM, please contact night-coverage  09/23/2022, 8:16 AM

## 2022-09-23 NOTE — Progress Notes (Signed)
PHARMACY CONSULT NOTE   Pharmacy Consult for Electrolyte Monitoring and Replacement   Recent Labs: Potassium (mmol/L)  Date Value  09/23/2022 4.3  09/06/2014 4.4   Calcium (mg/dL)  Date Value  40/98/1191 8.5 (L)   Calcium, Total (mg/dL)  Date Value  47/82/9562 7.9 (L)   Albumin (g/dL)  Date Value  13/12/6576 4.0  07/31/2021 4.4  09/06/2014 2.8 (L)   Sodium (mmol/L)  Date Value  09/23/2022 135  07/31/2021 138  09/06/2014 138     Assessment: 42 year old male admitted with CAP vs asthma/COPD exacerbation and acute heart failure. PMH includes asthma, COPD, chronic hepatitis C, substance use disorder, history of balanitis, hypertension.  Renal function consistent with baseline  Goal of Therapy:  Electrolytes within normal limits  Plan:  No replacement warranted  Follow up BMP and Mag tomorrow AM    Elliot Gurney, PharmD, BCPS Clinical Pharmacist  09/23/2022 9:00 AM

## 2022-09-23 NOTE — Evaluation (Signed)
Occupational Therapy Evaluation Patient Details Name: Nathan Vasquez MRN: [id number] DOB: 22-Aug-1980 Today's Date: 09/23/2022   History of Present Illness Nathan Vasquez is a 42 year old with history of moderate persistent asthma, tobacco use, hepatitis C with SVR, HTN, substance abuse, chronic lower extremity edema, bipolar disorder, L humeral fx comes to the hospital complains of lower extremity swelling, generalized fatigue, dyspnea on exertion.   Clinical Impression   Nathan Vasquez was seen for OT evaluation this date. Pt received in room bathroom engaging in wash up. He declines functional deficits or limitations at this time and endorses baseline level of independence to perform functional mobility, toileting, grooming, etc. Of note, pt with recent humeral fx per chart/pt report is is NWB through his LUE and is to have a sling on at all times. No sling in room at time of OT evaluation. Pt endorses non-compliance with sling use and states he typically does not wear it at home, but is following up with his doctor regularly and had begun PT for rehab of his arm. Educated on importance of compliance with MD orders for fracture healing and rehab and pt encouraged to have family bring his sling for use while in the hospital. No further skilled OT needs identified. Will sign off at this time. Please re-consult if additional OT needs arise during this hospital stay.      Recommendations for follow up therapy are one component of a multi-disciplinary discharge planning process, led by the attending physician.  Recommendations may be updated based on patient status, additional functional criteria and insurance authorization.   Assistance Recommended at Discharge PRN  Patient can return home with the following      Functional Status Assessment  Patient has not had a recent decline in their functional status  Equipment Recommendations  None recommended by OT    Recommendations for  Other Services       Precautions / Restrictions Precautions Precautions: Fall Restrictions Weight Bearing Restrictions: No      Mobility Bed Mobility Overal bed mobility: Independent                  Transfers Overall transfer level: Independent                        Balance Overall balance assessment: No apparent balance deficits (not formally assessed)                                         ADL either performed or assessed with clinical judgement   ADL Overall ADL's : At baseline                                       General ADL Comments: Pt up ad lib in room. He reports performing grooming, toileting, and functional mobility at baseline level. No strength, sensory, or acute functional deficits appreciated.     Vision Patient Visual Report: No change from baseline       Perception     Praxis      Pertinent Vitals/Pain Pain Assessment Pain Assessment: No/denies pain     Hand Dominance Right   Extremity/Trunk Assessment Upper Extremity Assessment Upper Extremity Assessment: LUE deficits/detail LUE Deficits / Details: Pt reports recent humeral fx (~ 8 weeks PTA) and non-compliance  with sling use and does not have sling in room with him. He states he has been following up with [facility] for care. Is still NWB per his most recent MD visit. Very limited AROM appreciated as pt reports pain with any active shoulder flexion/mobility. Strongly encouraged pt continue to follow MD recommendations for follow up therapy and sling use.           Communication Communication Communication: No difficulties   Cognition Arousal/Alertness: Awake/alert Behavior During Therapy: WFL for tasks assessed/performed, Impulsive Overall Cognitive Status: Within Functional Limits for tasks assessed                                       General Comments  Pt recieved on RA with spO2 of 86% HR 87. Educated on  importance of O2 use even while in bathroom/bathing. O2 extender added to tubing to support pt adherence with Nathan Vasquez wear while getting OOB. SpO2 93%  with pt on 2L [place] at end of session.    Exercises Other Exercises Other Exercises: Pt educated on falls prevention, energy conservation, O2 safety, DC rec and role of OT in acute setting.   Shoulder Instructions      Home Living Family/patient expects to be discharged to:: Private residence Living Arrangements: Parent Available Help at Discharge: Family;Available PRN/intermittently Type of Home: Apartment Home Access: Stairs to enter Entrance Stairs-Number of Steps: 2nd floor apartment   Home Layout: One level     Bathroom Shower/Tub: Tub/shower unit         Home Equipment: None          Prior Functioning/Environment Prior Level of Function : Independent/Modified Independent             Mobility Comments: 1 fall in last six months.          OT Problem List: Cardiopulmonary status limiting activity;Decreased range of motion;Decreased safety awareness      OT Treatment/Interventions:      OT Goals(Current goals can be found in the care plan section) Acute Rehab OT Goals Patient Stated Goal: To go home OT Goal Formulation: With patient Time For Goal Achievement: 10/07/22 Potential to Achieve Goals: Good  OT Frequency:      Co-evaluation              AM-PAC OT "6 Clicks" Daily Activity     Outcome Measure Help from another person eating meals?: None Help from another person taking care of personal grooming?: None Help from another person toileting, which includes using toliet, bedpan, or urinal?: None Help from another person bathing (including washing, rinsing, drying)?: None Help from another person to put on and taking off regular upper body clothing?: None Help from another person to put on and taking off regular lower body clothing?: None 6 Click Score: 24   End of Session Equipment Utilized During  Treatment: Oxygen Nurse Communication: Mobility status;Other (comment) (Pt O2 sats during session.)  Activity Tolerance: Patient tolerated treatment well Patient left: in bed;with family/visitor present;with call bell/phone within reach  OT Visit Diagnosis: Other abnormalities of gait and mobility (R26.89)                Time: 4782-9562 OT Time Calculation (min): 14 min Charges:  OT General Charges $OT Visit: 1 Visit OT Evaluation $OT Eval Low Complexity: 1 Low  Rockney Ghee, M.S., OTR/L 09/23/22, 3:15 PM

## 2022-09-23 NOTE — Progress Notes (Signed)
*  PRELIMINARY RESULTS* Echocardiogram 2D Echocardiogram has been performed.  Cristela Blue 09/23/2022, 8:33 AM

## 2022-09-24 ENCOUNTER — Other Ambulatory Visit: Payer: Self-pay

## 2022-09-24 LAB — RESPIRATORY PANEL BY PCR

## 2022-09-24 LAB — CBC
HCT: 42.6 % (ref 39.0–52.0)
Hemoglobin: 13.5 g/dL (ref 13.0–17.0)
MCH: 27.8 pg (ref 26.0–34.0)
MCHC: 31.7 g/dL (ref 30.0–36.0)
MCV: 87.7 fL (ref 80.0–100.0)
Platelets: 242 10*3/uL (ref 150–400)
RBC: 4.86 MIL/uL (ref 4.22–5.81)
RDW: 14.8 % (ref 11.5–15.5)
WBC: 13.7 10*3/uL — ABNORMAL HIGH (ref 4.0–10.5)
nRBC: 0 % (ref 0.0–0.2)

## 2022-09-24 LAB — BASIC METABOLIC PANEL
Anion gap: 7 (ref 5–15)
BUN: 15 mg/dL (ref 6–20)
CO2: 31 mmol/L (ref 22–32)
Calcium: 8.2 mg/dL — ABNORMAL LOW (ref 8.9–10.3)
Chloride: 96 mmol/L — ABNORMAL LOW (ref 98–111)
Creatinine, Ser: 0.68 mg/dL (ref 0.61–1.24)
GFR, Estimated: >60 mL/min (ref >60)
Glucose, Bld: 83 mg/dL (ref 70–99)
Potassium: 3.6 mmol/L (ref 3.5–5.1)
Sodium: 134 mmol/L — ABNORMAL LOW (ref 135–145)

## 2022-09-24 LAB — CULTURE, BLOOD (ROUTINE X 2): Special Requests: ADEQUATE

## 2022-09-24 LAB — MAGNESIUM: Magnesium: 2.2 mg/dL (ref 1.7–2.4)

## 2022-09-24 MED ORDER — IPRATROPIUM-ALBUTEROL 0.5-2.5 (3) MG/3ML IN SOLN
3.0000 mL | Freq: Four times a day (QID) | RESPIRATORY_TRACT | 0 refills | Status: DC | PRN
  Filled 2022-09-24: qty 360, 30d supply, fill #0

## 2022-09-24 MED ORDER — POTASSIUM CHLORIDE CRYS ER 20 MEQ PO TBCR
40.0000 meq | EXTENDED_RELEASE_TABLET | Freq: Once | ORAL | Status: AC
  Administered 2022-09-24: 40 meq via ORAL
  Filled 2022-09-24: qty 2

## 2022-09-24 MED ORDER — PREDNISONE 50 MG PO TABS
50.0000 mg | ORAL_TABLET | Freq: Every day | ORAL | 0 refills | Status: AC
  Filled 2022-09-24: qty 3, 3d supply, fill #0

## 2022-09-24 MED ORDER — ALBUTEROL SULFATE HFA 108 (90 BASE) MCG/ACT IN AERS
2.0000 | INHALATION_SPRAY | Freq: Four times a day (QID) | RESPIRATORY_TRACT | 2 refills | Status: DC | PRN
  Filled 2022-09-24: qty 18, 30d supply, fill #0

## 2022-09-24 MED ORDER — ALBUTEROL SULFATE HFA 108 (90 BASE) MCG/ACT IN AERS
1.0000 | INHALATION_SPRAY | Freq: Four times a day (QID) | RESPIRATORY_TRACT | 1 refills | Status: DC | PRN
Start: 2022-09-24 — End: 2023-01-10
  Filled 2022-09-24 – 2022-09-30 (×2): qty 18, 30d supply, fill #0

## 2022-09-24 NOTE — TOC Initial Note (Signed)
Transition of Care [facility]) - Initial/Assessment Note    Patient Details  Name: Nathan Vasquez MRN: [id number] Date of Birth: [birth year]  Transition of Care [facility]) CM/SW Contact:    Allena Katz, LCSW Phone Number: 09/24/2022, 10:33 AM  Clinical Narrative:       CSW spoke with pt to complete readmission screen. Pt currently living with his mom and unable to work. Pt states that he does not have any income. [facility] but since he has [facility] has not went anywhere else. CSW provided pt list of Primary care doctors and advised which may be the quickest to see him. Pt reports that he uses medicaid transport to get around. Pt states he does not get food stamps due to previous charges he has legally that prevents him from qualifying. Pt interested in food bank resources. Pt not interested in substance use resources as he reports he is connected with the Methadone clinic in [place], [place]. Pt is interested in getting a nebulizer. CSW has ordered this to be delivered to patients room through [employer].              Expected Discharge Plan: Home/Self Care     Patient Goals and CMS Choice Patient states their goals for this hospitalization and ongoing recovery are:: Return home          Expected Discharge Plan and Services       Living arrangements for the past 2 months: Single Family Home Expected Discharge Date: 09/24/22               DME Arranged: Nebulizer machine   Date DME Agency Contacted: 09/24/22                Prior Living Arrangements/Services Living arrangements for the past 2 months: Single Family Home Lives with:: Self, Parents   Do you feel safe going back to the place where you live?: Yes               Activities of Daily Living Home Assistive Devices/Equipment: Eyeglasses ADL Screening (condition at time of admission) Patient's cognitive ability adequate to safely complete daily activities?: Yes Is the  patient deaf or have difficulty hearing?: No Does the patient have difficulty seeing, even when wearing glasses/contacts?: No Does the patient have difficulty concentrating, remembering, or making decisions?: No Patient able to express need for assistance with ADLs?: Yes Does the patient have difficulty dressing or bathing?: No Independently performs ADLs?: Yes (appropriate for developmental age) Does the patient have difficulty walking or climbing stairs?: No Weakness of Legs: None Weakness of Arms/Hands: None  Permission Sought/Granted            Permission granted to share info w Relationship: DME company     Emotional Assessment Appearance:: Appears older than stated age Attitude/Demeanor/Rapport: Apprehensive   Orientation: : Oriented to Self, Oriented to Place, Oriented to  Time, Oriented to Situation      Admission diagnosis:  Peripheral edema [R60.0] AKI (acute kidney injury) (HCC) [N17.9] Community acquired pneumonia, unspecified laterality [J18.9] Acute hypoxic respiratory failure (HCC) [J96.01] Patient Active Problem List   Diagnosis Date Noted   AKI (acute kidney injury) (HCC) 09/23/2022   Acute hypoxic respiratory failure (HCC) 09/22/2022   Acute heart failure (HCC) 09/22/2022   Opioid use disorder 09/22/2022   Hypoxia 04/10/2022   Bilateral leg edema 04/10/2022   Right leg pain 12/11/2021   Cough 09/11/2021   Shortness of breath 08/14/2021  History of gastroesophageal reflux (GERD) 08/14/2021   Chronic pain of right knee 08/14/2021   Wheezing 08/02/2021   Essential hypertension 07/31/2021   Encounter to establish care 07/31/2021   Blurry vision 07/31/2021   Chronic hepatitis C without hepatic coma (HCC) 06/28/2021   Asthma 10/28/2019   Left knee pain 10/28/2019   Smoking 10/28/2019   Generalized anxiety disorder 09/28/2019   Complaint of debility and malaise 08/04/2016   Mild intermittent asthma 08/04/2016   Opiate overdose (HCC) 08/04/2016    PCP:  Oneita Hurt, No Pharmacy:   [facility] [address] [place] [address] Phone: [phone] Fax: [phone]     Social Determinants of Health (SDOH) Social History: SDOH Screenings   Food Insecurity: Food Insecurity Present (04/10/2022)  Housing: Low Risk  (04/10/2022)  Transportation Needs: Unmet Transportation Needs (04/10/2022)  Utilities: Not At Risk (04/10/2022)  Alcohol Screen: Low Risk  (04/10/2022)  Depression (PHQ2-9): High Risk (12/31/2021)  Financial Resource Strain: High Risk (04/10/2022)  Physical Activity: Inactive (04/10/2022)  Social Connections: Socially Isolated (04/10/2022)  Stress: Stress Concern Present (04/10/2022)  Tobacco Use: High Risk (09/23/2022)   SDOH Interventions:     Readmission Risk Interventions    09/24/2022   10:33 AM  Readmission Risk Prevention Plan  Transportation Screening Complete  PCP or Specialist Appt within 3-5 Days Complete  HRI or Home Care Consult Complete  Medication Review (RN Care Manager) Complete

## 2022-09-24 NOTE — Discharge Instructions (Signed)
Some PCP options in [place] area- not a comprehensive list  [facility]- [phone] [place] [facility]- [phone] [facility]- [phone] [facility]- [phone] [facility]- [phone] [place]- [phone]  or [facility] [phone]  

## 2022-09-24 NOTE — Progress Notes (Signed)
PHARMACY CONSULT NOTE   Pharmacy Consult for Electrolyte Monitoring and Replacement   Recent Labs: Potassium (mmol/L)  Date Value  09/24/2022 3.6  09/06/2014 4.4   Magnesium (mg/dL)  Date Value  16/02/9603 2.2   Calcium (mg/dL)  Date Value  54/01/8118 8.2 (L)   Calcium, Total (mg/dL)  Date Value  14/78/2956 7.9 (L)   Albumin (g/dL)  Date Value  21/30/8657 4.0  07/31/2021 4.4  09/06/2014 2.8 (L)   Sodium (mmol/L)  Date Value  09/24/2022 134 (L)  07/31/2021 138  09/06/2014 138     Assessment: 42 year old male admitted with CAP vs asthma/COPD exacerbation and acute heart failure. PMH includes asthma, COPD, chronic hepatitis C, substance use disorder, history of balanitis, hypertension.  Renal function consistent with baseline  Goal of Therapy:  Electrolytes within normal limits  Plan:  Slight downtrend in serum sodium and potassium. Continue to monitor. No replacement warranted  Follow up BMP and Mag tomorrow AM   Elliot Gurney, PharmD, BCPS Clinical Pharmacist  09/24/2022 7:28 AM

## 2022-09-24 NOTE — Discharge Summary (Signed)
Physician Discharge Summary  Nathan Vasquez ZOX:[id number] DOB: 1980/07/08 DOA: 09/22/2022  PCP: Pcp, No  Admit date: 09/22/2022 Discharge date: 09/24/2022  Admitted From: Home Disposition:  Home  Recommendations for Outpatient Follow-up:  Follow up with PCP in 1-2 weeks Please obtain BMP/CBC in one week your next doctors visit.     Home Health: Equipment/Devices: Discharge Condition: Stable CODE STATUS:  Diet recommendation:   Brief/Interim Summary:  42 year old with history of moderate persistent asthma, tobacco use, hepatitis C with SVR, HTN, substance abuse, chronic lower extremity edema, bipolar disorder comes to the hospital complains of lower extremity swelling, generalized fatigue, dyspnea on exertion.  Currently follows at outpatient methadone clinic but recently use fentanyl.  Upon arrival noted to be hypoxic requiring 6 L nasal cannula, CTA chest was negative for PE but showed bilateral nodular opacity.  Patient was started on azithromycin, Rocephin and diuretics.   Assessment & Plan:  Principal Problem:   Acute hypoxic respiratory failure (HCC) Active Problems:   Acute heart failure (HCC)   Opioid use disorder   Essential hypertension   Generalized anxiety disorder       Assessment and Plan: * Acute hypoxic respiratory failure (HCC) Bilateral pulmonary nodules Breathing at baseline. BNP minimally elevated.  Procalcitonin is negative.  Suspicion for infection is low therefore we will discontinue Rocephin.  We can empirically continue azithromycin for asthma/COPD (Undx) treatment. Nebs, Inhaler prescribed. Prednisone for 3 more days.  -Steroids, bronchodilators scheduled and as needed -COVID-negative.  Full respiratory panel-Neg   Elevated BNP Received Lasix, now euvolemic.  Follow-up outpatient PCP.  No need for outpatient diuretics at this time - Echocardiogram-Preserved ef   Opioid use disorder On methadone 80 mg daily but recently still use fentanyl    Essential hypertension Supposed to be on amlodipine but patient has been noncompliant   Generalized anxiety disorder - Follow-up outpatient   Subacute healing proximal left humeral fracture  -From fall, follows outpatient [facility].      Consultations: None  Subjective: Feels great, ambulating in the hallway without any shortness of breath.  Back to his baseline  Discharge Exam: Vitals:   09/24/22 0700 09/24/22 0856  BP:  113/74  Pulse:  73  Resp:  16  Temp:  98.1 F (36.7 C)  SpO2: 92% 94%   Vitals:   09/23/22 2346 09/24/22 0404 09/24/22 0700 09/24/22 0856  BP: (!) 141/86 129/85  113/74  Pulse: 63 76  73  Resp: 17 19  16   Temp: 98 F (36.7 C) 98 F (36.7 C)  98.1 F (36.7 C)  TempSrc:  Oral    SpO2: 96% 97% 92% 94%  Weight:      Height:        General: Pt is alert, awake, not in acute distress Cardiovascular: RRR, S1/S2 +, no rubs, no gallops Respiratory: CTA bilaterally, no wheezing, no rhonchi Abdominal: Soft, NT, ND, bowel sounds + Extremities: no edema, no cyanosis  Discharge Instructions   Allergies as of 09/24/2022       Reactions   Penicillins Rash        Medication List     TAKE these medications    albuterol 108 (90 Base) MCG/ACT inhaler Commonly known as: Ventolin HFA Inhale 1-2 puffs into the lungs every 6 (six) hours as needed for wheezing or shortness of breath. What changed: Another medication with the same name was added. Make sure you understand how and when to take each.   albuterol 108 (90 Base) MCG/ACT inhaler Commonly  known as: VENTOLIN HFA Inhale 2 puffs into the lungs every 6 (six) hours as needed for wheezing or shortness of breath. What changed: You were already taking a medication with the same name, and this prescription was added. Make sure you understand how and when to take each.   amLODipine 10 MG tablet Commonly known as: NORVASC Take 1 tablet (10 mg total) by mouth once daily.   fluticasone-salmeterol  500-50 MCG/ACT Aepb Commonly known as: Advair Diskus Inhale 2 puffs into the lungs in the morning and at bedtime. May decrease to 1 puff in the morning and at bedtime after a couple of weeks depending on symptoms.   gabapentin 100 MG capsule Commonly known as: NEURONTIN Take 1 capsule (100 mg total) by mouth 3 (three) times daily.   HYDROcodone-acetaminophen 5-325 MG tablet Commonly known as: NORCO/VICODIN Take 1 tablet by mouth every 6 (six) hours as needed for moderate pain.   ipratropium-albuterol 0.5-2.5 (3) MG/3ML Soln Commonly known as: DUONEB Take 3 mLs by nebulization every 6 (six) hours as needed.   meloxicam 7.5 MG tablet Commonly known as: Mobic Take 1 tablet (7.5 mg total) by mouth daily.   methadone 10 MG/ML solution Commonly known as: DOLOPHINE Take 80 mg by mouth daily.   methocarbamol 750 MG tablet Commonly known as: ROBAXIN Take 750 mg by mouth 3 (three) times daily as needed for muscle spasms.   montelukast 10 MG tablet Commonly known as: SINGULAIR Take 1 tablet (10 mg total) by mouth once nightly at bedtime.   omeprazole 20 MG capsule Commonly known as: PRILOSEC Take 1 capsule (20 mg total) by mouth once daily.   predniSONE 50 MG tablet Commonly known as: DELTASONE Take 1 tablet (50 mg total) by mouth daily for 3 days.               Durable Medical Equipment  (From admission, onward)           Start     Ordered   09/24/22 0752  For home use only DME Nebulizer machine  Once       Question Answer Comment  Patient needs a nebulizer to treat with the following condition Asthma   Length of Need Lifetime      09/24/22 0751            Allergies  Allergen Reactions   Penicillins Rash    You were cared for by a hospitalist during your hospital stay. If you have any questions about your discharge medications or the care you received while you were in the hospital after you are discharged, you can call the unit and asked to speak with  the hospitalist on call if the hospitalist that took care of you is not available. Once you are discharged, your primary care physician will handle any further medical issues. Please note that no refills for any discharge medications will be authorized once you are discharged, as it is imperative that you return to your primary care physician (or establish a relationship with a primary care physician if you do not have one) for your aftercare needs so that they can reassess your need for medications and monitor your lab values.  You were cared for by a hospitalist during your hospital stay. If you have any questions about your discharge medications or the care you received while you were in the hospital after you are discharged, you can call the unit and asked to speak with the hospitalist on call if the hospitalist that  took care of you is not available. Once you are discharged, your primary care physician will handle any further medical issues. Please note that NO REFILLS for any discharge medications will be authorized once you are discharged, as it is imperative that you return to your primary care physician (or establish a relationship with a primary care physician if you do not have one) for your aftercare needs so that they can reassess your need for medications and monitor your lab values.  Please request your Prim.MD to go over all Hospital Tests and Procedure/Radiological results at the follow up, please get all Hospital records sent to your Prim MD by signing hospital release before you go home.  Get CBC, CMP, 2 view Chest X ray checked  by Primary MD during your next visit or SNF MD in 5-7 days ( we routinely change or add medications that can affect your baseline labs and fluid status, therefore we recommend that you get the mentioned basic workup next visit with your PCP, your PCP may decide not to get them or add new tests based on their clinical decision)  On your next visit with your primary  care physician please Get Medicines reviewed and adjusted.  If you experience worsening of your admission symptoms, develop shortness of breath, life threatening emergency, suicidal or homicidal thoughts you must seek medical attention immediately by calling 911 or calling your MD immediately  if symptoms less severe.  You Must read complete instructions/literature along with all the possible adverse reactions/side effects for all the Medicines you take and that have been prescribed to you. Take any new Medicines after you have completely understood and accpet all the possible adverse reactions/side effects.   Do not drive, operate heavy machinery, perform activities at heights, swimming or participation in water activities or provide baby sitting services if your were admitted for syncope or siezures until you have seen by Primary MD or a Neurologist and advised to do so again.  Do not drive when taking Pain medications.   Procedures/Studies: ECHOCARDIOGRAM COMPLETE  Result Date: 09/23/2022    ECHOCARDIOGRAM REPORT   Patient Name:   Nathan Vasquez Date of Exam: 09/23/2022 Medical Rec #:  [id number]              Height:       72.0 in Accession #:    [id number]             Weight:       199.3 lb Date of Birth:  01/01/1981              BSA:          2.127 m Patient Age:    42 years               BP:           122/77 mmHg Patient Gender: M                      HR:           60 bpm. Exam Location:  [facility] Procedure: 2D Echo, Color Doppler and Cardiac Doppler Indications:     CHF I50.9  History:         Patient has no prior history of Echocardiogram examinations.                  COPD; Risk Factors:Hypertension. Herion abuse.  Sonographer:     Cristela Blue Referring Phys:  [id number] Cira Servant  BASARABA Diagnosing Phys: Lorine Bears MD IMPRESSIONS  1. Left ventricular ejection fraction, by estimation, is 55 to 60%. The left ventricle has normal function. The left ventricle has no regional wall motion  abnormalities. Left ventricular diastolic parameters were normal.  2. Right ventricular systolic function is normal. The right ventricular size is normal. Tricuspid regurgitation signal is inadequate for assessing PA pressure.  3. The mitral valve is normal in structure. No evidence of mitral valve regurgitation. No evidence of mitral stenosis.  4. The aortic valve is normal in structure. Aortic valve regurgitation is not visualized. No aortic stenosis is present.  5. The inferior vena cava is dilated in size with >50% respiratory variability, suggesting right atrial pressure of 8 mmHg. FINDINGS  Left Ventricle: Left ventricular ejection fraction, by estimation, is 55 to 60%. The left ventricle has normal function. The left ventricle has no regional wall motion abnormalities. The left ventricular internal cavity size was normal in size. There is  no left ventricular hypertrophy. Left ventricular diastolic parameters were normal. Right Ventricle: The right ventricular size is normal. No increase in right ventricular wall thickness. Right ventricular systolic function is normal. Tricuspid regurgitation signal is inadequate for assessing PA pressure. Left Atrium: Left atrial size was normal in size. Right Atrium: Right atrial size was normal in size. Pericardium: There is no evidence of pericardial effusion. Mitral Valve: The mitral valve is normal in structure. No evidence of mitral valve regurgitation. No evidence of mitral valve stenosis. MV peak gradient, 5.5 mmHg. The mean mitral valve gradient is 3.0 mmHg. Tricuspid Valve: The tricuspid valve is normal in structure. Tricuspid valve regurgitation is not demonstrated. No evidence of tricuspid stenosis. Aortic Valve: The aortic valve is normal in structure. Aortic valve regurgitation is not visualized. No aortic stenosis is present. Aortic valve mean gradient measures 3.5 mmHg. Aortic valve peak gradient measures 6.5 mmHg. Aortic valve area, by VTI measures 2.88  cm. Pulmonic Valve: The pulmonic valve was normal in structure. Pulmonic valve regurgitation is not visualized. No evidence of pulmonic stenosis. Aorta: The aortic root is normal in size and structure. Venous: The inferior vena cava is dilated in size with greater than 50% respiratory variability, suggesting right atrial pressure of 8 mmHg. IAS/Shunts: No atrial level shunt detected by color flow Doppler.  LEFT VENTRICLE PLAX 2D LVIDd:         5.10 cm   Diastology LVIDs:         3.40 cm   LV e' medial:    9.46 cm/s LV PW:         0.90 cm   LV E/e' medial:  9.5 LV IVS:        0.90 cm   LV e' lateral:   15.60 cm/s LVOT diam:     2.00 cm   LV E/e' lateral: 5.7 LV SV:         72 LV SV Index:   34 LVOT Area:     3.14 cm  RIGHT VENTRICLE RV Basal diam:  4.00 cm RV Mid diam:    2.60 cm RV S prime:     21.30 cm/s TAPSE (M-mode): 2.9 cm LEFT ATRIUM           Index        RIGHT ATRIUM           Index LA diam:      2.30 cm 1.08 cm/m   RA Area:     13.60 cm LA Vol (A4C): 34.2 ml 16.08  ml/m  RA Volume:   33.10 ml  15.56 ml/m  AORTIC VALVE AV Area (Vmax):    2.80 cm AV Area (Vmean):   2.80 cm AV Area (VTI):     2.88 cm AV Vmax:           127.00 cm/s AV Vmean:          87.800 cm/s AV VTI:            0.250 m AV Peak Grad:      6.5 mmHg AV Mean Grad:      3.5 mmHg LVOT Vmax:         113.00 cm/s LVOT Vmean:        78.300 cm/s LVOT VTI:          0.229 m LVOT/AV VTI ratio: 0.92  AORTA Ao Root diam: 2.90 cm MITRAL VALVE               TRICUSPID VALVE MV Area (PHT): 4.10 cm    TR Peak grad:   31.8 mmHg MV Area VTI:   2.78 cm    TR Vmax:        282.00 cm/s MV Peak grad:  5.5 mmHg MV Mean grad:  3.0 mmHg    SHUNTS MV Vmax:       1.17 m/s    Systemic VTI:  0.23 m MV Vmean:      81.6 cm/s   Systemic Diam: 2.00 cm MV Decel Time: 185 msec MV E velocity: 89.60 cm/s MV A velocity: 83.80 cm/s MV E/A ratio:  1.07 Lorine Bears MD Electronically signed by Lorine Bears MD Signature Date/Time: 09/23/2022/3:07:44 PM    Final    CT Angio  Chest PE W and/or Wo Contrast  Result Date: 09/22/2022 CLINICAL DATA:  Short of breath, bilateral lower extremity edema EXAM: CT ANGIOGRAPHY CHEST WITH CONTRAST TECHNIQUE: Multidetector CT imaging of the chest was performed using the standard protocol during bolus administration of intravenous contrast. Multiplanar CT image reconstructions and MIPs were obtained to evaluate the vascular anatomy. RADIATION DOSE REDUCTION: This exam was performed according to the departmental dose-optimization program which includes automated exposure control, adjustment of the mA and/or kV according to patient size and/or use of iterative reconstruction technique. CONTRAST:  75mL OMNIPAQUE IOHEXOL 350 MG/ML SOLN COMPARISON:  09/05/2014, 09/22/2022 FINDINGS: Cardiovascular: There is technically adequate opacification of the central and segmental branches of the pulmonary vasculature. There are no filling defects or pulmonary emboli. The heart is unremarkable without pericardial effusion. No evidence of thoracic aortic aneurysm or dissection. Mediastinum/Nodes: Stable borderline enlarged mediastinal and hilar lymph nodes, which remain nonspecific. Largest in the AP window measures up to 13 mm reference image 56/4, previously measuring 12 mm. Thyroid gland, trachea, and esophagus demonstrate no significant findings. Lungs/Pleura: There are some scattered areas of nodular consolidation seen bilaterally, within the periphery of the right upper lobe reference image 42/6, within the periphery of the lingular segment of the left upper lobe reference image 104/6, and within the Peri fissural right lower lobe reference image 90/6. Possible associated discrete nodule within the right lower lobe reference image 91/6 measuring 8 mm. The appearance is most suggestive of underlying inflammatory or infectious etiology. No effusion or pneumothorax.  The central airways are patent. Upper Abdomen: No acute abnormality. Musculoskeletal: Bilateral  gynecomastia. Subacute proximal left humeral fracture with mild callus formation. No acute or destructive bony lesions. Reconstructed images demonstrate no additional findings. Review of the MIP images confirms the above findings. IMPRESSION: 1.  No evidence of pulmonary embolus. 2. Scattered bilateral nodular airspace disease, with possible discrete nodule in the right lower lobe measuring up to 8 mm. Findings are likely inflammatory or infectious. Per Fleischner Society Guidelines, recommend a non-contrast Chest CT at 6-12 months. If patient is high risk for malignancy, consider an additional non-contrast Chest CT at 18-24 months. If patient is low risk for malignancy, non-contrast Chest CT at 18-24 months is optional. These guidelines do not apply to immunocompromised patients and patients with cancer. Follow up in patients with significant comorbidities as clinically warranted. For lung cancer screening, adhere to Lung-RADS guidelines. Reference: Radiology. 2017; 284(1):228-43. 3. Bilateral gynecomastia. 4. Subacute healing proximal left humeral fracture. Electronically Signed   By: Sharlet Salina M.D.   On: 09/22/2022 14:54   DG Chest Port 1 View  Result Date: 09/22/2022 CLINICAL DATA:  Shortness of breath and bilateral leg swelling. EXAM: PORTABLE CHEST 1 VIEW COMPARISON:  April 05, 2022 FINDINGS: The heart size and mediastinal contours are within normal limits. There is no evidence of acute infiltrate, pleural effusion or pneumothorax. The visualized skeletal structures are unremarkable. IMPRESSION: No active cardiopulmonary disease. Electronically Signed   By: Aram Candela M.D.   On: 09/22/2022 13:44     The results of significant diagnostics from this hospitalization (including imaging, microbiology, ancillary and laboratory) are listed below for reference.     Microbiology: Recent Results (from the past 240 hour(s))  Blood culture (routine x 2)     Status: None (Preliminary result)    Collection Time: 09/22/22  1:43 PM   Specimen: BLOOD  Result Value Ref Range Status   Specimen Description BLOOD BLOOD RIGHT ARM  Final   Special Requests   Final    BOTTLES DRAWN AEROBIC AND ANAEROBIC Blood Culture adequate volume   Culture   Final    NO GROWTH 2 DAYS Performed at [facility], [address], [place], [place] [address]    Report Status PENDING  Incomplete  Blood culture (routine x 2)     Status: None (Preliminary result)   Collection Time: 09/22/22  1:43 PM   Specimen: BLOOD  Result Value Ref Range Status   Specimen Description BLOOD BLOOD LEFT ARM  Final   Special Requests   Final    BOTTLES DRAWN AEROBIC AND ANAEROBIC Blood Culture adequate volume   Culture   Final    NO GROWTH 2 DAYS Performed at [facility], [address] [place], [place] [address]    Report Status PENDING  Incomplete  SARS Coronavirus 2 by RT PCR (hospital order, performed in [facility] hospital lab) *cepheid single result test* Anterior Nasal Swab     Status: None   Collection Time: 09/22/22  3:59 PM   Specimen: Anterior Nasal Swab  Result Value Ref Range Status   SARS Coronavirus 2 by RT PCR NEGATIVE NEGATIVE Final    Comment: (NOTE) SARS-CoV-2 target nucleic acids are NOT DETECTED.  The SARS-CoV-2 RNA is generally detectable in upper and lower respiratory specimens during the acute phase of infection. The lowest concentration of SARS-CoV-2 viral copies this assay can detect is 250 copies / mL. A negative result does not preclude SARS-CoV-2 infection and should not be used as the sole basis for treatment or other patient management decisions.  A negative result may occur with improper specimen collection / handling, submission of specimen other than nasopharyngeal swab, presence of viral mutation(s) within the areas targeted by this assay, and inadequate number of viral copies (<  250 copies / mL). A negative result must be combined with clinical observations,  patient history, and epidemiological information.  Fact Sheet for Patients:   RoadLapTop.co.za  Fact Sheet for Healthcare Providers: http://kim-miller.com/  This test is not yet approved or  cleared by the [place] FDA and has been authorized for detection and/or diagnosis of SARS-CoV-2 by FDA under an Emergency Use Authorization (EUA).  This EUA will remain in effect (meaning this test can be used) for the duration of the COVID-19 declaration under Section 564(b)(1) of the Act, 21 U.S.C. section 360bbb-3(b)(1), unless the authorization is terminated or revoked sooner.  Performed at [facility], [address] [place], [place] [address]   Respiratory (~20 pathogens) panel by PCR     Status: None   Collection Time: 09/23/22  6:20 PM   Specimen: Anterior Nasal Swab; Respiratory  Result Value Ref Range Status   Adenovirus NOT DETECTED NOT DETECTED Final   Coronavirus 229E NOT DETECTED NOT DETECTED Final    Comment: (NOTE) The Coronavirus on the Respiratory Panel, DOES NOT test for the novel  Coronavirus (2019 nCoV)    Coronavirus HKU1 NOT DETECTED NOT DETECTED Final   Coronavirus NL63 NOT DETECTED NOT DETECTED Final   Coronavirus OC43 NOT DETECTED NOT DETECTED Final   Metapneumovirus NOT DETECTED NOT DETECTED Final   Rhinovirus / Enterovirus NOT DETECTED NOT DETECTED Final   Influenza A NOT DETECTED NOT DETECTED Final   Influenza B NOT DETECTED NOT DETECTED Final   Parainfluenza Virus 1 NOT DETECTED NOT DETECTED Final   Parainfluenza Virus 2 NOT DETECTED NOT DETECTED Final   Parainfluenza Virus 3 NOT DETECTED NOT DETECTED Final   Parainfluenza Virus 4 NOT DETECTED NOT DETECTED Final   Respiratory Syncytial Virus NOT DETECTED NOT DETECTED Final   Bordetella pertussis NOT DETECTED NOT DETECTED Final   Bordetella Parapertussis NOT DETECTED NOT DETECTED Final   Chlamydophila pneumoniae NOT DETECTED NOT DETECTED Final    Mycoplasma pneumoniae NOT DETECTED NOT DETECTED Final    Comment: Performed at [facility], [address], [place], [place] [address]     Labs: BNP (last 3 results) Recent Labs    09/22/22 1335  BNP 125.1*   Basic Metabolic Panel: Recent Labs  Lab 09/22/22 1316 09/23/22 0454 09/24/22 0559  NA 138 135 134*  K 4.3 4.3 3.6  CL 96* 94* 96*  CO2 33* 34* 31  GLUCOSE 109* 185* 83  BUN 8 13 15   CREATININE 0.81 0.69 0.68  CALCIUM 9.0 8.5* 8.2*  MG  --   --  2.2   Liver Function Tests: Recent Labs  Lab 09/22/22 1316  AST 17  ALT 11  ALKPHOS 92  BILITOT 0.6  PROT 7.6  ALBUMIN 4.0   No results for input(s): "LIPASE", "AMYLASE" in the last 168 hours. No results for input(s): "AMMONIA" in the last 168 hours. CBC: Recent Labs  Lab 09/22/22 1316 09/23/22 0454 09/24/22 0559  WBC 8.1 4.5 13.7*  NEUTROABS 5.9  --   --   HGB 15.2 13.8 13.5  HCT 50.8 45.5 42.6  MCV 91.4 89.9 87.7  PLT 316 232 242   Cardiac Enzymes: No results for input(s): "CKTOTAL", "CKMB", "CKMBINDEX", "TROPONINI" in the last 168 hours. BNP: Invalid input(s): "POCBNP" CBG: No results for input(s): "GLUCAP" in the last 168 hours. D-Dimer No results for input(s): "DDIMER" in the last 72 hours. Hgb A1c No results for input(s): "HGBA1C" in the last 72 hours. Lipid Profile No results for input(s): "  CHOL", "HDL", "LDLCALC", "TRIG", "CHOLHDL", "LDLDIRECT" in the last 72 hours. Thyroid function studies No results for input(s): "TSH", "T4TOTAL", "T3FREE", "THYROIDAB" in the last 72 hours.  Invalid input(s): "FREET3" Anemia work up No results for input(s): "VITAMINB12", "FOLATE", "FERRITIN", "TIBC", "IRON", "RETICCTPCT" in the last 72 hours. Urinalysis    Component Value Date/Time   COLORURINE YELLOW (A) 01/16/2019 1113   APPEARANCEUR Clear 07/31/2021 1545   LABSPEC 1.014 01/16/2019 1113   LABSPEC 1.011 09/25/2013 1153   PHURINE 7.0 01/16/2019 1113   GLUCOSEU Negative 07/31/2021 1545    GLUCOSEU Negative 09/25/2013 1153   HGBUR NEGATIVE 01/16/2019 1113   BILIRUBINUR Negative 07/31/2021 1545   BILIRUBINUR Negative 09/25/2013 1153   KETONESUR NEGATIVE 01/16/2019 1113   PROTEINUR Negative 07/31/2021 1545   PROTEINUR NEGATIVE 01/16/2019 1113   NITRITE Negative 07/31/2021 1545   NITRITE NEGATIVE 01/16/2019 1113   LEUKOCYTESUR Negative 07/31/2021 1545   LEUKOCYTESUR NEGATIVE 01/16/2019 1113   LEUKOCYTESUR Negative 09/25/2013 1153   Sepsis Labs Recent Labs  Lab 09/22/22 1316 09/23/22 0454 09/24/22 0559  WBC 8.1 4.5 13.7*   Microbiology Recent Results (from the past 240 hour(s))  Blood culture (routine x 2)     Status: None (Preliminary result)   Collection Time: 09/22/22  1:43 PM   Specimen: BLOOD  Result Value Ref Range Status   Specimen Description BLOOD BLOOD RIGHT ARM  Final   Special Requests   Final    BOTTLES DRAWN AEROBIC AND ANAEROBIC Blood Culture adequate volume   Culture   Final    NO GROWTH 2 DAYS Performed at [facility], [address] [place], [place] [address]    Report Status PENDING  Incomplete  Blood culture (routine x 2)     Status: None (Preliminary result)   Collection Time: 09/22/22  1:43 PM   Specimen: BLOOD  Result Value Ref Range Status   Specimen Description BLOOD BLOOD LEFT ARM  Final   Special Requests   Final    BOTTLES DRAWN AEROBIC AND ANAEROBIC Blood Culture adequate volume   Culture   Final    NO GROWTH 2 DAYS Performed at [facility], [address] [place], [place] [address]    Report Status PENDING  Incomplete  SARS Coronavirus 2 by RT PCR (hospital order, performed in [facility] hospital lab) *cepheid single result test* Anterior Nasal Swab     Status: None   Collection Time: 09/22/22  3:59 PM   Specimen: Anterior Nasal Swab  Result Value Ref Range Status   SARS Coronavirus 2 by RT PCR NEGATIVE NEGATIVE Final    Comment: (NOTE) SARS-CoV-2 target nucleic acids are NOT DETECTED.  The  SARS-CoV-2 RNA is generally detectable in upper and lower respiratory specimens during the acute phase of infection. The lowest concentration of SARS-CoV-2 viral copies this assay can detect is 250 copies / mL. A negative result does not preclude SARS-CoV-2 infection and should not be used as the sole basis for treatment or other patient management decisions.  A negative result may occur with improper specimen collection / handling, submission of specimen other than nasopharyngeal swab, presence of viral mutation(s) within the areas targeted by this assay, and inadequate number of viral copies (<250 copies / mL). A negative result must be combined with clinical observations, patient history, and epidemiological information.  Fact Sheet for Patients:   RoadLapTop.co.za  Fact Sheet for Healthcare Providers: http://kim-miller.com/  This test is not yet approved or  cleared by the [place] FDA and has  been authorized for detection and/or diagnosis of SARS-CoV-2 by FDA under an Emergency Use Authorization (EUA).  This EUA will remain in effect (meaning this test can be used) for the duration of the COVID-19 declaration under Section 564(b)(1) of the Act, 21 U.S.C. section 360bbb-3(b)(1), unless the authorization is terminated or revoked sooner.  Performed at [facility], [address] [place], [place] [address]   Respiratory (~20 pathogens) panel by PCR     Status: None   Collection Time: 09/23/22  6:20 PM   Specimen: Anterior Nasal Swab; Respiratory  Result Value Ref Range Status   Adenovirus NOT DETECTED NOT DETECTED Final   Coronavirus 229E NOT DETECTED NOT DETECTED Final    Comment: (NOTE) The Coronavirus on the Respiratory Panel, DOES NOT test for the novel  Coronavirus (2019 nCoV)    Coronavirus HKU1 NOT DETECTED NOT DETECTED Final   Coronavirus NL63 NOT DETECTED NOT DETECTED Final   Coronavirus OC43 NOT DETECTED  NOT DETECTED Final   Metapneumovirus NOT DETECTED NOT DETECTED Final   Rhinovirus / Enterovirus NOT DETECTED NOT DETECTED Final   Influenza A NOT DETECTED NOT DETECTED Final   Influenza B NOT DETECTED NOT DETECTED Final   Parainfluenza Virus 1 NOT DETECTED NOT DETECTED Final   Parainfluenza Virus 2 NOT DETECTED NOT DETECTED Final   Parainfluenza Virus 3 NOT DETECTED NOT DETECTED Final   Parainfluenza Virus 4 NOT DETECTED NOT DETECTED Final   Respiratory Syncytial Virus NOT DETECTED NOT DETECTED Final   Bordetella pertussis NOT DETECTED NOT DETECTED Final   Bordetella Parapertussis NOT DETECTED NOT DETECTED Final   Chlamydophila pneumoniae NOT DETECTED NOT DETECTED Final   Mycoplasma pneumoniae NOT DETECTED NOT DETECTED Final    Comment: Performed at [facility], [address], [place], [place] [address]     Time coordinating discharge:  I have spent 35 minutes face to face with the patient and on the ward discussing the patients care, assessment, plan and disposition with other care givers. >50% of the time was devoted counseling the patient about the risks and benefits of treatment/Discharge disposition and coordinating care.   SIGNED:   Dimple Nanas, MD  [facility] 09/24/2022, 12:43 PM   If 7PM-7AM, please contact night-coverage

## 2022-09-24 NOTE — TOC Transition Note (Signed)
Transition of Care [facility]) - CM/SW Discharge Note   Patient Details  Name: [name] MRN: [id number] Date of Birth: 25-Mar-1981  Transition of [facility] CM/SW Contact:  Allena Katz, LCSW Phone Number: 09/24/2022, 10:37 AM   Clinical Narrative:   Pt has orders in to discharge home. Nebulizer to be delivered to patients room before discharge. CSW signing off.     Final next level of care: Home/Self Care     Patient Goals and CMS Choice      Discharge Placement                         Discharge Plan and Services Additional resources added to the After Visit Summary for                  DME Arranged: Nebulizer machine   Date DME Agency Contacted: 09/24/22                Social Determinants of Health (SDOH) Interventions SDOH Screenings   Food Insecurity: Food Insecurity Present (04/10/2022)  Housing: Low Risk  (04/10/2022)  Transportation Needs: Unmet Transportation Needs (04/10/2022)  Utilities: Not At Risk (04/10/2022)  Alcohol Screen: Low Risk  (04/10/2022)  Depression (PHQ2-9): High Risk (12/31/2021)  Financial Resource Strain: High Risk (04/10/2022)  Physical Activity: Inactive (04/10/2022)  Social Connections: Socially Isolated (04/10/2022)  Stress: Stress Concern Present (04/10/2022)  Tobacco Use: High Risk (09/23/2022)     Readmission Risk Interventions    09/24/2022   10:33 AM  Readmission Risk Prevention Plan  Transportation Screening Complete  PCP or Specialist Appt within 3-5 Days Complete  HRI or Home Care Consult Complete  Medication Review (RN Care Manager) Complete

## 2022-09-25 ENCOUNTER — Other Ambulatory Visit: Payer: Self-pay

## 2022-09-26 LAB — CULTURE, BLOOD (ROUTINE X 2): Culture: NO GROWTH

## 2022-09-27 LAB — CULTURE, BLOOD (ROUTINE X 2)
Culture: NO GROWTH
Special Requests: ADEQUATE

## 2022-09-30 ENCOUNTER — Other Ambulatory Visit: Payer: Self-pay

## 2022-10-11 ENCOUNTER — Other Ambulatory Visit: Payer: Self-pay

## 2022-11-19 ENCOUNTER — Ambulatory Visit: Payer: Medicaid Other | Admitting: Physician Assistant

## 2022-11-19 NOTE — Progress Notes (Deleted)
New patient visit   Patient: Nathan Vasquez   DOB: [birth year]   42 y.o. Male  MRN: [id number] Visit Date: 11/19/2022  Today's healthcare provider: Debera Lat, PA-C   No chief complaint on file.  Subjective    Nathan Vasquez is a 42 y.o. male who presents today as a new patient to establish care.  HPI  ***  Past Medical History:  Diagnosis Date   Asthma    COPD (chronic obstructive pulmonary disease) (HCC)    Hep C w/o coma, chronic (HCC)    Heroin abuse (HCC)    History of balanitis    Hypertension    Past Surgical History:  Procedure Laterality Date   TOOTH EXTRACTION     Family Status  Relation Name Status   Mother  Alive   Father  Deceased   MGM  Deceased   MGF  Deceased   PGM  Deceased   PGF  Deceased   Family History  Problem Relation Age of Onset   COPD Mother    Diabetes Mother    Heart disease Mother    Anxiety disorder Mother    Stroke Father    Schizophrenia Father    Diabetes Maternal Grandmother    Diabetes Maternal Grandfather    Cancer Maternal Grandfather    Diabetes Paternal Grandmother    Diabetes Paternal Grandfather    Heart attack Paternal Grandfather    Social History   Socioeconomic History   Marital status: Media planner    Spouse name: Not on file   Number of children: 1   Years of education: 6th grade   Highest education level: 6th grade  Occupational History   Occupation: NA  Tobacco Use   Smoking status: Every Day    Packs/day: 0.50    Years: 26.00    Additional pack years: 0.00    Total pack years: 13.00    Types: Cigarettes   Smokeless tobacco: Current  Vaping Use   Vaping Use: Former   Quit date: 12/21/2019  Substance and Sexual Activity   Alcohol use: Yes    Alcohol/week: 1.0 standard drink of alcohol    Types: 1 Cans of beer per week    Comment: seldom, once every 3-6 months   Drug use: Not Currently    Frequency: 7.0 times per week    Types: Heroin, Marijuana, Cocaine     Comment: last use 11/26/21 heroin.   Sexual activity: Yes    Partners: Female  Other Topics Concern   Not on file  Social History Narrative   Not on file   Social Determinants of Health   Financial Resource Strain: High Risk (04/10/2022)   Overall Financial Resource Strain (CARDIA)    Difficulty of Paying Living Expenses: Very hard  Food Insecurity: Food Insecurity Present (04/10/2022)   Hunger Vital Sign    Worried About Running Out of Food in the Last Year: Sometimes true    Ran Out of Food in the Last Year: Sometimes true  Transportation Needs: Unmet Transportation Needs (04/10/2022)   PRAPARE - Administrator, Civil Service (Medical): Yes    Lack of Transportation (Non-Medical): Yes  Physical Activity: Inactive (04/10/2022)   Exercise Vital Sign    Days of Exercise per Week: 0 days    Minutes of Exercise per Session: 0 min  Stress: Stress Concern Present (04/10/2022)   [employer] of Occupational Health - Occupational Stress Questionnaire    Feeling of Stress :  Very much  Social Connections: Socially Isolated (04/10/2022)   Social Connection and Isolation Panel [NHANES]    Frequency of Communication with Friends and Family: More than three times a week    Frequency of Social Gatherings with Friends and Family: Not on file    Attends Religious Services: Never    Database administrator or Organizations: No    Attends Banker Meetings: Never    Marital Status: Never married   Outpatient Medications Prior to Visit  Medication Sig   albuterol (VENTOLIN HFA) 108 (90 Base) MCG/ACT inhaler Inhale 1-2 puffs into the lungs every 6 (six) hours as needed for wheezing or shortness of breath.   albuterol (VENTOLIN HFA) 108 (90 Base) MCG/ACT inhaler Inhale 2 puffs into the lungs every 6 (six) hours as needed for wheezing or shortness of breath.   amLODipine (NORVASC) 10 MG tablet Take 1 tablet (10 mg total) by mouth once daily.   fluticasone-salmeterol  (ADVAIR DISKUS) 500-50 MCG/ACT AEPB Inhale 2 puffs into the lungs in the morning and at bedtime. May decrease to 1 puff in the morning and at bedtime after a couple of weeks depending on symptoms.   gabapentin (NEURONTIN) 100 MG capsule Take 1 capsule (100 mg total) by mouth 3 (three) times daily. (Patient not taking: Reported on 07/03/2022)   HYDROcodone-acetaminophen (NORCO/VICODIN) 5-325 MG tablet Take 1 tablet by mouth every 6 (six) hours as needed for moderate pain.   ipratropium-albuterol (DUONEB) 0.5-2.5 (3) MG/3ML SOLN Take 3 mLs by nebulization every 6 (six) hours as needed.   meloxicam (MOBIC) 7.5 MG tablet Take 1 tablet (7.5 mg total) by mouth daily.   methadone (DOLOPHINE) 10 MG/ML solution Take 80 mg by mouth daily.   methocarbamol (ROBAXIN) 750 MG tablet Take 750 mg by mouth 3 (three) times daily as needed for muscle spasms.   montelukast (SINGULAIR) 10 MG tablet Take 1 tablet (10 mg total) by mouth once nightly at bedtime.   omeprazole (PRILOSEC) 20 MG capsule Take 1 capsule (20 mg total) by mouth once daily.   No facility-administered medications prior to visit.   Allergies  Allergen Reactions   Penicillins Rash    Immunization History  Administered Date(s) Administered   Hepb-cpg 08/23/2021   PFIZER(Purple Top)SARS-COV-2 Vaccination 09/25/2019    Health Maintenance  Topic Date Due   DTaP/Tdap/Td (1 - Tdap) Never done   COVID-19 Vaccine (2 - Pfizer risk series) 10/16/2019   INFLUENZA VACCINE  12/26/2022   Hepatitis C Screening  Completed   HIV Screening  Completed   HPV VACCINES  Aged Out    Patient Care Team: Pcp, No as PCP - General  Review of Systems  Constitutional: Negative.   HENT: Negative.    Eyes: Negative.   Respiratory: Negative.    Cardiovascular: Negative.   Gastrointestinal: Negative.   Endocrine: Negative.   Genitourinary: Negative.   Musculoskeletal: Negative.   Skin: Negative.   Allergic/Immunologic: Negative.   Neurological: Negative.    Hematological: Negative.   Psychiatric/Behavioral: Negative.      {Labs  Heme  Chem  Endocrine  Serology  Results Review (optional):23779}   Objective    There were no vitals taken for this visit. {Show previous vital signs (optional):23777}  Physical Exam ***  Depression Screen    12/31/2021    2:31 PM 08/16/2021    3:38 PM 08/08/2021    2:16 PM 07/31/2021    3:00 PM  PHQ 2/9 Scores  PHQ - 2 Score 3 5 5  6  PHQ- 9 Score 11 19 19 17    No results found for any visits on 11/19/22.  Assessment & Plan     ***  No follow-ups on file.     {provider attestation***:1}   Debera Lat, PA-C  [facility] [phone] (phone) [phone] (fax)  [facility]

## 2023-01-09 ENCOUNTER — Other Ambulatory Visit: Payer: Self-pay

## 2023-01-10 ENCOUNTER — Encounter: Payer: Self-pay | Admitting: Family Medicine

## 2023-01-10 ENCOUNTER — Other Ambulatory Visit: Payer: Self-pay

## 2023-01-10 ENCOUNTER — Ambulatory Visit: Payer: Medicaid Other | Admitting: Family Medicine

## 2023-01-10 VITALS — BP 132/115 | HR 95 | Ht 72.0 in

## 2023-01-10 DIAGNOSIS — I5022 Chronic systolic (congestive) heart failure: Secondary | ICD-10-CM

## 2023-01-10 DIAGNOSIS — J4489 Other specified chronic obstructive pulmonary disease: Secondary | ICD-10-CM

## 2023-01-10 DIAGNOSIS — S42202D Unspecified fracture of upper end of left humerus, subsequent encounter for fracture with routine healing: Secondary | ICD-10-CM

## 2023-01-10 DIAGNOSIS — S42202S Unspecified fracture of upper end of left humerus, sequela: Secondary | ICD-10-CM

## 2023-01-10 DIAGNOSIS — F39 Unspecified mood [affective] disorder: Secondary | ICD-10-CM | POA: Insufficient documentation

## 2023-01-10 DIAGNOSIS — F1911 Other psychoactive substance abuse, in remission: Secondary | ICD-10-CM | POA: Diagnosis not present

## 2023-01-10 DIAGNOSIS — F17210 Nicotine dependence, cigarettes, uncomplicated: Secondary | ICD-10-CM

## 2023-01-10 DIAGNOSIS — I10 Essential (primary) hypertension: Secondary | ICD-10-CM

## 2023-01-10 MED ORDER — FLUTICASONE-SALMETEROL 500-50 MCG/ACT IN AEPB
2.0000 | INHALATION_SPRAY | Freq: Two times a day (BID) | RESPIRATORY_TRACT | 0 refills | Status: DC
Start: 2023-01-10 — End: 2023-05-30
  Filled 2023-01-10 – 2023-05-01 (×3): qty 120, 30d supply, fill #0

## 2023-01-10 MED ORDER — FUROSEMIDE 20 MG PO TABS
20.0000 mg | ORAL_TABLET | Freq: Every day | ORAL | 0 refills | Status: DC | PRN
Start: 2023-01-10 — End: 2023-07-30
  Filled 2023-01-10: qty 30, 30d supply, fill #0

## 2023-01-10 MED ORDER — MONTELUKAST SODIUM 10 MG PO TABS
10.0000 mg | ORAL_TABLET | Freq: Every day | ORAL | 0 refills | Status: DC
Start: 2023-01-10 — End: 2023-02-07
  Filled 2023-01-10: qty 30, 30d supply, fill #0

## 2023-01-10 MED ORDER — AMLODIPINE BESYLATE 5 MG PO TABS
5.0000 mg | ORAL_TABLET | Freq: Every day | ORAL | 0 refills | Status: DC
Start: 2023-01-10 — End: 2023-02-07
  Filled 2023-01-10: qty 30, 30d supply, fill #0

## 2023-01-10 MED ORDER — VALSARTAN 40 MG PO TABS
40.0000 mg | ORAL_TABLET | Freq: Every day | ORAL | 0 refills | Status: DC
Start: 2023-01-10 — End: 2023-01-22
  Filled 2023-01-10: qty 30, 30d supply, fill #0

## 2023-01-10 MED ORDER — SPIRONOLACTONE 25 MG PO TABS
25.0000 mg | ORAL_TABLET | Freq: Every day | ORAL | 0 refills | Status: DC
Start: 2023-01-10 — End: 2023-02-07
  Filled 2023-01-10: qty 30, 30d supply, fill #0

## 2023-01-10 MED ORDER — DULOXETINE HCL 30 MG PO CPEP
30.0000 mg | ORAL_CAPSULE | Freq: Two times a day (BID) | ORAL | 0 refills | Status: DC
  Filled 2023-01-10: qty 60, 30d supply, fill #0

## 2023-01-10 MED ORDER — CARVEDILOL 3.125 MG PO TABS
3.1250 mg | ORAL_TABLET | Freq: Two times a day (BID) | ORAL | 0 refills | Status: DC
Start: 2023-01-10 — End: 2023-02-07
  Filled 2023-01-10: qty 60, 30d supply, fill #0

## 2023-01-10 MED ORDER — EMPAGLIFLOZIN 10 MG PO TABS
10.0000 mg | ORAL_TABLET | Freq: Every day | ORAL | 0 refills | Status: DC
Start: 2023-01-10 — End: 2023-02-07
  Filled 2023-01-10 – 2023-01-22 (×2): qty 30, 30d supply, fill #0

## 2023-01-10 NOTE — Assessment & Plan Note (Signed)
Chronic, uncontrolled Likely multifocal process Recommend slow start of medications with very close f/u recommended Reviewed vital sign goals with patient Pt reports he has a scale and a BP cuff to assist in checking his VS Urgent referral to cardiology to further assist

## 2023-01-10 NOTE — Assessment & Plan Note (Signed)
Pt noted with previous use of heroin; however, on chart review also notes use of fentanyl Pt reports he was 230 lbs when he got out of prison 18 months ago Reports weight now around 155 lbs Recommend UDS to further assist  Previously prescribed methadone

## 2023-01-10 NOTE — Assessment & Plan Note (Signed)
Dx 4/24; pt denies known cause if ischemic vs non ischemic; known heroin use Recommend referral and start of GDMT per insurance and outpatient setting limitations to assist with ongoing symptoms

## 2023-01-10 NOTE — Assessment & Plan Note (Signed)
Chronic; previously on cymbalta- wishes to restart to assist with chronic pain Reports multiple people in his family have mental illness Denies current concerns for SI or HI Presents with g/f

## 2023-01-10 NOTE — Progress Notes (Signed)
New patient visit  Patient: Nathan Vasquez   DOB: 10-19-1980   42 y.o. Male  MRN: [id number] Visit Date: 01/10/2023  Today's healthcare provider: Jacky Kindle, FNP  Patient presents for new patient visit to establish care.  Introduced to Publishing rights manager role and practice setting.  All questions answered.  Discussed provider/patient relationship and expectations.  Chief Complaint  Patient presents with   Establish Care    Patient has been dx with heart failure about 1 month ago, currently not taking any medications, fatigue all the time, difficulty breathing    Medication Refill    All medications    Subjective    Nathan Vasquez Vasquez is a 42 y.o. male who presents today as a new patient to establish care.   Medication Refill   HPI     Establish Care    Additional comments: Patient has been dx with heart failure about 1 month ago, currently not taking any medications, fatigue all the time, difficulty breathing         Medication Refill    Additional comments: All medications       Last edited by Rolly Salter, CMA on 01/10/2023  2:06 PM.      Past Medical History:  Diagnosis Date   Asthma    COPD (chronic obstructive pulmonary disease) (HCC)    Heart failure (HCC)    Hep C w/o coma, chronic (HCC)    Heroin abuse (HCC)    History of balanitis    Hypertension    Past Surgical History:  Procedure Laterality Date   TOOTH EXTRACTION     Family Status  Relation Name Status   Mother  Alive   Father  Deceased   MGM  Deceased   MGF  Deceased   PGM  Deceased   PGF  Deceased  No partnership data on file   Family History  Problem Relation Age of Onset   COPD Mother    Diabetes Mother    Heart disease Mother    Anxiety disorder Mother    Stroke Father    Schizophrenia Father    Diabetes Maternal Grandmother    Diabetes Maternal Grandfather    Cancer Maternal Grandfather    Diabetes Paternal Grandmother    Diabetes Paternal Grandfather     Heart attack Paternal Grandfather    Social History   Socioeconomic History   Marital status: Single    Spouse name: Not on file   Number of children: 1   Years of education: 6th grade   Highest education level: 6th grade  Occupational History   Occupation: NA  Tobacco Use   Smoking status: Every Day    Current packs/day: 0.50    Average packs/day: 0.5 packs/day for 26.0 years (13.0 ttl pk-yrs)    Types: Cigarettes   Smokeless tobacco: Current  Vaping Use   Vaping status: Former   Quit date: 12/21/2019  Substance and Sexual Activity   Alcohol use: Yes    Alcohol/week: 1.0 standard drink of alcohol    Types: 1 Cans of beer per week    Comment: seldom, once every 3-6 months   Drug use: Not Currently    Frequency: 7.0 times per week    Types: Heroin, Marijuana, Cocaine    Comment: last use 11/26/21 heroin.   Sexual activity: Yes    Partners: Female  Other Topics Concern   Not on file  Social History Narrative   Not on file   Social  Determinants of Health   Financial Resource Strain: High Risk (04/10/2022)   Overall Financial Resource Strain (CARDIA)    Difficulty of Paying Living Expenses: Very hard  Food Insecurity: Food Insecurity Present (04/10/2022)   Hunger Vital Sign    Worried About Running Out of Food in the Last Year: Sometimes true    Ran Out of Food in the Last Year: Sometimes true  Transportation Needs: Unmet Transportation Needs (04/10/2022)   PRAPARE - Administrator, Civil Service (Medical): Yes    Lack of Transportation (Non-Medical): Yes  Physical Activity: Inactive (04/10/2022)   Exercise Vital Sign    Days of Exercise per Week: 0 days    Minutes of Exercise per Session: 0 min  Stress: Stress Concern Present (04/10/2022)   [employer] of Occupational Health - Occupational Stress Questionnaire    Feeling of Stress : Very much  Social Connections: Socially Isolated (04/10/2022)   Social Connection and Isolation Panel [NHANES]     Frequency of Communication with Friends and Family: More than three times a week    Frequency of Social Gatherings with Friends and Family: Not on file    Attends Religious Services: Never    Database administrator or Organizations: No    Attends Banker Meetings: Never    Marital Status: Never married   Outpatient Medications Prior to Visit  Medication Sig   [DISCONTINUED] albuterol (VENTOLIN HFA) 108 (90 Base) MCG/ACT inhaler Inhale 1-2 puffs into the lungs every 6 (six) hours as needed for wheezing or shortness of breath.   [DISCONTINUED] albuterol (VENTOLIN HFA) 108 (90 Base) MCG/ACT inhaler Inhale 2 puffs into the lungs every 6 (six) hours as needed for wheezing or shortness of breath.   [DISCONTINUED] amLODipine (NORVASC) 10 MG tablet Take 1 tablet (10 mg total) by mouth once daily.   [DISCONTINUED] fluticasone-salmeterol (ADVAIR DISKUS) 500-50 MCG/ACT AEPB Inhale 2 puffs into the lungs in the morning and at bedtime. May decrease to 1 puff in the morning and at bedtime after a couple of weeks depending on symptoms.   [DISCONTINUED] gabapentin (NEURONTIN) 100 MG capsule Take 1 capsule (100 mg total) by mouth 3 (three) times daily.   [DISCONTINUED] ipratropium-albuterol (DUONEB) 0.5-2.5 (3) MG/3ML SOLN Take 3 mLs by nebulization every 6 (six) hours as needed.   [DISCONTINUED] meloxicam (MOBIC) 7.5 MG tablet Take 1 tablet (7.5 mg total) by mouth daily.   [DISCONTINUED] methocarbamol (ROBAXIN) 750 MG tablet Take 750 mg by mouth 3 (three) times daily as needed for muscle spasms.   [DISCONTINUED] montelukast (SINGULAIR) 10 MG tablet Take 1 tablet (10 mg total) by mouth once nightly at bedtime.   [DISCONTINUED] omeprazole (PRILOSEC) 20 MG capsule Take 1 capsule (20 mg total) by mouth once daily.   [DISCONTINUED] HYDROcodone-acetaminophen (NORCO/VICODIN) 5-325 MG tablet Take 1 tablet by mouth every 6 (six) hours as needed for moderate pain. (Patient not taking: Reported on 01/10/2023)    [DISCONTINUED] methadone (DOLOPHINE) 10 MG/ML solution Take 80 mg by mouth daily. (Patient not taking: Reported on 01/10/2023)   No facility-administered medications prior to visit.   Allergies  Allergen Reactions   Penicillins Rash    Immunization History  Administered Date(s) Administered   Hepb-cpg 08/23/2021   PFIZER(Purple Top)SARS-COV-2 Vaccination 09/25/2019    Health Maintenance  Topic Date Due   DTaP/Tdap/Td (1 - Tdap) Never done   COVID-19 Vaccine (2 - Pfizer risk series) 10/16/2019   INFLUENZA VACCINE  08/25/2023 (Originally 12/26/2022)   Hepatitis C Screening  Completed   HIV Screening  Completed   HPV VACCINES  Aged Out   Patient Care Team: Jacky Kindle, FNP as PCP - General (Family Medicine)   Objective    BP (!) 132/115 (BP Location: Right Arm, Patient Position: Sitting, Cuff Size: Normal)   Pulse 95   Ht 6' (1.829 m)   SpO2 97%   BMI 27.03 kg/m   Physical Exam Vitals and nursing note reviewed.  Constitutional:      Appearance: Normal appearance. He is normal weight.  HENT:     Head: Normocephalic and atraumatic.  Eyes:     Pupils: Pupils are equal, round, and reactive to light.  Cardiovascular:     Rate and Rhythm: Normal rate and regular rhythm.     Pulses: Normal pulses.     Heart sounds: Normal heart sounds.  Pulmonary:     Effort: Pulmonary effort is normal.     Breath sounds: Normal breath sounds.  Musculoskeletal:        General: Normal range of motion.     Cervical back: Normal range of motion.  Skin:    General: Skin is warm and dry.     Capillary Refill: Capillary refill takes less than 2 seconds.  Neurological:     General: No focal deficit present.     Mental Status: He is alert and oriented to person, place, and time. Mental status is at baseline.    Depression Screen    12/31/2021    2:31 PM 08/16/2021    3:38 PM 08/08/2021    2:16 PM 07/31/2021    3:00 PM  PHQ 2/9 Scores  PHQ - 2 Score 3 5 5 6   PHQ- 9 Score 11 19 19 17     No results found for any visits on 01/10/23.  Assessment & Plan      Problem List Items Addressed This Visit       Cardiovascular and Mediastinum   Essential hypertension    Chronic, uncontrolled Likely multifocal process Recommend slow start of medications with very close f/u recommended Reviewed vital sign goals with patient Pt reports he has a scale and a BP cuff to assist in checking his VS Urgent referral to cardiology to further assist      Relevant Medications   amLODipine (NORVASC) 5 MG tablet   valsartan (DIOVAN) 40 MG tablet   spironolactone (ALDACTONE) 25 MG tablet   furosemide (LASIX) 20 MG tablet   empagliflozin (JARDIANCE) 10 MG TABS tablet   carvedilol (COREG) 3.125 MG tablet   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    Dx 4/24; pt denies known cause if ischemic vs non ischemic; known heroin use Recommend referral and start of GDMT per insurance and outpatient setting limitations to assist with ongoing symptoms       Relevant Medications   amLODipine (NORVASC) 5 MG tablet   valsartan (DIOVAN) 40 MG tablet   spironolactone (ALDACTONE) 25 MG tablet   furosemide (LASIX) 20 MG tablet   empagliflozin (JARDIANCE) 10 MG TABS tablet   carvedilol (COREG) 3.125 MG tablet   DULoxetine (CYMBALTA) 30 MG capsule   Other Relevant Orders   Ambulatory referral to Cardiology   CBC with Differential/Platelet   Comprehensive Metabolic Panel (CMET)   TSH + free T4   Magnesium   Lipid panel   Hemoglobin A1c   Urine Microalbumin w/creat. ratio   Urinalysis, Routine w reflex microscopic     Respiratory   Asthma with  chronic obstructive pulmonary disease (COPD)    Chronic, stable Active wheezing noted Recommend further cessation with goal of reduction of tobacco and change in personal environment to remove himself from those who smoke around him  Refills provided of singular and inhaler       Relevant Medications    fluticasone-salmeterol (ADVAIR DISKUS) 500-50 MCG/ACT AEPB   montelukast (SINGULAIR) 10 MG tablet     Musculoskeletal and Integument   Closed fracture of proximal end of left humerus    Patient with ongoing pain complaint of L arm s/p fall with fracture Requests high dose NSAIDs and muscle relaxants to assist Recommend initial labs including urine micro given chronic disease state to determine best medication to assist         Other   History of substance abuse (HCC)    Pt noted with previous use of heroin; however, on chart review also notes use of fentanyl Pt reports he was 230 lbs when he got out of prison 18 months ago Reports weight now around 155 lbs Recommend UDS to further assist  Previously prescribed methadone       Relevant Orders   Drug Screen 12+Alcohol+CRT, Ur   Mood disorder (HCC)    Chronic; previously on cymbalta- wishes to restart to assist with chronic pain Reports multiple people in his family have mental illness Denies current concerns for SI or HI Presents with g/f       Relevant Medications   DULoxetine (CYMBALTA) 30 MG capsule   Other Relevant Orders   Drug Screen 12+Alcohol+CRT, Ur   Tobacco dependence due to cigarettes    Chronic, improved per pt report Reports 40 cigs down to 10 cigs/day; however, strong odor noted during exam Continue to recommend further cessation efforts with goal of abstinence       Relevant Orders   Drug Screen 12+Alcohol+CRT, Ur   Return in about 1 week (around 01/17/2023).    Leilani Merl, FNP, have reviewed all documentation for this visit. The documentation on 01/10/23 for the exam, diagnosis, procedures, and orders are all accurate and complete.  Jacky Kindle, FNP  [facility] [phone] (phone) [phone] (fax)  [facility]

## 2023-01-10 NOTE — Assessment & Plan Note (Signed)
Patient with ongoing pain complaint of L arm s/p fall with fracture Requests high dose NSAIDs and muscle relaxants to assist Recommend initial labs including urine micro given chronic disease state to determine best medication to assist

## 2023-01-10 NOTE — Assessment & Plan Note (Signed)
Chronic, improved per pt report Reports 40 cigs down to 10 cigs/day; however, strong odor noted during exam Continue to recommend further cessation efforts with goal of abstinence

## 2023-01-10 NOTE — Assessment & Plan Note (Signed)
Chronic, stable Active wheezing noted Recommend further cessation with goal of reduction of tobacco and change in personal environment to remove himself from those who smoke around him  Refills provided of singular and inhaler

## 2023-01-15 ENCOUNTER — Other Ambulatory Visit: Payer: Self-pay

## 2023-01-16 LAB — COMPREHENSIVE METABOLIC PANEL
ALT: 15 IU/L (ref 0–44)
AST: 16 IU/L (ref 0–40)
Albumin: 4.7 g/dL (ref 4.1–5.1)
Alkaline Phosphatase: 112 IU/L (ref 44–121)
BUN/Creatinine Ratio: 8 — ABNORMAL LOW (ref 9–20)
BUN: 8 mg/dL (ref 6–24)
Bilirubin Total: 0.4 mg/dL (ref 0.0–1.2)
CO2: 29 mmol/L (ref 20–29)
Calcium: 9.9 mg/dL (ref 8.7–10.2)
Chloride: 96 mmol/L (ref 96–106)
Creatinine, Ser: 1 mg/dL (ref 0.76–1.27)
Globulin, Total: 2.5 g/dL (ref 1.5–4.5)
Glucose: 72 mg/dL (ref 70–99)
Potassium: 5 mmol/L (ref 3.5–5.2)
Sodium: 138 mmol/L (ref 134–144)
Total Protein: 7.2 g/dL (ref 6.0–8.5)
eGFR: 97 mL/min/{1.73_m2} (ref >59)

## 2023-01-16 LAB — MICROSCOPIC EXAMINATION
Bacteria, UA: NONE SEEN
Casts: NONE SEEN /LPF
WBC, UA: NONE SEEN /HPF (ref 0–5)

## 2023-01-16 LAB — CBC WITH DIFFERENTIAL/PLATELET
Basophils Absolute: 0.1 10*3/uL (ref 0.0–0.2)
Basos: 1 %
EOS (ABSOLUTE): 0.1 10*3/uL (ref 0.0–0.4)
Eos: 3 %
Hematocrit: 49.9 % (ref 37.5–51.0)
Hemoglobin: 16.1 g/dL (ref 13.0–17.7)
Immature Grans (Abs): 0 10*3/uL (ref 0.0–0.1)
Immature Granulocytes: 0 %
Lymphocytes Absolute: 1.7 10*3/uL (ref 0.7–3.1)
Lymphs: 36 %
MCH: 28.5 pg (ref 26.6–33.0)
MCHC: 32.3 g/dL (ref 31.5–35.7)
MCV: 88 fL (ref 79–97)
Monocytes Absolute: 0.4 10*3/uL (ref 0.1–0.9)
Monocytes: 10 %
Neutrophils Absolute: 2.3 10*3/uL (ref 1.4–7.0)
Neutrophils: 50 %
Platelets: 171 10*3/uL (ref 150–450)
RBC: 5.65 x10E6/uL (ref 4.14–5.80)
RDW: 13.8 % (ref 11.6–15.4)
WBC: 4.6 10*3/uL (ref 3.4–10.8)

## 2023-01-16 LAB — DRUG SCREEN 12+ALCOHOL+CRT, UR
Amphetamines, Urine: POSITIVE — AB
BENZODIAZ UR QL: NEGATIVE ng/mL
Barbiturate: NEGATIVE ng/mL
Cannabinoids: POSITIVE — AB
Cocaine (Metabolite): NEGATIVE ng/mL
Creatinine, Urine: 312.6 mg/dL — ABNORMAL HIGH (ref 20.0–300.0)
Ethanol, Urine: NEGATIVE %
Meperidine: NEGATIVE ng/mL
Methadone: POSITIVE — AB
OPIATE SCREEN URINE: NEGATIVE ng/mL
Oxycodone/Oxymorphone, Urine: NEGATIVE ng/mL
Phencyclidine: NEGATIVE ng/mL
Propoxyphene: NEGATIVE ng/mL
Tramadol: NEGATIVE ng/mL

## 2023-01-16 LAB — URINALYSIS, ROUTINE W REFLEX MICROSCOPIC
Bilirubin, UA: NEGATIVE
Glucose, UA: NEGATIVE
Nitrite, UA: NEGATIVE
RBC, UA: NEGATIVE
Specific Gravity, UA: 1.023 (ref 1.005–1.030)
Urobilinogen, Ur: 1 mg/dL (ref 0.2–1.0)
pH, UA: 6 (ref 5.0–7.5)

## 2023-01-16 LAB — LIPID PANEL
Chol/HDL Ratio: 2.7 ratio (ref 0.0–5.0)
Cholesterol, Total: 162 mg/dL (ref 100–199)
HDL: 61 mg/dL (ref >39)
LDL Chol Calc (NIH): 86 mg/dL (ref 0–99)
Triglycerides: 77 mg/dL (ref 0–149)
VLDL Cholesterol Cal: 15 mg/dL (ref 5–40)

## 2023-01-16 LAB — TSH+FREE T4
Free T4: 1.54 ng/dL (ref 0.82–1.77)
TSH: 1.33 u[IU]/mL (ref 0.450–4.500)

## 2023-01-16 LAB — HEMOGLOBIN A1C
Est. average glucose Bld gHb Est-mCnc: 97 mg/dL
Hgb A1c MFr Bld: 5 % (ref 4.8–5.6)

## 2023-01-16 LAB — MICROALBUMIN / CREATININE URINE RATIO
Creatinine, Urine: 311.3 mg/dL
Microalb/Creat Ratio: 7 mg/g{creat} (ref 0–29)
Microalbumin, Urine: 22.9 ug/mL

## 2023-01-16 LAB — MAGNESIUM: Magnesium: 2 mg/dL (ref 1.6–2.3)

## 2023-01-17 ENCOUNTER — Ambulatory Visit (INDEPENDENT_AMBULATORY_CARE_PROVIDER_SITE_OTHER): Payer: Medicaid Other | Admitting: Family Medicine

## 2023-01-17 ENCOUNTER — Other Ambulatory Visit: Payer: Self-pay

## 2023-01-17 ENCOUNTER — Encounter: Payer: Self-pay | Admitting: Family Medicine

## 2023-01-17 VITALS — BP 122/77 | HR 86 | Temp 97.5°F | Resp 20 | Ht 72.0 in | Wt 167.4 lb

## 2023-01-17 DIAGNOSIS — Z139 Encounter for screening, unspecified: Secondary | ICD-10-CM | POA: Diagnosis not present

## 2023-01-17 DIAGNOSIS — N200 Calculus of kidney: Secondary | ICD-10-CM | POA: Diagnosis not present

## 2023-01-17 DIAGNOSIS — I5022 Chronic systolic (congestive) heart failure: Secondary | ICD-10-CM | POA: Diagnosis not present

## 2023-01-17 MED ORDER — COLCHICINE 0.6 MG PO TABS
0.6000 mg | ORAL_TABLET | Freq: Two times a day (BID) | ORAL | 0 refills | Status: DC
Start: 2023-01-17 — End: 2023-02-07
  Filled 2023-01-17: qty 10, 5d supply, fill #0

## 2023-01-17 MED ORDER — TAMSULOSIN HCL 0.4 MG PO CAPS
0.8000 mg | ORAL_CAPSULE | Freq: Every day | ORAL | 0 refills | Status: DC
Start: 2023-01-17 — End: 2023-03-19
  Filled 2023-01-17: qty 60, 30d supply, fill #0

## 2023-01-17 NOTE — Assessment & Plan Note (Signed)
Pt reports difficulty obtaining medications due to cost as well as $4 co pay for appt Referral placed

## 2023-01-17 NOTE — Assessment & Plan Note (Signed)
Chronic, has upcoming appt with heart failure team Well appearing on exam overall; however, some complaints of acute back pain Continue all medications at this time while patient established disability coverage

## 2023-01-17 NOTE — Assessment & Plan Note (Signed)
Acute, self limiting Defer imaging given similar course to previous stone Start anti inflammatory, push fluids, add flomax to assist F/u as needed

## 2023-01-17 NOTE — Progress Notes (Signed)
Established patient visit   Patient: Nathan Vasquez   DOB: 1980-11-01   42 y.o. Male  MRN: [id number] Visit Date: 01/17/2023  Today's healthcare provider: Jacky Kindle, FNP  Introduced to nurse practitioner role and practice setting.  All questions answered.  Discussed provider/patient relationship and expectations.  Subjective    HPI HPI     Medical Management of Chronic Issues    Additional comments: Patient reports good compliance and tolerance to medications. He is not checking his BP at home. He is C/O back Pain x 3 days.      Last edited by Myles Lipps, CMA on 01/17/2023  1:47 PM.      Medications: Outpatient Medications Prior to Visit  Medication Sig   amLODipine (NORVASC) 5 MG tablet Take 1 tablet (5 mg total) by mouth daily.   carvedilol (COREG) 3.125 MG tablet Take 1 tablet (3.125 mg total) by mouth 2 (two) times daily with a meal.   DULoxetine (CYMBALTA) 30 MG capsule Take 1 capsule (30 mg total) by mouth 2 (two) times daily.   empagliflozin (JARDIANCE) 10 MG TABS tablet Take 1 tablet (10 mg total) by mouth daily before breakfast.   fluticasone-salmeterol (ADVAIR DISKUS) 500-50 MCG/ACT AEPB Inhale 2 puffs into the lungs in the morning and at bedtime. May decrease to 1 puff in the morning and at bedtime after a couple of weeks depending on symptoms.   furosemide (LASIX) 20 MG tablet Take 1 tablet (20 mg total) by mouth daily as needed for edema or fluid.   montelukast (SINGULAIR) 10 MG tablet Take 1 tablet (10 mg total) by mouth once nightly at bedtime.   spironolactone (ALDACTONE) 25 MG tablet Take 1 tablet (25 mg total) by mouth daily.   valsartan (DIOVAN) 40 MG tablet Take 1 tablet (40 mg total) by mouth daily.   No facility-administered medications prior to visit.    Review of Systems    Objective    BP 122/77 (BP Location: Left Arm, Patient Position: Sitting, Cuff Size: Normal)   Pulse 86   Temp (!) 97.5 F (36.4 C) (Temporal)    Resp 20   Ht 6' (1.829 m)   Wt 167 lb 6.4 oz (75.9 kg)   SpO2 97%   BMI 22.70 kg/m   Physical Exam Vitals and nursing note reviewed.  Constitutional:      Appearance: Normal appearance. He is normal weight.  HENT:     Head: Normocephalic and atraumatic.  Cardiovascular:     Rate and Rhythm: Normal rate and regular rhythm.     Pulses: Normal pulses.     Heart sounds: Normal heart sounds.  Pulmonary:     Effort: Pulmonary effort is normal.     Breath sounds: Normal breath sounds.  Abdominal:     Tenderness: There is no right CVA tenderness, left CVA tenderness or guarding.     Comments: Reports low left back pain similar to when he had kidney stones   Musculoskeletal:        General: Normal range of motion.     Cervical back: Normal range of motion.  Skin:    General: Skin is warm and dry.     Capillary Refill: Capillary refill takes less than 2 seconds.  Neurological:     General: No focal deficit present.     Mental Status: He is alert and oriented to person, place, and time. Mental status is at baseline.  Psychiatric:  Mood and Affect: Mood normal.        Behavior: Behavior normal.        Thought Content: Thought content normal.        Judgment: Judgment normal.     No results found for any visits on 01/17/23.  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    Chronic, has upcoming appt with heart failure team Well appearing on exam overall; however, some complaints of acute back pain Continue all medications at this time while patient established disability coverage      Relevant Medications   tamsulosin (FLOMAX) 0.4 MG CAPS capsule   colchicine 0.6 MG tablet   Other Relevant Orders   AMB Referral to Managed Medicaid Care Management     Genitourinary   Kidney stone    Acute, self limiting Defer imaging given similar  course to previous stone Start anti inflammatory, push fluids, add flomax to assist F/u as needed      Relevant Medications   tamsulosin (FLOMAX) 0.4 MG CAPS capsule   colchicine 0.6 MG tablet   Other Relevant Orders   AMB Referral to Managed Medicaid Care Management     Other   Encounter for screening involving social determinants of health (SDoH)    Pt reports difficulty obtaining medications due to cost as well as $4 co pay for appt Referral placed       Relevant Orders   AMB Referral to Managed Medicaid Care Management   Return in about 4 weeks (around 02/14/2023), or if symptoms worsen or fail to improve, for anxiety and depression.     Leilani Merl, FNP, have reviewed all documentation for this visit. The documentation on 01/17/23 for the exam, diagnosis, procedures, and orders are all accurate and complete.  Jacky Kindle, FNP  [facility] [phone] (phone) [phone] (fax)  [facility]

## 2023-01-20 ENCOUNTER — Telehealth: Payer: Self-pay | Admitting: *Deleted

## 2023-01-20 NOTE — Progress Notes (Signed)
  Managed Medicaid  Care Guide Note  01/20/2023 Name: Nathan Vasquez MRN: [id number] DOB: 1980/07/25  Nathan Vasquez is a 42 y.o. year old male who is a primary care patient of Jacky Kindle, FNP.   Nathan Vasquez is enrolled in a Managed Medicaid plan: Yes. I reached out to Patient                                              by phone today in response to a referral sent by Mr. CLEVLAND KOZUB Vasquez's Jacky Kindle, FNP. Outreach attempt today was successful.     Mr. Desantis was given information about care management services as a benefit of their Medicaid health plan.   Patient                                              agreed to services and verbal consent obtained.   Follow up plan: Telephone appointment with care management team member scheduled for:01/29/23  [facility] Coordination Care Guide  Direct Dial: [phone]

## 2023-01-21 NOTE — Progress Notes (Unsigned)
Advanced Heart Failure Clinic Note   Referring Physician: Merita Norton FNP PCP: Jacky Kindle, FNP (last seen 08/24) Cardiologist: to see End, Cristal Deer, MD 10/24  HPI:  Nathan Vasquez is a 42 y/o male with a history of HTN, bipolar disorder, moderate persistent asthma, hepatitis C with SVR, opiod use disorder, anxiety, tobacco use and chronic heart failure. Broke his left humerus 02/24 after a fall.   Was in the ED 07/25/22 after falling. Xray confirmed left humeral neck fracture. Placed arm in sling with orthpaedic f/u. Admitted 09/22/22 due to lower extremity swelling, generalized fatigue, dyspnea on exertion. Currently follows at outpatient methadone clinic but recently use fentanyl. Upon arrival noted to be hypoxic requiring 6 L nasal cannula, CTA chest was negative for PE but showed bilateral nodular opacity. Patient was started on azithromycin, Rocephin and diuretics. BNP minimally elevated. Procalcitonin is negative. COVID-negative.  Full respiratory panel-Neg   Echo 09/23/22: EF 55-60%  He presents today for his initial visit with a chief complaint of moderate SOB with minimal exertion. Worse when walking up stairs. Has associated fatigue, chest pain when SOB, dizziness, palpitations, intermittent trouble sleeping. Twitching at times when about to fall asleep. Reports losing weight because he has not appetite. Denies snoring (girlfriend confirms), cough, pedal edema or abdominal distention. His biggest complaint today is of thrush that he has in his mouth. Says that his dentist put him on nystatin and he's been using this BID but doesn't feel like it's helping any.   Currently lives with his mom and his bedroom is upstairs. Says that he gets very SOB when walking up the stairs but then once he sits for a few minutes, his breathing improves. Is currently unable to work. Broke his left arm after a fall 02/24 and said that the pain got so bad that he would use "whatever he could get, heroin,  fentanyl etc". Is now going to the methadone clinic 3 days/ week.   Smokes 1/2 ppd of cigarettes and has no desire to quit. Says that he has quit in the past. Denies alcohol use.   Review of Systems: [y] = yes, [ ]  = no   General: Weight gain [ ] ; Weight loss Cove.Etienne ]; Anorexia [ ] ; Fatigue Cove.Etienne ]; Fever [ ] ; Chills [ ] ; Weakness [ ]   Cardiac: Chest pain/pressure [ y]; Resting SOB [ ] ; Exertional SOB [ y]; Orthopnea [ ] ; Pedal Edema [ ] ; Palpitations Cove.Etienne ]; Syncope [ ] ; Presyncope [ ] ; Paroxysmal nocturnal dyspnea[ ]   Pulmonary: Cough [ ] ; Princella Pellegrini ]; Hemoptysis[ ] ; Sputum [ ] ; Snoring [ ]   GI: Vomiting[ ] ; Dysphagia[ ] ; Melena[ ] ; Hematochezia [ ] ; Heartburn[ ] ; Abdominal pain [ ] ; Constipation [ ] ; Diarrhea [ ] ; BRBPR [ ]   GU: Hematuria[ ] ; Dysuria [ ] ; Nocturia[ ]   Vascular: Pain in legs with walking [ ] ; Pain in feet with lying flat [ ] ; Non-healing sores [ ] ; Stroke [ ] ; TIA [ ] ; Slurred speech [ ] ;  Neuro: Headaches[ ] ; Vertigo[ ] ; Seizures[ ] ; Paresthesias[ ] ;Blurred vision [ ] ; Diplopia [ ] ; Vision changes [ ] ; Dizziness [y]  Ortho/Skin: Arthritis [ ] ; Joint pain Cove.Etienne ]; Muscle pain [ ] ; Joint swelling [ ] ; Back Pain [ ] ; Rash [ ]   Psych: Depression[ ] ; Anxiety[y ]  Heme: Bleeding problems [ ] ; Clotting disorders [ ] ; Anemia [ ]   Endocrine: Diabetes [ ] ; Thyroid dysfunction[ ]    Past Medical History:  Diagnosis Date   Asthma    COPD (chronic obstructive  pulmonary disease) (HCC)    Heart failure (HCC)    Hep C w/o coma, chronic (HCC)    Heroin abuse (HCC)    History of balanitis    Hypertension     Current Outpatient Medications  Medication Sig Dispense Refill   amLODipine (NORVASC) 5 MG tablet Take 1 tablet (5 mg total) by mouth daily. 30 tablet 0   carvedilol (COREG) 3.125 MG tablet Take 1 tablet (3.125 mg total) by mouth 2 (two) times daily with a meal. 60 tablet 0   colchicine 0.6 MG tablet Take 1 tablet (0.6 mg total) by mouth 2 (two) times daily. 10 tablet 0   DULoxetine  (CYMBALTA) 30 MG capsule Take 1 capsule (30 mg total) by mouth 2 (two) times daily. 60 capsule 0   empagliflozin (JARDIANCE) 10 MG TABS tablet Take 1 tablet (10 mg total) by mouth daily before breakfast. 30 tablet 0   fluticasone-salmeterol (ADVAIR DISKUS) 500-50 MCG/ACT AEPB Inhale 2 puffs into the lungs in the morning and at bedtime. May decrease to 1 puff in the morning and at bedtime after a couple of weeks depending on symptoms. 120 each 0   furosemide (LASIX) 20 MG tablet Take 1 tablet (20 mg total) by mouth daily as needed for edema or fluid. 30 tablet 0   montelukast (SINGULAIR) 10 MG tablet Take 1 tablet (10 mg total) by mouth once nightly at bedtime. 30 tablet 0   spironolactone (ALDACTONE) 25 MG tablet Take 1 tablet (25 mg total) by mouth daily. 30 tablet 0   tamsulosin (FLOMAX) 0.4 MG CAPS capsule Take 2 capsules (0.8 mg total) by mouth daily. 60 capsule 0   valsartan (DIOVAN) 40 MG tablet Take 1 tablet (40 mg total) by mouth daily. 30 tablet 0   No current facility-administered medications for this visit.    Allergies  Allergen Reactions   Penicillins Rash      Social History   Socioeconomic History   Marital status: Single    Spouse name: Not on file   Number of children: 1   Years of education: 6th grade   Highest education level: 6th grade  Occupational History   Occupation: NA  Tobacco Use   Smoking status: Every Day    Current packs/day: 0.50    Average packs/day: 0.5 packs/day for 26.0 years (13.0 ttl pk-yrs)    Types: Cigarettes   Smokeless tobacco: Current  Vaping Use   Vaping status: Former   Quit date: 12/21/2019  Substance and Sexual Activity   Alcohol use: Yes    Alcohol/week: 1.0 standard drink of alcohol    Types: 1 Cans of beer per week    Comment: seldom, once every 3-6 months   Drug use: Not Currently    Frequency: 7.0 times per week    Types: Heroin, Marijuana, Cocaine    Comment: last use 11/26/21 heroin.   Sexual activity: Yes    Partners:  Female  Other Topics Concern   Not on file  Social History Narrative   Not on file   Social Determinants of Health   Financial Resource Strain: High Risk (04/10/2022)   Overall Financial Resource Strain (CARDIA)    Difficulty of Paying Living Expenses: Very hard  Food Insecurity: Food Insecurity Present (04/10/2022)   Hunger Vital Sign    Worried About Running Out of Food in the Last Year: Sometimes true    Ran Out of Food in the Last Year: Sometimes true  Transportation Needs: Unmet Transportation Needs (04/10/2022)  PRAPARE - Administrator, Civil Service (Medical): Yes    Lack of Transportation (Non-Medical): Yes  Physical Activity: Inactive (04/10/2022)   Exercise Vital Sign    Days of Exercise per Week: 0 days    Minutes of Exercise per Session: 0 min  Stress: Stress Concern Present (04/10/2022)   [employer] of Occupational Health - Occupational Stress Questionnaire    Feeling of Stress : Very much  Social Connections: Socially Isolated (04/10/2022)   Social Connection and Isolation Panel [NHANES]    Frequency of Communication with Friends and Family: More than three times a week    Frequency of Social Gatherings with Friends and Family: Not on file    Attends Religious Services: Never    Active Member of Clubs or Organizations: No    Attends Banker Meetings: Never    Marital Status: Never married  Intimate Partner Violence: Not At Risk (04/10/2022)   Humiliation, Afraid, Rape, and Kick questionnaire    Fear of Current or Ex-Partner: No    Emotionally Abused: No    Physically Abused: No    Sexually Abused: No      Family History  Problem Relation Age of Onset   COPD Mother    Diabetes Mother    Heart disease Mother    Anxiety disorder Mother    Stroke Father    Schizophrenia Father    Diabetes Maternal Grandmother    Diabetes Maternal Grandfather    Cancer Maternal Grandfather    Diabetes Paternal Grandmother    Diabetes  Paternal Grandfather    Heart attack Paternal Grandfather    Vitals:   01/22/23 0855  BP: (!) 138/95  Pulse: 73  Resp: 16  SpO2: 96%  Weight: 167 lb 2 oz (75.8 kg)   Wt Readings from Last 3 Encounters:  01/22/23 167 lb 2 oz (75.8 kg)  01/17/23 167 lb 6.4 oz (75.9 kg)  09/23/22 199 lb 4.7 oz (90.4 kg)   Lab Results  Component Value Date   CREATININE 1.00 01/10/2023   CREATININE 0.68 09/24/2022   CREATININE 0.69 09/23/2022    PHYSICAL EXAM: General:  Well appearing. No respiratory difficulty HEENT: normal Neck: supple. no JVD. No lymphadenopathy or thyromegaly appreciated. Cor: PMI nondisplaced. Regular rate & rhythm. No rubs, gallops or murmurs. Lungs: expiratory wheezing throughout Abdomen: soft, nontender, nondistended. No hepatosplenomegaly. No bruits or masses.  Extremities: no cyanosis, clubbing, rash, edema Neuro: alert & oriented x 3, cranial nerves grossly intact. moves all 4 extremities w/o difficulty. Affect pleasant. Anxious  ECG: 09/22/22 was NSR with HR 95   ASSESSMENT & PLAN:  1: Chronic heart failure with preserved ejection fraction- - suspect due to HTN/ substance use - NYHA class III - euvolemic - scales provided today and instructed to weigh in the morning and call for an overnight weight gain of > 2 pounds or a weekly weight gain of >5 pounds - Echo 09/23/22: EF 55-60% - doesn't add much salt to his food; denies eating out frequently - continue carvedilol 3.125mg  BID - begin jardiance 10mg  daily as PA just got completed/approved - continue furosemide 20mg  daily PRN; hasn't had to take this recently - continue spironolactone 25mg  daily - increase valsartan to 80mg  daily; PA done today for entresto as would like to transition him to this - BMP at next visit - BNP 09/22/22 was 125.1 - PharmD reconciled meds w/ patient  2: HTN- - BP 138/95 - saw PCP Suzie Portela) 08/24 - continue amlodipine 5mg   daily - BMP 01/10/23 showed sodium 138, potassium 5.0,  creatinine 1.0 & GFR 97  3: Substance abuse- - follows at methadone clinic 3 days/ week - smokes 1/2 ppd of cigarettes  4: Moderate persistent asthma- - continue singulair 10mg  daily - continue advair 500/50 BID & make sure rinse/ spit after using this - he is going to f/u regarding pulm appt  5: Oral thrush- - continue nystatin but make sure it's used QID - begin diflucan 100mg  daily X 3 days  Return in 1 month, sooner if needed.  Delma Freeze, FNP 01/21/23

## 2023-01-22 ENCOUNTER — Ambulatory Visit: Payer: Medicaid Other | Attending: Family | Admitting: Family

## 2023-01-22 ENCOUNTER — Other Ambulatory Visit: Payer: Self-pay

## 2023-01-22 ENCOUNTER — Encounter: Payer: Self-pay | Admitting: Family

## 2023-01-22 ENCOUNTER — Encounter: Payer: Self-pay | Admitting: Pharmacist

## 2023-01-22 ENCOUNTER — Telehealth (HOSPITAL_COMMUNITY): Payer: Self-pay | Admitting: Pharmacy Technician

## 2023-01-22 ENCOUNTER — Other Ambulatory Visit (HOSPITAL_COMMUNITY): Payer: Self-pay

## 2023-01-22 VITALS — BP 138/95 | HR 73 | Resp 16 | Wt 167.1 lb

## 2023-01-22 DIAGNOSIS — J4489 Other specified chronic obstructive pulmonary disease: Secondary | ICD-10-CM

## 2023-01-22 DIAGNOSIS — F1120 Opioid dependence, uncomplicated: Secondary | ICD-10-CM | POA: Insufficient documentation

## 2023-01-22 DIAGNOSIS — B370 Candidal stomatitis: Secondary | ICD-10-CM | POA: Diagnosis not present

## 2023-01-22 DIAGNOSIS — F1911 Other psychoactive substance abuse, in remission: Secondary | ICD-10-CM

## 2023-01-22 DIAGNOSIS — I110 Hypertensive heart disease with heart failure: Secondary | ICD-10-CM | POA: Insufficient documentation

## 2023-01-22 DIAGNOSIS — F17210 Nicotine dependence, cigarettes, uncomplicated: Secondary | ICD-10-CM | POA: Insufficient documentation

## 2023-01-22 DIAGNOSIS — J4540 Moderate persistent asthma, uncomplicated: Secondary | ICD-10-CM | POA: Diagnosis not present

## 2023-01-22 DIAGNOSIS — F319 Bipolar disorder, unspecified: Secondary | ICD-10-CM | POA: Insufficient documentation

## 2023-01-22 DIAGNOSIS — I10 Essential (primary) hypertension: Secondary | ICD-10-CM | POA: Diagnosis not present

## 2023-01-22 DIAGNOSIS — I5032 Chronic diastolic (congestive) heart failure: Secondary | ICD-10-CM | POA: Insufficient documentation

## 2023-01-22 DIAGNOSIS — I509 Heart failure, unspecified: Secondary | ICD-10-CM | POA: Diagnosis present

## 2023-01-22 MED ORDER — VALSARTAN 80 MG PO TABS
80.0000 mg | ORAL_TABLET | Freq: Every day | ORAL | 3 refills | Status: DC
Start: 2023-01-22 — End: 2023-05-01
  Filled 2023-01-22: qty 30, 30d supply, fill #0
  Filled 2023-02-07: qty 30, 30d supply, fill #1
  Filled 2023-04-02: qty 30, 30d supply, fill #2

## 2023-01-22 MED ORDER — FLUCONAZOLE 100 MG PO TABS
100.0000 mg | ORAL_TABLET | Freq: Every day | ORAL | 0 refills | Status: DC
  Filled 2023-01-22: qty 3, 3d supply, fill #0

## 2023-01-22 NOTE — Telephone Encounter (Signed)
Pharmacy Patient Advocate Encounter  Received notification from [facility] MEDICAID that Prior Authorization for Jardiance 10MG   has been APPROVED from 01/02/2023 to 01/02/2024   PA #/Case ID/Reference #: [id number]

## 2023-01-22 NOTE — TOC Benefit Eligibility Note (Addendum)
Patient Product/process development scientist completed.    The patient is insured through [facility] MEDICAID.     Ran test claim for Entresto 24-26 mg and Requires Prior Authorization  Ran test claim for Farxiga 10 mg and Requires Prior Authorization  Ran test claim for Jardiance 10 mg and Requires Prior Authorization   This test claim was processed through [employer]- copay amounts may vary at other pharmacies due to [employer], or as the patient moves through the different stages of their insurance plan.     Roland Earl, CPHT Pharmacy Technician III Certified Patient Advocate [facility] Pharmacy Patient Advocate Team Direct Number: [phone]  Fax: [phone]

## 2023-01-22 NOTE — Patient Instructions (Signed)
Take the diflucan once daily for 3 days. Continue nystatin  Finish your current valsartan bottle by taking 2 tablets daily. Your new bottle will be 80mg  and then you will resume taking 1 tablet daily.

## 2023-01-22 NOTE — Telephone Encounter (Signed)
Pharmacy Patient Advocate Encounter   Received notification that prior authorization for Jardiance 10MG  tablets is required/requested.   Insurance verification completed.   The patient is insured through [facility] MEDICAID .   Per test claim: PA required; PA submitted to [facility] MEDICAID via CoverMyMeds Key/confirmation #/EOC [id number] Status is pending

## 2023-01-24 ENCOUNTER — Ambulatory Visit: Payer: Medicaid Other | Admitting: Family Medicine

## 2023-01-24 ENCOUNTER — Other Ambulatory Visit: Payer: Self-pay

## 2023-01-29 ENCOUNTER — Other Ambulatory Visit: Payer: Medicaid Other

## 2023-01-29 NOTE — Patient Outreach (Signed)
  Medicaid Managed Care Social Work Note  01/29/2023 Name:  Nathan Vasquez MRN:  [id number] DOB:  December 19, 1980  Nathan Vasquez is an 42 y.o. year old male who is a primary patient of Jacky Kindle, FNP.  The [facility] Managed Care Coordination team was consulted for assistance with:  Community Resources   Nathan Vasquez was given information about Medicaid Managed Care Coordination team services today. Nathan Vasquez Patient agreed to services and verbal consent obtained.  Engaged with patient  for by telephone forinitial visit in response to referral for case management and/or care coordination services.   Assessments/Interventions:  Review of past medical history, allergies, medications, health status, including review of consultants reports, laboratory and other test data, was performed as part of comprehensive evaluation and provision of chronic care management services.  SDOH: (Social Determinant of Health) assessments and interventions performed: SDOH Interventions    Flowsheet Row Office Visit from 12/31/2021 in [facility] Treatment Planning from 08/16/2021 [facility] Treatment Planning from 08/08/2021 in [facility] Office Visit from 07/31/2021 in [facility] Integrated Behavioral Health from 09/23/2019 in [facility]  SDOH Interventions       Housing Interventions -- -- -- Intervention Not Indicated --  Transportation Interventions -- -- -- Other (Comment)  [relies on mother for transportation] --  Depression Interventions/Treatment  Patient refuses Treatment  [Provider notified] Referral to Psychiatry Currently on Treatment -- Referral to Psychiatry     BSW completed a telepphone outreach with patient, he states he needs to know how to apply for disability due to his recent heart failure diagnosis. Patient states is recently got out of prison and he does  not have any income. He is currently living with his mother, but does not know for how long. Patient is having a hard time affording his medications due to no income. Patient is unable to get foodstamps due to record. BSW will email resources to patient to [email or web address].  Advanced Directives Status:  Not addressed in this encounter.  Care Plan                 Allergies  Allergen Reactions   Penicillins Rash    Medications Reviewed Today   Medications were not reviewed in this encounter     Patient Active Problem List   Diagnosis Date Noted   Encounter for screening involving social determinants of health (SDoH) 01/17/2023   Kidney stone 01/17/2023   Tobacco dependence due to cigarettes 01/10/2023   Mood disorder (HCC) 01/10/2023   History of substance abuse (HCC) 01/10/2023   Asthma with chronic obstructive pulmonary disease (COPD) 01/10/2023   Heart failure with mildly reduced ejection fraction (HFmrEF) (HCC) 01/10/2023   Closed fracture of proximal end of left humerus 08/05/2022   Essential hypertension 07/31/2021    Conditions to be addressed/monitored per PCP order:   community resources  There are no care plans that you recently modified to display for this patient.   Follow up:  Patient agrees to Care Plan and Follow-up.  Plan: The Managed Medicaid care management team will reach out to the patient again over the next 30 days.  Date/time of next scheduled Social Work care management/care coordination outreach:  02/28/23  [name], [name], [facility]  Managed [facility] Social Worker [phone]

## 2023-01-29 NOTE — Patient Instructions (Signed)
Visit Information  Nathan Vasquez was given information about Medicaid Managed Care team care coordination services as a part of their [facility] Community Plan Medicaid benefit. Nathan Vasquez verbally consented to engagement with the [facility] Managed Care team.   If you are experiencing a medical emergency, please call 911 or report to your local emergency department or urgent care.   If you have a non-emergency medical problem during routine business hours, please contact your provider's office and ask to speak with a nurse.   For questions related to your [facility], please call: [phone] or visit the homepage here: [email or web address]  If you would like to schedule transportation through your [facility], please call the following number at least 2 days in advance of your appointment: [phone]   Rides for urgent appointments can also be made after hours by calling Member Services.  Call the Behavioral Health Crisis Line at 2403104453, at any time, 24 hours a day, 7 days a week. If you are in danger or need immediate medical attention call 911.  If you would like help to quit smoking, call 1-800-QUIT-NOW (6064680076) OR Espaol: 1-855-Djelo-Ya (3-220-254-2706) o para ms informacin haga clic aqu or Text READY to 237-628 to register via text  [name] - following are the goals we discussed in your visit today:   Goals Addressed   None      Social Worker will follow up on 02/28/23.   [name], [name], MHA [facility]  Managed Medicaid Social Worker [phone]   Following is a copy of your plan of care:  There are no care plans that you recently modified to display for this patient.

## 2023-02-05 NOTE — Progress Notes (Signed)
[facility] - HEART FAILURE CLINIC - PHARMACIST COUNSELING NOTE  Guideline-Directed Medical Therapy/Evidence Based Medicine  ACE/ARB/ARNI: Valsartan 40 mg daily Beta Blocker: Carvedilol 3.125mg  mg twice daily Aldosterone Antagonist: Spironolactone 25 mg daily Diuretic: Furosemide 20 mg daily PRN SGLT2i: Empagliflozin 10 mg daily  *also on amlodipine 5mg  daily*  Adherence Assessment  Do you ever forget to take your medication? [] Yes [x] No  Do you ever skip doses due to side effects? [] Yes [x] No  Do you have trouble affording your medicines? [] Yes [x] No  Are you ever unable to pick up your medication due to transportation difficulties? [] Yes [x] No  Do you ever stop taking your medications because you don't believe they are helping? [] Yes [x] No  Do you check your weight daily? [] Yes [x] No   Barriers to obtaining medications: already used 30 day free card for Jardiance, need PA  Vital signs: HR 73, BP 139/95, weight (pounds) 167 lbs  ECHO: 09/23/22: EF 55-60%      Latest Ref Rng & Units 01/10/2023    2:47 PM 09/24/2022    5:59 AM 09/23/2022    4:54 AM  BMP  Glucose 70 - 99 mg/dL 72  83  628   BUN 6 - 24 mg/dL 8  15  13    Creatinine 0.76 - 1.27 mg/dL 3.15  1.76  1.60   BUN/Creat Ratio 9 - 20 8     Sodium 134 - 144 mmol/L 138  134  135   Potassium 3.5 - 5.2 mmol/L 5.0  3.6  4.3   Chloride 96 - 106 mmol/L 96  96  94   CO2 20 - 29 mmol/L 29  31  34   Calcium 8.7 - 10.2 mg/dL 9.9  8.2  8.5     Past Medical History:  Diagnosis Date   Asthma    COPD (chronic obstructive pulmonary disease) (HCC)    Heart failure (HCC)    Hep C w/o coma, chronic (HCC)    Heroin abuse (HCC)    History of balanitis    Hypertension     ASSESSMENT 42 year old male who presents to the HF clinic for initial visit. PMH includes HTN, bipolar disorder, moderate persistent asthma, hepatitis C with SVR, opiod use disorder, anxiety, tobacco use and chronic heart failure.  Last ED  admission was 09/22/2022 for low extremity edema and dyspnea on exertion. Only complaint today is persistent thrush in his mouth unresponsive to nystatin.  MedREc completed today. Patient needs Building services engineer for [place]. Jardiance PA completed and approved this AM and refill send to pharmacy during OV. He denies problems with any medication or ARD. Discussed rinsing and preferably washing mouth after using Advair to avoid problems with mouth thrush.   PLAN Refill Jardiance today Increase valsartan to 80mg  daily (consider change to [facility] during next OV). Will start prior-authorization today. Rx for fluconazole 100mg  po x 3 doses and follow up with dentist. Repeat BMET in 2 weeks Follow up with clinic as directed by NP  Time spent: 20 minutes  Turhan Chill Rodriguez-Guzman PharmD, BCPS 03/13/2023 2:08 PM   Current Outpatient Medications:    albuterol (PROVENTIL) (2.5 MG/3ML) 0.083% nebulizer solution, Take 2.5 mg by nebulization every 6 (six) hours as needed for wheezing or shortness of breath., Disp: , Rfl:    albuterol (VENTOLIN HFA) 108 (90 Base) MCG/ACT inhaler, Inhale 1-2 puffs into the lungs every 6 (six) hours as needed for wheezing or shortness of breath., Disp: , Rfl:    amLODipine (  NORVASC) 5 MG tablet, Take 1 tablet (5 mg total) by mouth daily., Disp: 30 tablet, Rfl: 0   carvedilol (COREG) 3.125 MG tablet, Take 1 tablet (3.125 mg total) by mouth 2 (two) times daily with a meal., Disp: 60 tablet, Rfl: 0   colchicine 0.6 MG tablet, Take 1 tablet (0.6 mg total) by mouth 2 (two) times daily. (Patient not taking: Reported on 01/22/2023), Disp: 10 tablet, Rfl: 0   DULoxetine (CYMBALTA) 30 MG capsule, Take 1 capsule (30 mg total) by mouth 2 (two) times daily., Disp: 60 capsule, Rfl: 0   empagliflozin (JARDIANCE) 10 MG TABS tablet, Take 1 tablet (10 mg total) by mouth daily before breakfast. (Patient not taking: Reported on 01/22/2023), Disp: 30 tablet, Rfl: 0    fluconazole (DIFLUCAN) 100 MG tablet, Take 1 tablet (100 mg total) by mouth daily., Disp: 3 tablet, Rfl: 0   fluticasone-salmeterol (ADVAIR DISKUS) 500-50 MCG/ACT AEPB, Inhale 2 puffs into the lungs in the morning and at bedtime. May decrease to 1 puff in the morning and at bedtime after a couple of weeks depending on symptoms., Disp: 120 each, Rfl: 0   furosemide (LASIX) 20 MG tablet, Take 1 tablet (20 mg total) by mouth daily as needed for edema or fluid. (Patient not taking: Reported on 01/22/2023), Disp: 30 tablet, Rfl: 0   METHADONE HCL PO, Take 120 mg by mouth daily., Disp: , Rfl:    montelukast (SINGULAIR) 10 MG tablet, Take 1 tablet (10 mg total) by mouth once nightly at bedtime., Disp: 30 tablet, Rfl: 0   nystatin (MYCOSTATIN) 100000 UNIT/ML suspension, Take 5 mLs by mouth 4 (four) times daily., Disp: , Rfl:    spironolactone (ALDACTONE) 25 MG tablet, Take 1 tablet (25 mg total) by mouth daily., Disp: 30 tablet, Rfl: 0   tamsulosin (FLOMAX) 0.4 MG CAPS capsule, Take 2 capsules (0.8 mg total) by mouth daily. (Patient not taking: Reported on 01/22/2023), Disp: 60 capsule, Rfl: 0   valsartan (DIOVAN) 80 MG tablet, Take 1 tablet (80 mg total) by mouth daily., Disp: 30 tablet, Rfl: 3   MEDICATION ADHERENCES TIPS AND STRATEGIES Taking medication as prescribed improves patient outcomes in heart failure (reduces hospitalizations, improves symptoms, increases survival) Side effects of medications can be managed by decreasing doses, switching agents, stopping drugs, or adding additional therapy. Please let someone in the Heart Failure Clinic know if you have having bothersome side effects so we can modify your regimen. Do not alter your medication regimen without talking to [place].  Medication reminders can help patients remember to take drugs on time. If you are missing or forgetting doses you can try linking behaviors, using pill boxes, or an electronic reminder like an alarm on your phone or an app. Some  people can also get automated phone calls as medication reminders. [name]

## 2023-02-07 ENCOUNTER — Other Ambulatory Visit: Payer: Self-pay | Admitting: Family Medicine

## 2023-02-07 ENCOUNTER — Other Ambulatory Visit: Payer: Self-pay

## 2023-02-07 DIAGNOSIS — N200 Calculus of kidney: Secondary | ICD-10-CM

## 2023-02-07 DIAGNOSIS — I5022 Chronic systolic (congestive) heart failure: Secondary | ICD-10-CM

## 2023-02-07 DIAGNOSIS — F39 Unspecified mood [affective] disorder: Secondary | ICD-10-CM

## 2023-02-07 DIAGNOSIS — J4489 Other specified chronic obstructive pulmonary disease: Secondary | ICD-10-CM

## 2023-02-07 DIAGNOSIS — I10 Essential (primary) hypertension: Secondary | ICD-10-CM

## 2023-02-10 ENCOUNTER — Other Ambulatory Visit: Payer: Self-pay

## 2023-02-10 MED ORDER — AMLODIPINE BESYLATE 5 MG PO TABS
5.0000 mg | ORAL_TABLET | Freq: Every day | ORAL | 1 refills | Status: DC
  Filled 2023-02-10: qty 90, 90d supply, fill #0
  Filled 2023-05-07: qty 90, 90d supply, fill #1

## 2023-02-10 MED ORDER — COLCHICINE 0.6 MG PO TABS
0.6000 mg | ORAL_TABLET | Freq: Two times a day (BID) | ORAL | 0 refills | Status: DC
  Filled 2023-02-10: qty 10, 5d supply, fill #0

## 2023-02-10 MED ORDER — MONTELUKAST SODIUM 10 MG PO TABS
10.0000 mg | ORAL_TABLET | Freq: Every day | ORAL | 1 refills | Status: DC
  Filled 2023-02-10: qty 90, 90d supply, fill #0
  Filled 2023-05-07: qty 90, 90d supply, fill #1

## 2023-02-10 MED ORDER — EMPAGLIFLOZIN 10 MG PO TABS
10.0000 mg | ORAL_TABLET | Freq: Every day | ORAL | 1 refills | Status: DC
Start: 2023-02-10 — End: 2023-05-30
  Filled 2023-02-10 – 2023-02-24 (×2): qty 90, 90d supply, fill #0
  Filled 2023-05-07 – 2023-05-30 (×3): qty 90, 90d supply, fill #1

## 2023-02-10 MED ORDER — CARVEDILOL 3.125 MG PO TABS
3.1250 mg | ORAL_TABLET | Freq: Two times a day (BID) | ORAL | 1 refills | Status: DC
  Filled 2023-02-10: qty 180, 90d supply, fill #0

## 2023-02-10 MED ORDER — DULOXETINE HCL 30 MG PO CPEP
30.0000 mg | ORAL_CAPSULE | Freq: Two times a day (BID) | ORAL | 1 refills | Status: DC
Start: 2023-02-10 — End: 2023-07-25
  Filled 2023-02-10: qty 180, 90d supply, fill #0

## 2023-02-10 MED ORDER — SPIRONOLACTONE 25 MG PO TABS
25.0000 mg | ORAL_TABLET | Freq: Every day | ORAL | 1 refills | Status: DC
  Filled 2023-02-10: qty 90, 90d supply, fill #0

## 2023-02-10 NOTE — Telephone Encounter (Signed)
Requested Prescriptions  Pending Prescriptions Disp Refills   amLODipine (NORVASC) 5 MG tablet 90 tablet 1    Sig: Take 1 tablet (5 mg total) by mouth daily.     Cardiovascular: Calcium Channel Blockers 2 Failed - 02/07/2023  1:24 PM      Failed - Last BP in normal range    BP Readings from Last 1 Encounters:  01/22/23 (!) 138/95         Passed - Last Heart Rate in normal range    Pulse Readings from Last 1 Encounters:  01/22/23 73         Passed - Valid encounter within last 6 months    Recent Outpatient Visits           3 weeks ago Heart failure with mildly reduced ejection fraction (HFmrEF) [facility])   [place] [facility] Merita Norton T, FNP   1 month ago Heart failure with mildly reduced ejection fraction (HFmrEF) [facility])   [place] [facility] Merita Norton T, FNP       Future Appointments             In 1 month End, Cristal Deer, MD  HeartCare at [facility]             spironolactone (ALDACTONE) 25 MG tablet 90 tablet 1    Sig: Take 1 tablet (25 mg total) by mouth daily.     Cardiovascular: Diuretics - Aldosterone Antagonist Failed - 02/07/2023  1:24 PM      Failed - Last BP in normal range    BP Readings from Last 1 Encounters:  01/22/23 (!) 138/95         Passed - Cr in normal range and within 180 days    Creatinine  Date Value Ref Range Status  09/06/2014 0.92 mg/dL Final    Comment:    8.41-3.24 NOTE: New Reference Range  08/02/14    Creatinine, Ser  Date Value Ref Range Status  01/10/2023 1.00 0.76 - 1.27 mg/dL Final         Passed - K in normal range and within 180 days    Potassium  Date Value Ref Range Status  01/10/2023 5.0 3.5 - 5.2 mmol/L Final  09/06/2014 4.4 mmol/L Final    Comment:    3.5-5.1 NOTE: New Reference Range  08/02/14          Passed - Na in normal range and within 180 days    Sodium  Date Value Ref Range Status  01/10/2023 138 134 - 144 mmol/L Final  09/06/2014 138  mmol/L Final    Comment:    135-145 NOTE: New Reference Range  08/02/14          Passed - eGFR is 30 or above and within 180 days    EGFR (African American)  Date Value Ref Range Status  09/06/2014 >60  Final   GFR calc Af Amer  Date Value Ref Range Status  09/30/2019 127 >59 mL/min/1.73 Final    Comment:    **[facility] currently reports eGFR in compliance with the current**   recommendations of the [employer]. [facility] will   update reporting as new guidelines are published from the NKF-ASN   Task force.    EGFR (Non-African Amer.)  Date Value Ref Range Status  09/06/2014 >60  Final    Comment:    eGFR values <60mL/min/1.73 m2 may be an indication of chronic kidney disease (CKD). Calculated eGFR is useful in patients  with stable renal function. The eGFR calculation will not be reliable in acutely ill patients when serum creatinine is changing rapidly. It is not useful in patients on dialysis. The eGFR calculation may not be applicable to patients at the low and high extremes of body sizes, pregnant women, and vegetarians.    GFR, Estimated  Date Value Ref Range Status  09/24/2022 >60 >60 mL/min Final    Comment:    (NOTE) Calculated using the CKD-EPI Creatinine Equation (2021)    eGFR  Date Value Ref Range Status  01/10/2023 97 >59 mL/min/1.73 Final         Passed - Valid encounter within last 6 months    Recent Outpatient Visits           3 weeks ago Heart failure with mildly reduced ejection fraction (HFmrEF) (HCC)   [place] [facility] Merita Norton T, FNP   1 month ago Heart failure with mildly reduced ejection fraction (HFmrEF) [facility])    [facility] Merita Norton T, FNP       Future Appointments             In 1 month End, Cristal Deer, MD  HeartCare at [facility]             empagliflozin (JARDIANCE) 10 MG TABS tablet 90 tablet 1    Sig: Take 1 tablet (10 mg total) by mouth  daily before breakfast.     Endocrinology:  Diabetes - SGLT2 Inhibitors Passed - 02/07/2023  1:24 PM      Passed - Cr in normal range and within 360 days    Creatinine  Date Value Ref Range Status  09/06/2014 0.92 mg/dL Final    Comment:    0.27-2.53 NOTE: New Reference Range  08/02/14    Creatinine, Ser  Date Value Ref Range Status  01/10/2023 1.00 0.76 - 1.27 mg/dL Final         Passed - HBA1C is between 0 and 7.9 and within 180 days    Hgb A1c MFr Bld  Date Value Ref Range Status  01/10/2023 5.0 4.8 - 5.6 % Final    Comment:             Prediabetes: 5.7 - 6.4          Diabetes: >6.4          Glycemic control for adults with diabetes: <7.0          Passed - eGFR in normal range and within 360 days    EGFR (African American)  Date Value Ref Range Status  09/06/2014 >60  Final   GFR calc Af Amer  Date Value Ref Range Status  09/30/2019 127 >59 mL/min/1.73 Final    Comment:    **[facility] currently reports eGFR in compliance with the current**   recommendations of the [employer]. [facility] will   update reporting as new guidelines are published from the NKF-ASN   Task force.    EGFR (Non-African Amer.)  Date Value Ref Range Status  09/06/2014 >60  Final    Comment:    eGFR values <62mL/min/1.73 m2 may be an indication of chronic kidney disease (CKD). Calculated eGFR is useful in patients with stable renal function. The eGFR calculation will not be reliable in acutely ill patients when serum creatinine is changing rapidly. It is not useful in patients on dialysis. The eGFR calculation may not be applicable to patients at the low and high extremes of body sizes,  pregnant women, and vegetarians.    GFR, Estimated  Date Value Ref Range Status  09/24/2022 >60 >60 mL/min Final    Comment:    (NOTE) Calculated using the CKD-EPI Creatinine Equation (2021)    eGFR  Date Value Ref Range Status  01/10/2023 97 >59 mL/min/1.73 Final         Passed  - Valid encounter within last 6 months    Recent Outpatient Visits           3 weeks ago Heart failure with mildly reduced ejection fraction (HFmrEF) (HCC)   [facility] Merita Norton T, FNP   1 month ago Heart failure with mildly reduced ejection fraction (HFmrEF) [facility])   [place] [facility] Merita Norton T, FNP       Future Appointments             In 1 month End, Cristal Deer, MD [place] [facility]             carvedilol (COREG) 3.125 MG tablet 180 tablet 1    Sig: Take 1 tablet (3.125 mg total) by mouth 2 (two) times daily with a meal.     Cardiovascular: Beta Blockers 3 Failed - 02/07/2023  1:24 PM      Failed - Last BP in normal range    BP Readings from Last 1 Encounters:  01/22/23 (!) 138/95         Passed - Cr in normal range and within 360 days    Creatinine  Date Value Ref Range Status  09/06/2014 0.92 mg/dL Final    Comment:    2.95-2.84 NOTE: New Reference Range  08/02/14    Creatinine, Ser  Date Value Ref Range Status  01/10/2023 1.00 0.76 - 1.27 mg/dL Final         Passed - AST in normal range and within 360 days    AST  Date Value Ref Range Status  01/10/2023 16 0 - 40 IU/L Final   SGOT(AST)  Date Value Ref Range Status  09/06/2014 30 U/L Final    Comment:    15-41 NOTE: New Reference Range  08/02/14          Passed - ALT in normal range and within 360 days    ALT  Date Value Ref Range Status  01/10/2023 15 0 - 44 IU/L Final  06/28/2021 33 9 - 46 U/L Final         Passed - Last Heart Rate in normal range    Pulse Readings from Last 1 Encounters:  01/22/23 73         Passed - Valid encounter within last 6 months    Recent Outpatient Visits           3 weeks ago Heart failure with mildly reduced ejection fraction (HFmrEF) [facility])   [place] [facility] Merita Norton T, FNP   1 month ago Heart failure with mildly reduced ejection fraction (HFmrEF) [facility])    [place] [facility] Merita Norton T, FNP       Future Appointments             In 1 month End, Cristal Deer, MD  [facility] at [facility]             montelukast (SINGULAIR) 10 MG tablet 90 tablet 1    Sig: Take 1 tablet (10 mg total) by mouth once nightly at bedtime.     Pulmonology:  Leukotriene  Inhibitors Passed - 02/07/2023  1:24 PM      Passed - Valid encounter within last 12 months    Recent Outpatient Visits           3 weeks ago Heart failure with mildly reduced ejection fraction (HFmrEF) [facility])   [facility] Merita Norton T, FNP   1 month ago Heart failure with mildly reduced ejection fraction (HFmrEF) [facility])   [place] [facility] Merita Norton T, FNP       Future Appointments             In 1 month End, Cristal Deer, MD [place] [facility] at [facility]             DULoxetine (CYMBALTA) 30 MG capsule 180 capsule 1    Sig: Take 1 capsule (30 mg total) by mouth 2 (two) times daily.     Psychiatry: Antidepressants - SNRI - duloxetine Failed - 02/07/2023  1:24 PM      Failed - Completed PHQ-2 or PHQ-9 in the last 360 days      Failed - Last BP in normal range    BP Readings from Last 1 Encounters:  01/22/23 (!) 138/95         Passed - Cr in normal range and within 360 days    Creatinine  Date Value Ref Range Status  09/06/2014 0.92 mg/dL Final    Comment:    2.59-5.63 NOTE: New Reference Range  08/02/14    Creatinine, Ser  Date Value Ref Range Status  01/10/2023 1.00 0.76 - 1.27 mg/dL Final         Passed - eGFR is 30 or above and within 360 days    EGFR (African American)  Date Value Ref Range Status  09/06/2014 >60  Final   GFR calc Af Amer  Date Value Ref Range Status  09/30/2019 127 >59 mL/min/1.73 Final    Comment:    **[facility] currently reports eGFR in compliance with the current**   recommendations of the [employer]. [facility] will   update  reporting as new guidelines are published from the NKF-ASN   Task force.    EGFR (Non-African Amer.)  Date Value Ref Range Status  09/06/2014 >60  Final    Comment:    eGFR values <83mL/min/1.73 m2 may be an indication of chronic kidney disease (CKD). Calculated eGFR is useful in patients with stable renal function. The eGFR calculation will not be reliable in acutely ill patients when serum creatinine is changing rapidly. It is not useful in patients on dialysis. The eGFR calculation may not be applicable to patients at the low and high extremes of body sizes, pregnant women, and vegetarians.    GFR, Estimated  Date Value Ref Range Status  09/24/2022 >60 >60 mL/min Final    Comment:    (NOTE) Calculated using the CKD-EPI Creatinine Equation (2021)    eGFR  Date Value Ref Range Status  01/10/2023 97 >59 mL/min/1.73 Final         Passed - Valid encounter within last 6 months    Recent Outpatient Visits           3 weeks ago Heart failure with mildly reduced ejection fraction (HFmrEF) [facility])   [place] [facility] Merita Norton T, FNP   1 month ago Heart failure with mildly reduced ejection fraction (HFmrEF) [facility])   [facility] Jacky Kindle, [place]       Future  Appointments             In 1 month End, Cristal Deer, MD [place] [facility] at [facility]             colchicine 0.6 MG tablet 10 tablet 0    Sig: Take 1 tablet (0.6 mg total) by mouth 2 (two) times daily.     Endocrinology:  Gout Agents - colchicine Passed - 02/07/2023  1:24 PM      Passed - Cr in normal range and within 360 days    Creatinine  Date Value Ref Range Status  09/06/2014 0.92 mg/dL Final    Comment:    2.95-1.88 NOTE: New Reference Range  08/02/14    Creatinine, Ser  Date Value Ref Range Status  01/10/2023 1.00 0.76 - 1.27 mg/dL Final         Passed - ALT in normal range and within 360 days    ALT  Date Value Ref Range Status   01/10/2023 15 0 - 44 IU/L Final  06/28/2021 33 9 - 46 U/L Final         Passed - AST in normal range and within 360 days    AST  Date Value Ref Range Status  01/10/2023 16 0 - 40 IU/L Final   SGOT(AST)  Date Value Ref Range Status  09/06/2014 30 U/L Final    Comment:    15-41 NOTE: New Reference Range  08/02/14          Passed - Valid encounter within last 12 months    Recent Outpatient Visits           3 weeks ago Heart failure with mildly reduced ejection fraction (HFmrEF) (HCC)   [place] [facility] Merita Norton T, FNP   1 month ago Heart failure with mildly reduced ejection fraction (HFmrEF) [facility])   [place] [facility] Merita Norton T, FNP       Future Appointments             In 1 month End, Cristal Deer, MD [place] [facility] at [facility] - CBC within normal limits and completed in the last 12 months    WBC  Date Value Ref Range Status  01/10/2023 4.6 3.4 - 10.8 x10E3/uL Final  09/24/2022 13.7 (H) 4.0 - 10.5 K/uL Final   RBC  Date Value Ref Range Status  01/10/2023 5.65 4.14 - 5.80 x10E6/uL Final  09/24/2022 4.86 4.22 - 5.81 MIL/uL Final   Hemoglobin  Date Value Ref Range Status  01/10/2023 16.1 13.0 - 17.7 g/dL Final   Hematocrit  Date Value Ref Range Status  01/10/2023 49.9 37.5 - 51.0 % Final   MCHC  Date Value Ref Range Status  01/10/2023 32.3 31.5 - 35.7 g/dL Final  41/66/0630 16.0 30.0 - 36.0 g/dL Final   [facility]  Date Value Ref Range Status  01/10/2023 28.5 26.6 - 33.0 pg Final  09/24/2022 27.8 26.0 - 34.0 pg Final   MCV  Date Value Ref Range Status  01/10/2023 88 79 - 97 fL Final  09/06/2014 93 80 - 100 fL Final   No results found for: "PLTCOUNTKUC", "LABPLAT", "POCPLA" RDW  Date Value Ref Range Status  01/10/2023 13.8 11.6 - 15.4 % Final  09/06/2014 14.1 11.5 - 14.5 % Final

## 2023-02-14 ENCOUNTER — Other Ambulatory Visit: Payer: Self-pay

## 2023-02-21 NOTE — Progress Notes (Unsigned)
Advanced Heart Failure Clinic Note   PCP: Jacky Kindle, FNP (last seen 08/24) Cardiologist: to see End, Cristal Deer, MD 10/24  HPI:  Nathan Vasquez is a 42 y/o male with a history of HTN, bipolar disorder, moderate persistent asthma, hepatitis C with SVR, opiod use disorder, anxiety, tobacco use and chronic heart failure. Broke his left humerus 02/24 after a fall.   Was in the ED 07/25/22 after falling. Xray confirmed left humeral neck fracture. Placed arm in sling with orthpaedic f/u. Admitted 09/22/22 due to lower extremity swelling, generalized fatigue, dyspnea on exertion. Currently follows at outpatient methadone clinic but recently use fentanyl. Upon arrival noted to be hypoxic requiring 6 L nasal cannula, CTA chest was negative for PE but showed bilateral nodular opacity. Patient was started on azithromycin, Rocephin and diuretics. BNP minimally elevated. Procalcitonin is negative. COVID-negative.  Full respiratory panel-Neg   Echo 09/23/22: EF 55-60%  He presents today for a HF f/u visit with a chief complaint of minimal shortness of breath with moderate exertion. Chronic in nature. Has associated minimal fatigue, dry cough and occasional palpitations along with this. Denies chest pain, abdominal distention, pedal edema, dizziness, weight gain or difficulty sleeping.   At last visit valsartan was increased to 80mg  daily, jardiance was started & diflucan (for thrush) was given for 3 days. No issues with these medications. He does say that he had to stop taking the carvedilol because he was experiencing severe fatigue where he was unable to get out of the bed. He stopped the carvedilol and is now feeling "much better".   Currently lives with his mom and his bedroom is upstairs. Says that he's able to walk up the stairs now with less SOB than previously. Is currently unable to work. Broke his left arm after a fall 02/24 and said that the pain got so bad that he would use "whatever he could  get, heroin, fentanyl etc". Is now going to the methadone clinic 3 days/ week.   Smokes 5-10 cigarettes and is trying to quit. Denies alcohol use.   ROS: All systems negative except as listed in HPI, PMH and Problem List.  SH:  Social History   Socioeconomic History   Marital status: Single    Spouse name: Not on file   Number of children: 1   Years of education: 6th grade   Highest education level: 6th grade  Occupational History   Occupation: NA  Tobacco Use   Smoking status: Every Day    Current packs/day: 0.50    Average packs/day: 0.5 packs/day for 26.0 years (13.0 ttl pk-yrs)    Types: Cigarettes   Smokeless tobacco: Current  Vaping Use   Vaping status: Former   Quit date: 12/21/2019  Substance and Sexual Activity   Alcohol use: Yes    Alcohol/week: 1.0 standard drink of alcohol    Types: 1 Cans of beer per week    Comment: seldom, once every 3-6 months   Drug use: Not Currently    Frequency: 7.0 times per week    Types: Heroin, Marijuana, Cocaine    Comment: last use 11/26/21 heroin.   Sexual activity: Yes    Partners: Female  Other Topics Concern   Not on file  Social History Narrative   Not on file   Social Determinants of Health   Financial Resource Strain: High Risk (04/10/2022)   Overall Financial Resource Strain (CARDIA)    Difficulty of Paying Living Expenses: Very hard  Food Insecurity: Food Insecurity  Present (01/29/2023)   Hunger Vital Sign    Worried About Running Out of Food in the Last Year: Sometimes true    Ran Out of Food in the Last Year: Sometimes true  Transportation Needs: No Transportation Needs (01/29/2023)   PRAPARE - Administrator, Civil Service (Medical): No    Lack of Transportation (Non-Medical): No  Physical Activity: Inactive (04/10/2022)   Exercise Vital Sign    Days of Exercise per Week: 0 days    Minutes of Exercise per Session: 0 min  Stress: Stress Concern Present (04/10/2022)   [employer] of Occupational  Health - Occupational Stress Questionnaire    Feeling of Stress : Very much  Social Connections: Socially Isolated (04/10/2022)   Social Connection and Isolation Panel [NHANES]    Frequency of Communication with Friends and Family: More than three times a week    Frequency of Social Gatherings with Friends and Family: Not on file    Attends Religious Services: Never    Active Member of Clubs or Organizations: No    Attends Banker Meetings: Never    Marital Status: Never married  Intimate Partner Violence: Not At Risk (04/10/2022)   Humiliation, Afraid, Rape, and Kick questionnaire    Fear of Current or Ex-Partner: No    Emotionally Abused: No    Physically Abused: No    Sexually Abused: No    FH:  Family History  Problem Relation Age of Onset   COPD Mother    Diabetes Mother    Heart disease Mother    Anxiety disorder Mother    Stroke Father    Schizophrenia Father    Diabetes Maternal Grandmother    Diabetes Maternal Grandfather    Cancer Maternal Grandfather    Diabetes Paternal Grandmother    Diabetes Paternal Grandfather    Heart attack Paternal Grandfather     Past Medical History:  Diagnosis Date   Asthma    COPD (chronic obstructive pulmonary disease) (HCC)    Heart failure (HCC)    Hep C w/o coma, chronic (HCC)    Heroin abuse (HCC)    History of balanitis    Hypertension     Current Outpatient Medications  Medication Sig Dispense Refill   albuterol (PROVENTIL) (2.5 MG/3ML) 0.083% nebulizer solution Take 2.5 mg by nebulization every 6 (six) hours as needed for wheezing or shortness of breath.     albuterol (VENTOLIN HFA) 108 (90 Base) MCG/ACT inhaler Inhale 1-2 puffs into the lungs every 6 (six) hours as needed for wheezing or shortness of breath.     amLODipine (NORVASC) 5 MG tablet Take 1 tablet (5 mg total) by mouth daily. 90 tablet 1   carvedilol (COREG) 3.125 MG tablet Take 1 tablet (3.125 mg total) by mouth 2 (two) times daily with a  meal. 180 tablet 1   colchicine 0.6 MG tablet Take 1 tablet (0.6 mg total) by mouth 2 (two) times daily. 10 tablet 0   DULoxetine (CYMBALTA) 30 MG capsule Take 1 capsule (30 mg total) by mouth 2 (two) times daily. 180 capsule 1   empagliflozin (JARDIANCE) 10 MG TABS tablet Take 1 tablet (10 mg total) by mouth daily before breakfast. 90 tablet 1   fluconazole (DIFLUCAN) 100 MG tablet Take 1 tablet (100 mg total) by mouth daily. 3 tablet 0   fluticasone-salmeterol (ADVAIR DISKUS) 500-50 MCG/ACT AEPB Inhale 2 puffs into the lungs in the morning and at bedtime. May decrease to 1 puff in the  morning and at bedtime after a couple of weeks depending on symptoms. 120 each 0   furosemide (LASIX) 20 MG tablet Take 1 tablet (20 mg total) by mouth daily as needed for edema or fluid. (Patient not taking: Reported on 01/22/2023) 30 tablet 0   METHADONE HCL PO Take 120 mg by mouth daily.     montelukast (SINGULAIR) 10 MG tablet Take 1 tablet (10 mg total) by mouth once nightly at bedtime. 90 tablet 1   nystatin (MYCOSTATIN) 100000 UNIT/ML suspension Take 5 mLs by mouth 4 (four) times daily.     spironolactone (ALDACTONE) 25 MG tablet Take 1 tablet (25 mg total) by mouth daily. 90 tablet 1   tamsulosin (FLOMAX) 0.4 MG CAPS capsule Take 2 capsules (0.8 mg total) by mouth daily. (Patient not taking: Reported on 01/22/2023) 60 capsule 0   valsartan (DIOVAN) 80 MG tablet Take 1 tablet (80 mg total) by mouth daily. 30 tablet 3   No current facility-administered medications for this visit.   PHYSICAL EXAM:  General:  Well appearing. No resp difficulty HEENT: normal Neck: supple. JVP flat. No lymphadenopathy or thryomegaly appreciated. Cor: PMI normal. Regular rate & rhythm. No rubs, gallops or murmurs. Lungs: clear Abdomen: soft, nontender, nondistended. No hepatosplenomegaly. No bruits or masses.  Extremities: no cyanosis, clubbing, rash, edema Neuro: alert & oriented x3, cranial nerves grossly intact. Moves all 4  extremities w/o difficulty. Affect pleasant.   ECG: not done   ASSESSMENT & PLAN:  1: Chronic heart failure with preserved ejection fraction- - suspect due to HTN/ substance use - NYHA class II - euvolemic - weighing daily; reminded to call for an overnight weight gain of > 2 pounds or a weekly weight gain of >5 pounds - weight down 2 pounds from last visit here 1 month ago - Echo 09/23/22: EF 55-60% - doesn't add much salt to his food; denies eating out frequently - he stopped carvedilol due to extreme fatigue; symptom improved after stoppage - if HR elevates, can try metoprolol succinate - continue jardiance 10mg  daily  - continue furosemide 20mg  daily PRN; hasn't had to take this in over a month - continue spironolactone 25mg  daily - continue valsartan 80mg  daily - BMP today - BNP 09/22/22 was 125.1  2: HTN- - BP 117/84 - saw PCP Suzie Portela) 08/24 - continue amlodipine 5mg  daily - BMP 01/10/23 showed sodium 138, potassium 5.0, creatinine 1.0 & GFR 97  3: Substance abuse- - follows at methadone clinic 3 days/ week - smokes 5-10 cigarettes daily  4: Moderate persistent asthma- - continue singulair 10mg  daily - continue advair 500/50 BID & make sure rinse/ spit after using this - has nebulizer for PRN usage - he is going to f/u regarding pulm appt  Return in 2 months, sooner if needed.

## 2023-02-24 ENCOUNTER — Other Ambulatory Visit: Payer: Self-pay

## 2023-02-24 ENCOUNTER — Ambulatory Visit (HOSPITAL_BASED_OUTPATIENT_CLINIC_OR_DEPARTMENT_OTHER): Payer: Medicaid Other | Admitting: Family

## 2023-02-24 ENCOUNTER — Encounter: Payer: Self-pay | Admitting: Family

## 2023-02-24 ENCOUNTER — Other Ambulatory Visit
Admission: RE | Admit: 2023-02-24 | Discharge: 2023-02-24 | Disposition: A | Discharge status: Home / Self Care | Payer: Medicaid Other | Source: Ambulatory Visit | Attending: Family | Admitting: Family

## 2023-02-24 VITALS — BP 117/84 | HR 83 | Wt 165.0 lb

## 2023-02-24 DIAGNOSIS — J4489 Other specified chronic obstructive pulmonary disease: Secondary | ICD-10-CM

## 2023-02-24 DIAGNOSIS — I5032 Chronic diastolic (congestive) heart failure: Secondary | ICD-10-CM | POA: Insufficient documentation

## 2023-02-24 DIAGNOSIS — F1911 Other psychoactive substance abuse, in remission: Secondary | ICD-10-CM

## 2023-02-24 DIAGNOSIS — I10 Essential (primary) hypertension: Secondary | ICD-10-CM | POA: Diagnosis not present

## 2023-02-24 LAB — BASIC METABOLIC PANEL
Anion gap: 9 (ref 5–15)
BUN: 9 mg/dL (ref 6–20)
CO2: 32 mmol/L (ref 22–32)
Calcium: 8.8 mg/dL — ABNORMAL LOW (ref 8.9–10.3)
Chloride: 96 mmol/L — ABNORMAL LOW (ref 98–111)
Creatinine, Ser: 0.76 mg/dL (ref 0.61–1.24)
GFR, Estimated: >60 mL/min (ref >60)
Glucose, Bld: 74 mg/dL (ref 70–99)
Potassium: 4.4 mmol/L (ref 3.5–5.1)
Sodium: 137 mmol/L (ref 135–145)

## 2023-02-28 ENCOUNTER — Other Ambulatory Visit: Payer: Medicaid Other

## 2023-02-28 NOTE — Patient Instructions (Signed)
 Tailored Plan Medicaid On July 1, some people on [place] [employer] will move to a new kind of Medicaid health plan called a Tailored Plan. Tailored Plans cover your doctor visits, prescription drugs, and health care services.    If your [place] Medicaid will move to a Tailored Plan, you should have gotten a letter and welcome packet. If you're not sure, call your [place] Medicaid Enrollment Broker at [phone] and ask.  Check out these free materials, in [place] and [place], to learn more about your Tailored Plan: [email or web address]-[email or web address]  Tailored Care Management Services  TCM services are available to you now. If you are a Tailored Plan member or will be and want information about Tailored Care Management Services including rides to appointments and community and home services, call the Care Management provider for your county of residence:    [facility] ([place], [place])  Member Services: [phone] Behavioral Health Crisis Line: 9081880558, [place], [place], [place], [place])  Member Services: [phone] Behavioral Health Crisis Line: [phone]  [facility] Hamper) Member Services: [phone] Behavioral Health Crisis Line: [phone]

## 2023-02-28 NOTE — Patient Outreach (Signed)
Medicaid Managed Care Social Work Note  02/28/2023 Name:  Nathan Vasquez MRN:  [id number] DOB:  30-Sep-1980  Nathan Vasquez is an 42 y.o. year old male who is a primary patient of Jacky Kindle, FNP.  The [facility] Managed Care Coordination team was consulted for assistance with:  Community Resources   Nathan Vasquez was given information about Medicaid Managed Care Coordination team services today. Nathan Vasquez Patient agreed to services and verbal consent obtained.  Engaged with patient  for by telephone forfollow up visit in response to referral for case management and/or care coordination services.   Assessments/Interventions:  Review of past medical history, allergies, medications, health status, including review of consultants reports, laboratory and other test data, was performed as part of comprehensive evaluation and provision of chronic care management services.  SDOH: (Social Determinant of Health) assessments and interventions performed: SDOH Interventions    Flowsheet Row Office Visit from 12/31/2021 in [facility] Treatment Planning from 08/16/2021 in [facility] Treatment Planning from 08/08/2021 in [facility] Office Visit from 07/31/2021 in [facility] from 09/23/2019 in [facility]  SDOH Interventions       Housing Interventions -- -- -- Intervention Not Indicated --  Transportation Interventions -- -- -- Other (Comment)  [relies on mother for transportation] --  Depression Interventions/Treatment  Patient refuses Treatment  [Provider notified] Referral to Psychiatry Currently on Treatment -- Referral to Psychiatry     BSW completed a telephone outreach with patient, he stated he has not paid any attention to his email for the resources BSW sent. Patient states no other resources are needed at this time. BSW provided  patient with contact information for any future needs.  Advanced Directives Status:  Not addressed in this encounter.  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   Follow up:  Patient agrees to Care Plan and Follow-up.  Plan: The  Patient has been provided with contact information for the Managed Medicaid care management team and has been advised to call with any health related questions or concerns.    Abelino Derrick, MHA [facility]  Managed [facility] Social Worker [phone]

## 2023-03-14 ENCOUNTER — Telehealth: Payer: Self-pay

## 2023-03-14 NOTE — Telephone Encounter (Signed)
The patient is scheduled to see Yvonne Kendall, MD on 03-19-23 as a new patient. Called the patient, but was unable to leave a voice message to verify cardiac history due to the patient's voice mailbox not being setup.

## 2023-03-19 ENCOUNTER — Encounter: Payer: Self-pay | Admitting: Internal Medicine

## 2023-03-19 ENCOUNTER — Other Ambulatory Visit: Payer: Self-pay

## 2023-03-19 ENCOUNTER — Ambulatory Visit: Payer: MEDICAID | Attending: Internal Medicine | Admitting: Internal Medicine

## 2023-03-19 VITALS — BP 102/68 | HR 84 | Ht 72.0 in | Wt 170.1 lb

## 2023-03-19 DIAGNOSIS — I10 Essential (primary) hypertension: Secondary | ICD-10-CM | POA: Diagnosis not present

## 2023-03-19 DIAGNOSIS — M542 Cervicalgia: Secondary | ICD-10-CM

## 2023-03-19 DIAGNOSIS — R072 Precordial pain: Secondary | ICD-10-CM | POA: Diagnosis not present

## 2023-03-19 DIAGNOSIS — I5032 Chronic diastolic (congestive) heart failure: Secondary | ICD-10-CM

## 2023-03-19 DIAGNOSIS — F1910 Other psychoactive substance abuse, uncomplicated: Secondary | ICD-10-CM

## 2023-03-19 MED ORDER — METOPROLOL TARTRATE 100 MG PO TABS
ORAL_TABLET | ORAL | 0 refills | Status: DC
  Filled 2023-03-19 – 2023-04-02 (×2): qty 1, 1d supply, fill #0

## 2023-03-19 NOTE — Progress Notes (Signed)
Cardiology Office Note:  .   Date:  03/19/2023  ID:  [name], DOB July 20, 1980, MRN [id number] PCP: Jacky Kindle, FNP  [facility] Providers Cardiologist:  New  History of Present Illness: [name] is a 42 y.o. male with HFpEF diagnosed earlier this year, asthma/COPD, hypertension, hepatitis C, and polysubstance abuse, referred for evaluation of heart failure.  He was hospitalized at [facility] in late April with worsening lower extremity edema, generalized fatigue, and exertional dyspnea.  He was noted to be hypoxic with CT T of the chest showing nodular opacities in the lungs.  He was empirically treated for community-acquired pneumonia and heart failure as BNP was elevated on presentation.  Echocardiogram showed normal LVEF and diastolic parameters.  RV was normal in size and function.  No significant valvular abnormality was identified.  He was subsequently evaluated in the advanced heart failure clinic by Clarisa Kindred, NP, having been seen most recently on 02/24/2023.  He had minimal shortness of breath at that time.  Today, Mr. Wirkkala reports that he has been feeling fairly well with only minimal shortness of breath when climbing stairs, which she attributes to his history of asthma and COPD.  Leading up to his hospitalization in April, he had several weeks of worsening shortness of breath, orthopnea, and lower extremity edema.  This has not recurred.  He noted some associated chest pain when he was particularly short of breath back in April.  He continues to have some intermittent pain along the right side of his neck, which seems to coincide with when he has exertional dyspnea.  He was previously on carvedilol but did not tolerate this due to marked fatigue.  He has also stopped taking furosemide regularly, as he feels like he urinates a lot already and has not had any edema or worsening dyspnea.  He is otherwise compliant with his medications.  ROS: See  HPI  Studies Reviewed: [name]   EKG Interpretation Date/Time:  Wednesday March 19 2023 09:52:04 EDT Ventricular Rate:  84 PR Interval:  146 QRS Duration:  88 QT Interval:  358 QTC Calculation: 423 R Axis:   79  Text Interpretation: Normal sinus rhythm Normal ECG When compared with ECG of 22-Sep-2022 13:35, Criteria for Anteroseptal infarct are no longer Present Confirmed by Aydan Phoenix [phone]) on 03/19/2023 9:56:59 AM    TTE (09/23/2022): Normal LV size and wall thickness.  LVEF 55-6% with normal wall motion and diastolic parameters.  Normal RV size and function.  Normal biatrial size.  No pericardial effusion.  No valvular abnormalities.  CVP 8 mmHg.  Unable to estimate RVSP.  Risk Assessment/Calculations:             Physical Exam:   VS:  BP 102/68 (BP Location: Right Arm, Patient Position: Sitting, Cuff Size: Normal)   Pulse 84   Ht 6' (1.829 m)   Wt 170 lb 2 oz (77.2 kg)   SpO2 96%   BMI 23.07 kg/m    Wt Readings from Last 3 Encounters:  03/19/23 170 lb 2 oz (77.2 kg)  02/24/23 165 lb (74.8 kg)  01/22/23 167 lb 2 oz (75.8 kg)    General:  NAD. Neck: No JVD or HJR. Lungs: Clear to auscultation bilaterally without wheezes or crackles. Heart: Regular rate and rhythm without murmurs, rubs, or gallops. Abdomen: Soft, nontender, nondistended. Extremities: No lower extremity edema.  Normal pedal pulses.  Varicose veins noted bilaterally.  ASSESSMENT AND PLAN: .  Chronic HFpEF: Mr. Bach appears euvolemic on exam today.  His presentation in April with worsening exertional dyspnea, orthopnea, and peripheral edema associated with elevated BNP is certainly concerning for acute heart failure.  However, his echocardiogram at that time did not show any systolic or diastolic abnormalities nor significant valvular heart disease.  Nonetheless, he has done well following initiation of goal-directed medical therapy for HFpEF.  I will continue his current regimen of empagliflozin  and spironolactone.  Valsartan and amlodipine will be continued as well for blood pressure control, though I would have a low threshold for de-escalation given that his blood pressure is low normal today.  In the setting of some intermittent chest and neck discomfort and concern for HFpEF, we will obtain a coronary CTA to exclude ischemic heart disease, particularly given his history of extensive polysubstance abuse in the past.  He should have a follow-up BMP at his next visit with Clarisa Kindred in December to ensure stable renal function and electrolytes in the setting of valsartan and spironolactone use.  Chest and neck pain: Chest pain was present primarily in April when Mr. Hatter was diagnosed with HFpEF.  He has continued to have some exertional right-sided neck pain, which is nonspecific.  However, given his cardiac risk factors (hypertension, male gender, and polysubstance abuse) and HFpEF, we have agreed to obtain a coronary CTA to exclude ischemic heart disease.  Hypertension: Blood pressure very well-controlled today.  Blood pressure drops further or Mr. Erling develops symptoms of hypotension, we may need to consider de-escalation of his regimen.  Polysubstance abuse: I encouraged Mr. Hennesy to quit smoking, which she is working on.  He should continue to remain abstinent from illicit drugs and minimize his alcohol intake.  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    Dispo: Follow-up with Clarisa Kindred, NP, as scheduled in December.  Return to see [place] in 6 months.  Signed, Yvonne Kendall, MD

## 2023-03-19 NOTE — Patient Instructions (Addendum)
Medication Instructions:  Your physician recommends that you continue on your current medications as directed. Please refer to the Current Medication list given to you today.   *If you need a refill on your cardiac medications before your next appointment, please call your pharmacy*   Lab Work: Your provider would like for you to have following labs drawn today (BMP).     Testing/Procedures: Cardiac CT Angiography (CTA), is a special type of CT scan that uses a computer to produce multi-dimensional views of major blood vessels throughout the body. In CT angiography, a contrast material is injected through an IV to help visualize the blood vessels Please see instructions below    Follow-Up: At [facility], you and your health needs are our priority.  As part of our continuing mission to provide you with exceptional heart care, we have created designated Provider Care Teams.  These Care Teams include your primary Cardiologist (physician) and Advanced Practice Providers (APPs -  Physician Assistants and Nurse Practitioners) who all work together to provide you with the care you need, when you need it.  We recommend signing up for the patient portal called "MyChart".  Sign up information is provided on this After Visit Summary.  MyChart is used to connect with patients for Virtual Visits (Telemedicine).  Patients are able to view lab/test results, encounter notes, upcoming appointments, etc.  Non-urgent messages can be sent to your provider as well.   To learn more about what you can do with MyChart, go to [email or web address].    Your next appointment:   6 month(s)  Provider:   You may see Yvonne Kendall, MD or one of the following Advanced Practice Providers on your designated Care Team:   Nicolasa Ducking, NP Eula Listen, PA-C Cadence Fransico Michael, PA-C Charlsie Quest, NP      Your cardiac CT will be scheduled at one of the below locations:   [facility] [address] [place], [place] [address] [phone]  OR   [facility] [address] [place], [place] [address] [phone]  If scheduled at [facility], please arrive at the [facility] and Children's Entrance (Entrance C2) of [facility] 30 minutes prior to test start time. You can use the FREE valet parking offered at entrance C (encouraged to control the heart rate for the test)  Proceed to the [facility] Radiology Department (first floor) to check-in and test prep.  All radiology patients and guests should use entrance C2 at [facility], accessed from [facility], even though the hospital's physical address listed is [address]    If scheduled at [facility] or [facility], please arrive 15 mins early for check-in and test prep.  There is spacious parking and easy access to the radiology department from the [facility] Heart and Vascular entrance. Please enter here and check-in with the desk attendant.   Please follow these instructions carefully (unless otherwise directed):  An IV will be required for this test and Nitroglycerin will be given.    On the Night Before the Test: Be sure to Drink plenty of water. Do not consume any caffeinated/decaffeinated beverages or chocolate 12 hours prior to your test. Do not take any antihistamines 12 hours prior to your test.  On the Day of the Test: Drink plenty of water until 1 hour prior to the test. Do not eat any food 1 hour prior to test.  You may take your regular medications prior to the test.  Take metoprolol (Lopressor) 100 mg two hours prior to test. HOLD Amlodipine the morning of test If you take Spironolactone, please HOLD on the morning of the test. FEMALES- please wear underwire-free bra if available, avoid dresses & tight clothing       After the Test: Drink plenty of  water. After receiving IV contrast, you may experience a mild flushed feeling. This is normal. On occasion, you may experience a mild rash up to 24 hours after the test. This is not dangerous. If this occurs, you can take Benadryl 25 mg and increase your fluid intake. If you experience trouble breathing, this can be serious. If it is severe call 911 IMMEDIATELY. If it is mild, please call our office. If you take any of these medications: Glipizide/Metformin, Avandament, Glucavance, please do not take 48 hours after completing test unless otherwise instructed.  We will call to schedule your test 2-4 weeks out understanding that some insurance companies will need an authorization prior to the service being performed.   For more information and frequently asked questions, please visit our website : [email or web address]  For non-scheduling related questions, please contact the cardiac imaging nurse navigator should you have any questions/concerns: Cardiac Imaging Nurse Navigators Direct Office Dial: [phone]   For scheduling needs, including cancellations and rescheduling, please call [place], [phone].

## 2023-03-20 LAB — BASIC METABOLIC PANEL
BUN/Creatinine Ratio: 11 (ref 9–20)
BUN: 9 mg/dL (ref 6–24)
CO2: 29 mmol/L (ref 20–29)
Calcium: 9.3 mg/dL (ref 8.7–10.2)
Chloride: 95 mmol/L — ABNORMAL LOW (ref 96–106)
Creatinine, Ser: 0.81 mg/dL (ref 0.76–1.27)
Glucose: 65 mg/dL — ABNORMAL LOW (ref 70–99)
Potassium: 4.8 mmol/L (ref 3.5–5.2)
Sodium: 136 mmol/L (ref 134–144)
eGFR: 114 mL/min/{1.73_m2} (ref >59)

## 2023-04-01 ENCOUNTER — Encounter (HOSPITAL_COMMUNITY): Payer: Self-pay

## 2023-04-02 ENCOUNTER — Other Ambulatory Visit: Payer: Self-pay

## 2023-04-02 ENCOUNTER — Telehealth (HOSPITAL_COMMUNITY): Payer: Self-pay | Admitting: *Deleted

## 2023-04-02 NOTE — Telephone Encounter (Signed)
Reaching out to patient to offer assistance regarding upcoming cardiac imaging study; pt verbalizes understanding of appt date/time, parking situation and where to check in, pre-test NPO status and medications ordered, and verified current allergies; name and call back number provided for further questions should they arise Hayley Sharpe RN Navigator Cardiac Imaging [facility] [phone] office [phone] cell  

## 2023-04-03 ENCOUNTER — Ambulatory Visit: Admission: RE | Admit: 2023-04-03 | Payer: MEDICAID | Source: Ambulatory Visit

## 2023-04-03 ENCOUNTER — Other Ambulatory Visit: Payer: Self-pay

## 2023-04-04 ENCOUNTER — Other Ambulatory Visit: Payer: Self-pay

## 2023-04-04 ENCOUNTER — Telehealth: Payer: Self-pay | Admitting: Family Medicine

## 2023-04-04 NOTE — Telephone Encounter (Signed)
Adam calling from [facility] is calling to get clarification on the sig for Rx #: [id number]  fluticasone-salmeterol (ADVAIR DISKUS) 500-50 MCG/ACT AEPB [[id number]]   [facility] Phone: [phone]  Fax: [phone]

## 2023-04-04 NOTE — Telephone Encounter (Signed)
Can you review for [name] please

## 2023-04-11 ENCOUNTER — Telehealth (HOSPITAL_COMMUNITY): Payer: Self-pay | Admitting: *Deleted

## 2023-04-11 NOTE — Telephone Encounter (Signed)
Attempted to call patient regarding upcoming cardiac CT appointment. Unable to leave VM (VM not set up). Johney Frame RN Navigator Cardiac Imaging [facility] [phone] Office

## 2023-04-14 ENCOUNTER — Ambulatory Visit: Admission: RE | Admit: 2023-04-14 | Payer: MEDICAID | Source: Ambulatory Visit

## 2023-04-25 ENCOUNTER — Telehealth (HOSPITAL_COMMUNITY): Payer: Self-pay | Admitting: *Deleted

## 2023-04-25 NOTE — Telephone Encounter (Signed)
Reaching out to patient to offer assistance regarding upcoming cardiac imaging study; pt verbalizes understanding of appt date/time, parking situation and where to check in, pre-test NPO status and medications ordered, and verified current allergies; name and call back number provided for further questions should they arise  Klee Kolek RN Navigator Cardiac Imaging [facility] [phone] office [phone] cell  Patient to take 100mg metoprolol tartrate two hours prior to his cardiac CT scan. 

## 2023-04-28 ENCOUNTER — Encounter: Payer: Medicaid Other | Admitting: Family

## 2023-04-28 ENCOUNTER — Ambulatory Visit
Admission: RE | Admit: 2023-04-28 | Discharge: 2023-04-28 | Disposition: A | Discharge status: Home / Self Care | Payer: MEDICAID | Source: Ambulatory Visit | Attending: Internal Medicine | Admitting: Internal Medicine

## 2023-04-28 DIAGNOSIS — R072 Precordial pain: Secondary | ICD-10-CM | POA: Insufficient documentation

## 2023-04-28 DIAGNOSIS — I5032 Chronic diastolic (congestive) heart failure: Secondary | ICD-10-CM | POA: Insufficient documentation

## 2023-04-28 MED ORDER — NITROGLYCERIN 0.4 MG SL SUBL
0.8000 mg | SUBLINGUAL_TABLET | Freq: Once | SUBLINGUAL | Status: AC
Start: 2023-04-28 — End: 2023-04-28
  Administered 2023-04-28: 0.8 mg via SUBLINGUAL
  Filled 2023-04-28: qty 25

## 2023-04-28 MED ORDER — IOHEXOL 350 MG/ML SOLN
100.0000 mL | Freq: Once | INTRAVENOUS | Status: AC | PRN
  Administered 2023-04-28: 100 mL via INTRAVENOUS

## 2023-04-28 MED ORDER — DILTIAZEM HCL 25 MG/5ML IV SOLN: 10.0000 mg | INTRAVENOUS | Status: DC | PRN

## 2023-04-28 MED ORDER — METOPROLOL TARTRATE 5 MG/5ML IV SOLN: 10.0000 mg | Freq: Once | INTRAVENOUS | Status: DC | PRN

## 2023-04-28 NOTE — Progress Notes (Signed)
Patient tolerated procedure well. Ambulate w/o difficulty. Denies any lightheadedness or being dizzy. Pt denies any pain at this time. Sitting in chair. Pt is encouraged to drink additional water throughout the day and reason explained to patient. Patient verbalized understanding and all questions answered. ABC intact. No further needs at this time. Discharge from procedure area w/o issues. 

## 2023-05-01 ENCOUNTER — Other Ambulatory Visit: Payer: Self-pay

## 2023-05-01 ENCOUNTER — Encounter: Payer: Self-pay | Admitting: Family

## 2023-05-01 ENCOUNTER — Ambulatory Visit: Payer: MEDICAID | Attending: Family | Admitting: Family

## 2023-05-01 VITALS — BP 122/84 | HR 90 | Wt 166.1 lb

## 2023-05-01 DIAGNOSIS — R42 Dizziness and giddiness: Secondary | ICD-10-CM | POA: Diagnosis not present

## 2023-05-01 DIAGNOSIS — J4489 Other specified chronic obstructive pulmonary disease: Secondary | ICD-10-CM | POA: Diagnosis not present

## 2023-05-01 DIAGNOSIS — F17210 Nicotine dependence, cigarettes, uncomplicated: Secondary | ICD-10-CM | POA: Diagnosis not present

## 2023-05-01 DIAGNOSIS — R002 Palpitations: Secondary | ICD-10-CM | POA: Diagnosis not present

## 2023-05-01 DIAGNOSIS — I10 Essential (primary) hypertension: Secondary | ICD-10-CM

## 2023-05-01 DIAGNOSIS — I110 Hypertensive heart disease with heart failure: Secondary | ICD-10-CM | POA: Diagnosis not present

## 2023-05-01 DIAGNOSIS — I5032 Chronic diastolic (congestive) heart failure: Secondary | ICD-10-CM | POA: Diagnosis present

## 2023-05-01 DIAGNOSIS — F419 Anxiety disorder, unspecified: Secondary | ICD-10-CM | POA: Diagnosis not present

## 2023-05-01 DIAGNOSIS — Z79899 Other long term (current) drug therapy: Secondary | ICD-10-CM | POA: Diagnosis not present

## 2023-05-01 DIAGNOSIS — J4540 Moderate persistent asthma, uncomplicated: Secondary | ICD-10-CM | POA: Diagnosis not present

## 2023-05-01 DIAGNOSIS — F319 Bipolar disorder, unspecified: Secondary | ICD-10-CM | POA: Insufficient documentation

## 2023-05-01 DIAGNOSIS — F1910 Other psychoactive substance abuse, uncomplicated: Secondary | ICD-10-CM | POA: Diagnosis not present

## 2023-05-01 MED ORDER — VALSARTAN 80 MG PO TABS
80.0000 mg | ORAL_TABLET | Freq: Every day | ORAL | 3 refills | Status: DC
  Filled 2023-05-01: qty 90, 90d supply, fill #0
  Filled 2023-07-28: qty 90, 90d supply, fill #1
  Filled 2023-10-27: qty 90, 90d supply, fill #2

## 2023-05-01 NOTE — Patient Instructions (Signed)
Medication Changes:  None, continue current medications  Lab Work:  Labs done today, your results will be available in MyChart, we will contact you for abnormal readings.   Special Instructions // Education:  Do the following things EVERYDAY: Weigh yourself in the morning before breakfast. Write it down and keep it in a log. Take your medicines as prescribed Eat low salt foods--Limit salt (sodium) to 2000 mg per day.  Stay as active as you can everyday Limit all fluids for the day to less than 2 liters   Follow-Up in: 7 months (July 2025), **we will call you closer to that time to schedule this appointment    If you have any questions or concerns before your next appointment please send [place] a message through mychart or call our office at [phone] Monday-Friday 8 am-5 pm.   If you have an urgent need after hours on the weekend please call your Primary Cardiologist or the Advanced Heart Failure Clinic in [place] at [phone].   At the Advanced Heart Failure Clinic, you and your health needs are our priority. We have a designated team specialized in the treatment of Heart Failure. This Care Team includes your primary Heart Failure Specialized Cardiologist (physician), Advanced Practice Providers (APPs- Physician Assistants and Nurse Practitioners), and Pharmacist who all work together to provide you with the care you need, when you need it.   You may see any of the following providers on your designated Care Team at your next follow up:  Dr. Arvilla Meres Dr. Marca Ancona Dr. Dorthula Nettles Dr. Theresia Bough Tonye Becket, NP Robbie Lis, [place] [address] [place], [place] Brynda Peon, NP [place] Lee, NP Clarisa Kindred, NP Enos Fling, PharmD

## 2023-05-01 NOTE — Progress Notes (Signed)
Advanced Heart Failure Clinic Note    PCP: Jacky Kindle, FNP (last seen 08/24) Cardiologist: Yvonne Kendall, MD (last seen 10/24; returns 04/25)  HPI:  Nathan Vasquez is a 42 y/o male with a history of HTN, bipolar disorder, moderate persistent asthma, COPD, hepatitis C with SVR, opiod use disorder, anxiety, tobacco use and chronic heart failure. Broke his left humerus 02/24 after a fall.   Was in the ED 07/25/22 after falling. Xray confirmed left humeral neck fracture. Placed arm in sling with orthpaedic f/u.   Admitted 09/22/22 due to lower extremity swelling, generalized fatigue, dyspnea on exertion. Currently follows at outpatient methadone clinic but recently use fentanyl. Upon arrival noted to be hypoxic requiring 6 L nasal cannula, CTA chest was negative for PE but showed bilateral nodular opacity. Patient was started on azithromycin, Rocephin and diuretics. BNP minimally elevated. Procalcitonin is negative. COVID-negative.  Full respiratory panel-Neg   Echo 09/23/22: EF 55-60%  Cardiac CTA 04/28/23: IMPRESSION: 1. Coronary calcium score of 0.  2. Normal coronary origin with right dominance.  3. No evidence of CAD.  4. CAD-RADS 0. Consider non-atherosclerotic causes of chest pain.  He presents today for a HF f/u visit with a chief complaint of minimal shortness of breath with moderate exertion (improving). Has associated fatigue, occasional palpitations with going up steps and occasional dizziness along with this. Denies chest pain, cough, abdominal distention, pedal edema or weight gain. He mentions that he has a decreased appetite although does eat "all the time" but can't gain any weight.   Hasn't taken any diuretic (lasix/ spironolactone) in several months.   Currently lives with his mom and his bedroom is upstairs. Says that he's able to walk up the stairs now with less SOB than previously. Does occasionally notice palpitations when going up the steps.   Smokes 5-10  cigarettes and is trying to quit. Denies alcohol use.   ROS: All systems negative except as listed in HPI, PMH and Problem List.  SH:  Social History   Socioeconomic History   Marital status: Single    Spouse name: Not on file   Number of children: 1   Years of education: 6th grade   Highest education level: 6th grade  Occupational History   Occupation: NA  Tobacco Use   Smoking status: Every Day    Current packs/day: 0.50    Average packs/day: 0.5 packs/day for 26.0 years (13.0 ttl pk-yrs)    Types: Cigarettes   Smokeless tobacco: Current  Vaping Use   Vaping status: Former   Quit date: 12/21/2019  Substance and Sexual Activity   Alcohol use: Not Currently    Comment: seldom, once every 3-6 months   Drug use: Not Currently    Frequency: 7.0 times per week    Types: Heroin, Marijuana, Cocaine    Comment: last use 11/26/21 heroin.   Sexual activity: Yes    Partners: Female  Other Topics Concern   Not on file  Social History Narrative   Not on file   Social Determinants of Health   Financial Resource Strain: High Risk (04/10/2022)   Overall Financial Resource Strain (CARDIA)    Difficulty of Paying Living Expenses: Very hard  Food Insecurity: Food Insecurity Present (01/29/2023)   Hunger Vital Sign    Worried About Running Out of Food in the Last Year: Sometimes true    Ran Out of Food in the Last Year: Sometimes true  Transportation Needs: No Transportation Needs (01/29/2023)   PRAPARE -  Administrator, Civil Service (Medical): No    Lack of Transportation (Non-Medical): No  Physical Activity: Inactive (04/10/2022)   Exercise Vital Sign    Days of Exercise per Week: 0 days    Minutes of Exercise per Session: 0 min  Stress: Stress Concern Present (04/10/2022)   [employer] of Occupational Health - Occupational Stress Questionnaire    Feeling of Stress : Very much  Social Connections: Socially Isolated (04/10/2022)   Social Connection and Isolation  Panel [NHANES]    Frequency of Communication with Friends and Family: More than three times a week    Frequency of Social Gatherings with Friends and Family: Not on file    Attends Religious Services: Never    Active Member of Clubs or Organizations: No    Attends Banker Meetings: Never    Marital Status: Never married  Intimate Partner Violence: Not At Risk (04/10/2022)   Humiliation, Afraid, Rape, and Kick questionnaire    Fear of Current or Ex-Partner: No    Emotionally Abused: No    Physically Abused: No    Sexually Abused: No    FH:  Family History  Problem Relation Age of Onset   COPD Mother    Diabetes Mother    Heart disease Mother    Anxiety disorder Mother    Stroke Father 63   Schizophrenia Father    Diabetes Maternal Grandmother    Diabetes Maternal Grandfather    Cancer Maternal Grandfather    Diabetes Paternal Grandmother    Diabetes Paternal Grandfather    Heart attack Paternal Grandfather     Past Medical History:  Diagnosis Date   Asthma    COPD (chronic obstructive pulmonary disease) (HCC)    Heart failure (HCC)    Hep C w/o coma, chronic (HCC)    Heroin abuse (HCC)    History of balanitis    Hypertension     Current Outpatient Medications  Medication Sig Dispense Refill   albuterol (PROVENTIL) (2.5 MG/3ML) 0.083% nebulizer solution Take 2.5 mg by nebulization every 6 (six) hours as needed for wheezing or shortness of breath.     albuterol (VENTOLIN HFA) 108 (90 Base) MCG/ACT inhaler Inhale 1-2 puffs into the lungs every 6 (six) hours as needed for wheezing or shortness of breath.     amLODipine (NORVASC) 5 MG tablet Take 1 tablet (5 mg total) by mouth daily. 90 tablet 1   DULoxetine (CYMBALTA) 30 MG capsule Take 1 capsule (30 mg total) by mouth 2 (two) times daily. 180 capsule 1   empagliflozin (JARDIANCE) 10 MG TABS tablet Take 1 tablet (10 mg total) by mouth daily before breakfast. 90 tablet 1   fluticasone-salmeterol (ADVAIR  DISKUS) 500-50 MCG/ACT AEPB Inhale 2 puffs into the lungs in the morning and at bedtime. May decrease to 1 puff in the morning and at bedtime after a couple of weeks depending on symptoms. 120 each 0   furosemide (LASIX) 20 MG tablet Take 1 tablet (20 mg total) by mouth daily as needed for edema or fluid. (Patient not taking: Reported on 03/19/2023) 30 tablet 0   METHADONE HCL PO Take 120 mg by mouth daily.     metoprolol tartrate (LOPRESSOR) 100 MG tablet TAKE 1 TABLET BY MOUTH 2 HOURS PRIOR TO CARDIAC PROCEDURE 1 tablet 0   montelukast (SINGULAIR) 10 MG tablet Take 1 tablet (10 mg total) by mouth once nightly at bedtime. 90 tablet 1   nystatin (MYCOSTATIN) 100000 UNIT/ML suspension Take  5 mLs by mouth 4 (four) times daily. (Patient not taking: Reported on 03/19/2023)     spironolactone (ALDACTONE) 25 MG tablet Take 1 tablet (25 mg total) by mouth daily. 90 tablet 1   valsartan (DIOVAN) 80 MG tablet Take 1 tablet (80 mg total) by mouth daily. 30 tablet 3   No current facility-administered medications for this visit.   Vitals:   05/01/23 1147  BP: 122/84  Pulse: 90  SpO2: 96%  Weight: 166 lb 2 oz (75.4 kg)   Wt Readings from Last 3 Encounters:  05/01/23 166 lb 2 oz (75.4 kg)  03/19/23 170 lb 2 oz (77.2 kg)  02/24/23 165 lb (74.8 kg)   Lab Results  Component Value Date   CREATININE 0.81 03/19/2023   CREATININE 0.76 02/24/2023   CREATININE 1.00 01/10/2023    PHYSICAL EXAM:  General:  Well appearing. No resp difficulty HEENT: normal Neck: supple. JVP flat. No lymphadenopathy or thryomegaly appreciated. Cor: PMI normal. Regular rate & rhythm. No rubs, gallops or murmurs. Lungs: clear Abdomen: soft, nontender, nondistended. No hepatosplenomegaly. No bruits or masses.  Extremities: no cyanosis, clubbing, rash, edema Neuro: alert & oriented x3, cranial nerves grossly intact. Moves all 4 extremities w/o difficulty. Affect pleasant.   ECG: not done   ASSESSMENT & PLAN:  1:  Chronic heart failure with preserved ejection fraction- - suspect due to HTN/ substance use - NYHA class II - euvolemic - weighing daily; reminded to call for an overnight weight gain of > 2 pounds or a weekly weight gain of >5 pounds - weight stable from last visit here 2 months ago - Echo 09/23/22: EF 55-60% - Cardiac CTA 04/28/23:   1. Coronary calcium score of 0.    2. Normal coronary origin with right dominance.    3. No evidence of CAD.    4. CAD-RADS 0. Consider non-atherosclerotic causes of chest pain - doesn't add much salt to his food; denies eating out frequently - saw cardiology (End) 10/24 - continue jardiance 10mg  daily  - continue furosemide 20mg  daily PRN - continue valsartan 80mg  daily - hasn't had to take furosemide/ spironolactone in several months - BMET today - BNP 09/22/22 was 125.1  2: HTN- - BP 122/84 - saw PCP Suzie Portela) 08/24 - continue amlodipine 5mg  daily - BMP 03/19/23 showed sodium 136, potassium 4.8, creatinine 0.81 & GFR 114 - BMET today  3: Substance abuse- - follows at methadone clinic 3 days/ week - smokes 5-10 cigarettes daily  4: Moderate persistent asthma- - continue singulair 10mg  daily - continue advair 500/50 BID & make sure rinse/ spit after using this - has nebulizer for PRN usage - he is going to f/u regarding pulm appt   Return in 7 months to land in between general cardiology appointments. Can return sooner if needed.

## 2023-05-07 ENCOUNTER — Telehealth: Payer: Self-pay | Admitting: Family Medicine

## 2023-05-07 ENCOUNTER — Other Ambulatory Visit: Payer: Self-pay

## 2023-05-07 ENCOUNTER — Other Ambulatory Visit: Payer: Self-pay | Admitting: Family

## 2023-05-07 ENCOUNTER — Encounter: Payer: Self-pay | Admitting: Family

## 2023-05-07 MED ORDER — SPIRONOLACTONE 25 MG PO TABS
25.0000 mg | ORAL_TABLET | Freq: Every day | ORAL | 3 refills | Status: DC
  Filled 2023-05-07: qty 90, 90d supply, fill #0
  Filled 2023-07-28: qty 90, 90d supply, fill #1
  Filled 2023-10-27: qty 90, 90d supply, fill #2
  Filled 2024-01-20: qty 90, 90d supply, fill #3

## 2023-05-07 NOTE — Telephone Encounter (Signed)
Received a fax from covermymeds for Jardiance 10mg   Key: [id number]

## 2023-05-08 ENCOUNTER — Other Ambulatory Visit: Payer: Self-pay

## 2023-05-09 NOTE — Telephone Encounter (Signed)
Received a fax from covermymeds for Jardiance 10mg  is waiting   Key: [id number]

## 2023-05-12 NOTE — Telephone Encounter (Signed)
PA sent to [employer]

## 2023-05-13 ENCOUNTER — Other Ambulatory Visit: Payer: Self-pay

## 2023-05-27 ENCOUNTER — Other Ambulatory Visit: Payer: Self-pay

## 2023-05-30 ENCOUNTER — Other Ambulatory Visit: Payer: Self-pay

## 2023-05-30 ENCOUNTER — Ambulatory Visit (INDEPENDENT_AMBULATORY_CARE_PROVIDER_SITE_OTHER): Payer: MEDICAID | Admitting: Family Medicine

## 2023-05-30 VITALS — BP 126/77 | HR 82 | Ht 72.0 in | Wt 172.0 lb

## 2023-05-30 DIAGNOSIS — I5022 Chronic systolic (congestive) heart failure: Secondary | ICD-10-CM

## 2023-05-30 DIAGNOSIS — J4489 Other specified chronic obstructive pulmonary disease: Secondary | ICD-10-CM | POA: Diagnosis not present

## 2023-05-30 DIAGNOSIS — F39 Unspecified mood [affective] disorder: Secondary | ICD-10-CM

## 2023-05-30 DIAGNOSIS — F1911 Other psychoactive substance abuse, in remission: Secondary | ICD-10-CM

## 2023-05-30 DIAGNOSIS — F17210 Nicotine dependence, cigarettes, uncomplicated: Secondary | ICD-10-CM

## 2023-05-30 DIAGNOSIS — Z139 Encounter for screening, unspecified: Secondary | ICD-10-CM

## 2023-05-30 DIAGNOSIS — S42202S Unspecified fracture of upper end of left humerus, sequela: Secondary | ICD-10-CM | POA: Diagnosis not present

## 2023-05-30 DIAGNOSIS — I10 Essential (primary) hypertension: Secondary | ICD-10-CM

## 2023-05-30 MED ORDER — FLUTICASONE-SALMETEROL 500-50 MCG/ACT IN AEPB
1.0000 | INHALATION_SPRAY | Freq: Two times a day (BID) | RESPIRATORY_TRACT | 11 refills | Status: DC
  Filled 2023-05-30 – 2023-06-30 (×3): qty 60, 30d supply, fill #0
  Filled 2023-07-28: qty 60, 30d supply, fill #1
  Filled 2023-08-24: qty 60, 30d supply, fill #2
  Filled 2023-09-29: qty 60, 30d supply, fill #3
  Filled 2023-10-27: qty 60, 30d supply, fill #4
  Filled 2023-11-25: qty 60, 30d supply, fill #5
  Filled 2023-12-22: qty 60, 30d supply, fill #6
  Filled 2024-01-20: qty 60, 30d supply, fill #7

## 2023-05-30 MED ORDER — EMPAGLIFLOZIN 10 MG PO TABS
10.0000 mg | ORAL_TABLET | Freq: Every day | ORAL | 4 refills | Status: AC
  Filled 2023-05-30: qty 90, 90d supply, fill #0
  Filled 2023-07-28 – 2023-08-01 (×2): qty 90, 90d supply, fill #1
  Filled 2023-10-27: qty 90, 90d supply, fill #2
  Filled 2024-01-20: qty 90, 90d supply, fill #3
  Filled 2024-05-21: qty 90, 90d supply, fill #4

## 2023-05-30 MED ORDER — AMLODIPINE BESYLATE 5 MG PO TABS
5.0000 mg | ORAL_TABLET | Freq: Every day | ORAL | 4 refills | Status: AC
  Filled 2023-05-30 – 2023-07-28 (×2): qty 90, 90d supply, fill #0
  Filled 2023-10-27: qty 90, 90d supply, fill #1
  Filled 2024-01-20: qty 90, 90d supply, fill #2
  Filled 2024-05-21: qty 90, 90d supply, fill #3

## 2023-05-30 NOTE — Assessment & Plan Note (Signed)
 Denies further assistance at this time; continues to note financial concerns and impaired living situation with multigenerational household CTM

## 2023-05-30 NOTE — Assessment & Plan Note (Signed)
 Chronic, improved Pt working on tobacco reduction with goal of cessation efforts

## 2023-05-30 NOTE — Assessment & Plan Note (Signed)
 Chronic, improved Continue to monitor use with goal of cessation efforts

## 2023-05-30 NOTE — Assessment & Plan Note (Signed)
 Hx of heroin use; has recently increased his methadone dosing CTM Congratulated on continued sobriety

## 2023-05-30 NOTE — Assessment & Plan Note (Signed)
 Chronic, improved; failed BB start d/t fatigue Borderline elevation today; chronic tobacco use remains Goal remains <119/<79 CTM

## 2023-05-30 NOTE — Assessment & Plan Note (Signed)
 Chronic, improved Remains on cymbalta 30 mg BID to assist Denies SI or HI concerns

## 2023-05-30 NOTE — Assessment & Plan Note (Signed)
 Patient with ongoing pain complaint of L arm s/p fall with fracture Historical event; request for PT referral.

## 2023-05-30 NOTE — Assessment & Plan Note (Signed)
 Chronic, improved Followed by [facility] heart failure program with Clarisa Kindred NP

## 2023-05-30 NOTE — Progress Notes (Signed)
 Established patient visit  Patient: Nathan Vasquez   DOB: June 26, 1980   43 y.o. Male  MRN: [id number] Visit Date: 05/30/2023  Today's healthcare provider: Kelly ONEIDA Cedar, FNP  Introduced to nurse practitioner role and practice setting.  All questions answered.  Discussed provider/patient relationship and expectations.  Subjective    HPI HPI   Follow-up Last edited by Deitra Therisa HERO, CMA on 05/30/2023  1:10 PM.      Medications: Outpatient Medications Prior to Visit  Medication Sig   albuterol  (PROVENTIL ) (2.5 MG/3ML) 0.083% nebulizer solution Take 2.5 mg by nebulization every 6 (six) hours as needed for wheezing or shortness of breath.   albuterol  (VENTOLIN  HFA) 108 (90 Base) MCG/ACT inhaler Inhale 1-2 puffs into the lungs every 6 (six) hours as needed for wheezing or shortness of breath.   DULoxetine  (CYMBALTA ) 30 MG capsule Take 1 capsule (30 mg total) by mouth 2 (two) times daily.   furosemide  (LASIX ) 20 MG tablet Take 1 tablet (20 mg total) by mouth daily as needed for edema or fluid.   METHADONE  HCL PO Take 120 mg by mouth daily.   montelukast  (SINGULAIR ) 10 MG tablet Take 1 tablet (10 mg total) by mouth once nightly at bedtime.   nystatin  (MYCOSTATIN ) 100000 UNIT/ML suspension Take 5 mLs by mouth 4 (four) times daily.   spironolactone  (ALDACTONE ) 25 MG tablet Take 1 tablet (25 mg total) by mouth daily.   valsartan  (DIOVAN ) 80 MG tablet Take 1 tablet (80 mg total) by mouth daily.   [DISCONTINUED] amLODipine  (NORVASC ) 5 MG tablet Take 1 tablet (5 mg total) by mouth daily.   [DISCONTINUED] empagliflozin  (JARDIANCE ) 10 MG TABS tablet Take 1 tablet (10 mg total) by mouth daily before breakfast.   [DISCONTINUED] fluticasone -salmeterol (ADVAIR  DISKUS) 500-50 MCG/ACT AEPB Inhale 2 puffs into the lungs in the morning and at bedtime. May decrease to 1 puff in the morning and at bedtime after a couple of weeks depending on symptoms.   No facility-administered medications prior to  visit.   Last CBC Lab Results  Component Value Date   WBC 4.6 01/10/2023   HGB 16.1 01/10/2023   HCT 49.9 01/10/2023   MCV 88 01/10/2023   MCH 28.5 01/10/2023   RDW 13.8 01/10/2023   PLT 171 01/10/2023   Last metabolic panel Lab Results  Component Value Date   GLUCOSE 65 (L) 03/19/2023   NA 136 03/19/2023   K 4.8 03/19/2023   CL 95 (L) 03/19/2023   CO2 29 03/19/2023   BUN 9 03/19/2023   CREATININE 0.81 03/19/2023   EGFR 114 03/19/2023   CALCIUM  9.3 03/19/2023   PROT 7.2 01/10/2023   ALBUMIN 4.7 01/10/2023   LABGLOB 2.5 01/10/2023   AGRATIO 1.8 07/31/2021   BILITOT 0.4 01/10/2023   ALKPHOS 112 01/10/2023   AST 16 01/10/2023   ALT 15 01/10/2023   ANIONGAP 9 02/24/2023   Last lipids Lab Results  Component Value Date   CHOL 162 01/10/2023   HDL 61 01/10/2023   LDLCALC 86 01/10/2023   TRIG 77 01/10/2023   CHOLHDL 2.7 01/10/2023   Last hemoglobin A1c Lab Results  Component Value Date   HGBA1C 5.0 01/10/2023   Last thyroid functions Lab Results  Component Value Date   TSH 1.330 01/10/2023   Last vitamin D Lab Results  Component Value Date   VD25OH 45.0 07/31/2021      Objective    BP 126/77 (BP Location: Left Arm, Patient Position: Sitting, Cuff  Size: Normal)   Pulse 82   Ht 6' (1.829 m)   Wt 172 lb (78 kg)   SpO2 96%   BMI 23.33 kg/m   BP Readings from Last 3 Encounters:  05/30/23 126/77  05/01/23 122/84  04/28/23 106/65   Wt Readings from Last 3 Encounters:  05/30/23 172 lb (78 kg)  05/01/23 166 lb 2 oz (75.4 kg)  03/19/23 170 lb 2 oz (77.2 kg)   SpO2 Readings from Last 3 Encounters:  05/30/23 96%  05/01/23 96%  03/19/23 96%   Physical Exam Vitals and nursing note reviewed.  Constitutional:      Appearance: Normal appearance. He is normal weight.  HENT:     Head: Normocephalic and atraumatic.  Cardiovascular:     Rate and Rhythm: Normal rate and regular rhythm.     Pulses: Normal pulses.     Heart sounds: Normal heart sounds.   Pulmonary:     Effort: Pulmonary effort is normal.     Breath sounds: Normal breath sounds.  Musculoskeletal:        General: Tenderness, deformity and signs of injury present. Normal range of motion.     Cervical back: Normal range of motion.     Comments: Continued ROM deficit to L shoulder; reports breathing and CHF are improved where he feels he can complete PT to assist; request for referral  Skin:    General: Skin is warm and dry.     Capillary Refill: Capillary refill takes less than 2 seconds.  Neurological:     General: No focal deficit present.     Mental Status: He is alert and oriented to person, place, and time. Mental status is at baseline.  Psychiatric:        Mood and Affect: Mood normal.        Behavior: Behavior normal.        Thought Content: Thought content normal.        Judgment: Judgment normal.     No results found for any visits on 05/30/23.  Assessment & Plan     Problem List Items Addressed This Visit       Cardiovascular and Mediastinum   Essential hypertension   Chronic, improved; failed BB start d/t fatigue Borderline elevation today; chronic tobacco use remains Goal remains <119/<79 CTM       Relevant Medications   amLODipine  (NORVASC ) 5 MG tablet   empagliflozin  (JARDIANCE ) 10 MG TABS tablet   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   Chronic, improved Followed by [facility] heart failure program with Ellouise Class NP      Relevant Medications   amLODipine  (NORVASC ) 5 MG tablet   empagliflozin  (JARDIANCE ) 10 MG TABS tablet     Respiratory   Asthma with chronic obstructive pulmonary disease (COPD) (HCC)   Chronic, improved Pt working on tobacco reduction with goal of cessation efforts       Relevant Medications   fluticasone -salmeterol (ADVAIR  DISKUS) 500-50 MCG/ACT AEPB     Musculoskeletal and Integument   Closed fracture of proximal end of left humerus   Patient with ongoing pain complaint of L  arm s/p fall with fracture Historical event; request for PT referral.      Relevant Orders   Ambulatory referral to Physical Therapy     Other   Encounter for screening involving social determinants of health [facility])   Denies further assistance at this time; continues to note financial concerns and impaired living situation with  multigenerational household CTM      History of substance abuse (HCC)   Hx of heroin use; has recently increased his methadone  dosing CTM Congratulated on continued sobriety       Mood disorder (HCC)   Chronic, improved Remains on cymbalta  30 mg BID to assist Denies SI or HI concerns      Tobacco dependence due to cigarettes   Chronic, improved Continue to monitor use with goal of cessation efforts       No follow-ups on file.     LILLETTE Kelly ONEIDA Emilio, FNP, have reviewed all documentation for this visit. The documentation on 05/30/23 for the exam, diagnosis, procedures, and orders are all accurate and complete.  Kelly ONEIDA Emilio, FNP  [facility] [phone] (phone) [phone] (fax)  [facility]

## 2023-05-31 ENCOUNTER — Other Ambulatory Visit: Payer: Self-pay

## 2023-06-02 ENCOUNTER — Other Ambulatory Visit: Payer: Self-pay

## 2023-06-13 ENCOUNTER — Other Ambulatory Visit: Payer: Self-pay

## 2023-06-30 ENCOUNTER — Other Ambulatory Visit: Payer: Self-pay

## 2023-07-04 ENCOUNTER — Telehealth: Payer: Self-pay | Admitting: Internal Medicine

## 2023-07-04 NOTE — Telephone Encounter (Signed)
 Preoperative team, we are not able to provide blanket coverage.  Please contact requesting office and ask for specific details surrounding oral care.  Once we are provided with details we will be able to offer recommendations from a cardiac standpoint.  Thank you for your help.  Josefa HERO. Sarae Nicholes NP-C     07/04/2023, 2:50 PM [facility] [address] Office [phone] Fax [phone]

## 2023-07-04 NOTE — Telephone Encounter (Signed)
   Pre-operative Risk Assessment    Patient Name: Nathan Vasquez  DOB: 04/09/1981 MRN: [id number]   Date of last office visit: 03/19/23 Date of next office visit: n/a  Request for Surgical Clearance    Procedure:  Extraction & fillings  Date of Surgery:  Clearance TBD                                Surgeon:  not listed Surgeon's Group or Practice Name:  [facility] Phone number:  [phone] Fax number:  [phone]   Type of Clearance Requested:   - Medical , Congestive Heart Failure   Type of Anesthesia:  Not Indicated   Additional requests/questions:    Signed, Arsenio Celine GAILS   07/04/2023, 9:42 AM

## 2023-07-07 NOTE — Telephone Encounter (Signed)
   Primary Cardiologist: Sammy Crisp, MD  Chart reviewed as part of pre-operative protocol coverage. Simple dental extractions (1-2 teeth) are considered low risk procedures per guidelines and generally do not require any specific cardiac clearance. It is also generally accepted that for simple extractions and dental cleanings, there is no need to interrupt blood thinner therapy.   SBE prophylaxis is not required for the patient.  I will route this recommendation to the requesting party via Epic fax function and remove from pre-op pool.  Please call with questions.  Gerldine Koch, NP-C  07/07/2023, 12:34 PM [address] Office [phone] Fax [phone]

## 2023-07-07 NOTE — Telephone Encounter (Signed)
 Primary cardiologist is Dr. Nolan Battle.   ADDENDUM:   PROCEDURE: 1 TOOTH SURGICALLY EXTRACTED AND SEVERAL FILLINGS  ANESTHESIA: LOCAL

## 2023-07-25 ENCOUNTER — Encounter: Payer: Self-pay | Admitting: Nurse Practitioner

## 2023-07-25 ENCOUNTER — Ambulatory Visit: Payer: MEDICAID | Attending: Nurse Practitioner | Admitting: Nurse Practitioner

## 2023-07-25 VITALS — BP 104/70 | HR 78 | Ht 72.0 in | Wt 165.0 lb

## 2023-07-25 DIAGNOSIS — R072 Precordial pain: Secondary | ICD-10-CM | POA: Diagnosis not present

## 2023-07-25 DIAGNOSIS — F1910 Other psychoactive substance abuse, uncomplicated: Secondary | ICD-10-CM

## 2023-07-25 DIAGNOSIS — I5032 Chronic diastolic (congestive) heart failure: Secondary | ICD-10-CM

## 2023-07-25 DIAGNOSIS — I10 Essential (primary) hypertension: Secondary | ICD-10-CM | POA: Diagnosis not present

## 2023-07-25 NOTE — Patient Instructions (Signed)
 Medication Instructions:  No changes *If you need a refill on your cardiac medications before your next appointment, please call your pharmacy*   Lab Work: Your provider would like for you to have the following labs today: BMET  If you have labs (blood work) drawn today and your tests are completely normal, you will receive your results only by: MyChart Message (if you have MyChart) OR A paper copy in the mail If you have any lab test that is abnormal or we need to change your treatment, we will call you to review the results.   Testing/Procedures: None ordered   Follow-Up: At [facility], you and your health needs are our priority.  As part of our continuing mission to provide you with exceptional heart care, we have created designated Provider Care Teams.  These Care Teams include your primary Cardiologist (physician) and Advanced Practice Providers (APPs -  Physician Assistants and Nurse Practitioners) who all work together to provide you with the care you need, when you need it.  We recommend signing up for the patient portal called "MyChart".  Sign up information is provided on this After Visit Summary.  MyChart is used to connect with patients for Virtual Visits (Telemedicine).  Patients are able to view lab/test results, encounter notes, upcoming appointments, etc.  Non-urgent messages can be sent to your provider as well.   To learn more about what you can do with MyChart, go to [email or web address].    Your next appointment:   6 month(s)  Provider:   Yvonne Kendall, MD

## 2023-07-25 NOTE — Progress Notes (Signed)
 Office Visit    Patient Name: Nathan Vasquez Date of Encounter: 07/25/2023  Primary Care Provider:  Debera Lat, PA-C Primary Cardiologist:  Nathan Kendall, MD  Chief Complaint    43 y.o. male with a history of chronic HFpEF, asthma, COPD, hypertension, hepatitis C, and polysubstance abuse, who presents for heart failure follow-up.  Past Medical History  Subjective   Past Medical History:  Diagnosis Date   (HFpEF) heart failure with preserved ejection fraction (HCC)    a. 08/2022 Echo: EF 55-60%, no rwma, nl RV fxn.   Asthma    Chest pain    a. 04/2023 Cor CTA: nl cors. Ca2+= 0.   COPD (chronic obstructive pulmonary disease) (HCC)    Gynecomastia    a. Noted on CT 04/2023 - in setting of spironolactone rx.   Hep C w/o coma, chronic (HCC)    Heroin abuse (HCC)    a. OD x 9.   History of balanitis    Hypertension    Past Surgical History:  Procedure Laterality Date   TOOTH EXTRACTION      Allergies  Allergies  Allergen Reactions   Carvedilol     Extreme fatigue   Penicillins Rash      History of Present Illness      43 y.o. y/o male with the above past medical history including chronic HFpEF, asthma, COPD, hypertension, hepatitis C, and polysubstance abuse.  In April 2024, he was hospitalized at [facility] with worsening lower extremity edema, fatigue, and dyspnea.  He was noted to be hypoxic.  He was treated for community-acquired pneumonia and heart failure as BNP was elevated.  Echo showed normal LV and RV function without significant valvular abnormalities.  At office follow-up in October 2024, he complained of intermittent chest and neck discomfort.  Coronary CT angiography was performed in December 2024 and showed normal coronary arteries with a calcium score of 0.  Bilateral gynecomastia was noted on radiology over read.  Since his last visit, Nathan Vasquez notes that he has chronic dyspnea on exertion, which is unchanged.  This typically occurs at  higher levels of activity.  He does not experience chest pain.  He continues to smoke marijuana and snort heroin on a daily basis.  He also smokes half a pack cigarettes daily.  He had been receiving methadone however, due to issues with his dose, he stopped going and has just been using heroin instead.  He does not have plans at this time to stop using.  He denies palpitations, PND, orthopnea, dizziness, syncope, edema, or early satiety.    Objective  Home Medications    Current Outpatient Medications  Medication Sig Dispense Refill   albuterol (PROVENTIL) (2.5 MG/3ML) 0.083% nebulizer solution Take 2.5 mg by nebulization every 6 (six) hours as needed for wheezing or shortness of breath.     albuterol (VENTOLIN HFA) 108 (90 Base) MCG/ACT inhaler Inhale 1-2 puffs into the lungs every 6 (six) hours as needed for wheezing or shortness of breath.     amLODipine (NORVASC) 5 MG tablet Take 1 tablet (5 mg total) by mouth daily. 90 tablet 4   empagliflozin (JARDIANCE) 10 MG TABS tablet Take 1 tablet (10 mg total) by mouth daily before breakfast. 90 tablet 4   fluticasone-salmeterol (ADVAIR DISKUS) 500-50 MCG/ACT AEPB Inhale 1 puff into the lungs in the morning and at bedtime. 60 each 11   furosemide (LASIX) 20 MG tablet Take 1 tablet (20 mg total) by mouth daily as  needed for edema or fluid. 30 tablet 0   METHADONE HCL PO Take 120 mg by mouth daily.     montelukast (SINGULAIR) 10 MG tablet Take 1 tablet (10 mg total) by mouth once nightly at bedtime. 90 tablet 1   nystatin (MYCOSTATIN) 100000 UNIT/ML suspension Take 5 mLs by mouth 4 (four) times daily.     spironolactone (ALDACTONE) 25 MG tablet Take 1 tablet (25 mg total) by mouth daily. 90 tablet 3   valsartan (DIOVAN) 80 MG tablet Take 1 tablet (80 mg total) by mouth daily. 90 tablet 3   No current facility-administered medications for this visit.     Physical Exam    VS:  BP 104/70 (BP Location: Right Arm, Patient Position: Sitting, Cuff Size:  Normal)   Pulse 78   Ht 6' (1.829 m)   Wt 165 lb (74.8 kg)   SpO2 93%   BMI 22.38 kg/m  , BMI Body mass index is 22.38 kg/m.       GEN: Well nourished, well developed, in no acute distress. HEENT: normal. Neck: Supple, no JVD, carotid bruits, or masses. Cardiac: RRR, no murmurs, rubs, or gallops. No clubbing, cyanosis, edema.  Radials 2+/PT 2+ and equal bilaterally.  Respiratory:  Respirations regular and unlabored, clear to auscultation bilaterally. GI: Soft, nontender, nondistended, BS + x 4. MS: no deformity or atrophy. Skin: warm and dry, no rash. Neuro:  Strength and sensation are intact. Psych: Normal affect.  Accessory Clinical Findings    ECG personally reviewed by me today - EKG Interpretation Date/Time:  Friday July 25 2023 15:13:33 EST Ventricular Rate:  78 PR Interval:  144 QRS Duration:  90 QT Interval:  378 QTC Calculation: 430 R Axis:   89  Text Interpretation: Normal sinus rhythm Normal ECG Confirmed by Nicolasa Ducking [phone]) on 07/25/2023 3:30:34 PM  - no acute changes.  Lab Results  Component Value Date   WBC 4.6 01/10/2023   HGB 16.1 01/10/2023   HCT 49.9 01/10/2023   MCV 88 01/10/2023   PLT 171 01/10/2023   Lab Results  Component Value Date   CREATININE 0.81 03/19/2023   BUN 9 03/19/2023   NA 136 03/19/2023   K 4.8 03/19/2023   CL 95 (L) 03/19/2023   CO2 29 03/19/2023   Lab Results  Component Value Date   ALT 15 01/10/2023   AST 16 01/10/2023   GGT 108 (H) 06/28/2021   ALKPHOS 112 01/10/2023   BILITOT 0.4 01/10/2023   Lab Results  Component Value Date   CHOL 162 01/10/2023   HDL 61 01/10/2023   LDLCALC 86 01/10/2023   TRIG 77 01/10/2023   CHOLHDL 2.7 01/10/2023    Lab Results  Component Value Date   HGBA1C 5.0 01/10/2023   Lab Results  Component Value Date   TSH 1.330 01/10/2023       Assessment & Plan    1.  Chronic HFpEF: Hospitalized April 2024 with lower extremity edema and heart failure.  Normal LV function  by echo.  Coronary CT angiogram in December 2024 showed normal coronary arteries with calcium score 0.  He has chronic dyspnea on exertion setting of ongoing polysubstance abuse.  No chest pain.  He is euvolemic on examination with stable heart rate and blood pressure.  Follow-up basic metabolic panel today.  Continue ARB, MRA, and SGLT2 inhibitor.  2.  History of chest pain: Status post coronary CT angiogram in December 2024 showing normal coronary arteries and calcium score of 0.  No recurrent chest pain.  Reassurance provided.  3.  Polysubstance use: Continues to smoke half a pack of cigarettes, daily marijuana, and snorting heroin daily as well.  Previously followed in methadone clinic but as noted, continues to use daily.  Cessation advised.  Patient not currently contemplating quitting.  He understands the deadly consequences of ongoing heroin use.  He notes that he is already overdosed 9 different times.  4.  Hypertension: Stable on spironolactone, amlodipine, and valsartan.  5.  Gynecomastia: Incidentally noted on CT in December.  No significant gynecomastia noted on exam today.  Patient denies any subjective symptoms.  He remains on spironolactone.  Advised that if he develops symptoms, we would have a low threshold to transition to eplerenone.  6.  Disposition: Follow-up basic metabolic panel today.  Follow-up in clinic in 6 months or sooner if necessary.  Nicolasa Ducking, NP 07/25/2023, 3:31 PM

## 2023-07-26 LAB — BASIC METABOLIC PANEL
BUN/Creatinine Ratio: 15 (ref 9–20)
BUN: 16 mg/dL (ref 6–24)
CO2: 24 mmol/L (ref 20–29)
Calcium: 9.4 mg/dL (ref 8.7–10.2)
Chloride: 98 mmol/L (ref 96–106)
Creatinine, Ser: 1.06 mg/dL (ref 0.76–1.27)
Glucose: 70 mg/dL (ref 70–99)
Potassium: 4.9 mmol/L (ref 3.5–5.2)
Sodium: 138 mmol/L (ref 134–144)
eGFR: 90 mL/min/{1.73_m2} (ref >59)

## 2023-07-28 ENCOUNTER — Other Ambulatory Visit: Payer: Self-pay | Admitting: Physician Assistant

## 2023-07-28 ENCOUNTER — Other Ambulatory Visit: Payer: Self-pay

## 2023-07-29 ENCOUNTER — Other Ambulatory Visit: Payer: Self-pay

## 2023-07-29 ENCOUNTER — Other Ambulatory Visit: Payer: Self-pay | Admitting: Physician Assistant

## 2023-07-29 DIAGNOSIS — I5022 Chronic systolic (congestive) heart failure: Secondary | ICD-10-CM

## 2023-07-29 DIAGNOSIS — J4489 Other specified chronic obstructive pulmonary disease: Secondary | ICD-10-CM

## 2023-07-29 DIAGNOSIS — I10 Essential (primary) hypertension: Secondary | ICD-10-CM

## 2023-07-29 MED ORDER — ALBUTEROL SULFATE HFA 108 (90 BASE) MCG/ACT IN AERS
1.0000 | INHALATION_SPRAY | Freq: Four times a day (QID) | RESPIRATORY_TRACT | 3 refills | Status: AC | PRN
  Filled 2023-07-29: qty 18, 25d supply, fill #0
  Filled 2023-08-24: qty 18, 25d supply, fill #1
  Filled 2023-09-29: qty 18, 25d supply, fill #2
  Filled 2023-10-27: qty 18, 25d supply, fill #3
  Filled 2023-11-25: qty 18, 25d supply, fill #4
  Filled 2023-12-22: qty 18, 25d supply, fill #5
  Filled 2024-01-20: qty 18, 25d supply, fill #6
  Filled 2024-05-21: qty 6.7, 25d supply, fill #7

## 2023-07-30 ENCOUNTER — Other Ambulatory Visit: Payer: Self-pay

## 2023-07-30 MED FILL — Montelukast Sodium Tab 10 MG (Base Equiv): ORAL | 90 days supply | Qty: 90 | Fill #0 | Status: AC

## 2023-07-30 MED FILL — Furosemide Tab 20 MG: ORAL | 30 days supply | Qty: 30 | Fill #0 | Status: AC

## 2023-07-30 NOTE — Telephone Encounter (Signed)
 OV 06/19/23 Requested Prescriptions  Pending Prescriptions Disp Refills   furosemide (LASIX) 20 MG tablet 30 tablet 0    Sig: Take 1 tablet (20 mg total) by mouth daily as needed for edema or fluid.     Cardiovascular:  Diuretics - Loop Failed - 07/30/2023  1:00 PM      Failed - Mg Level in normal range and within 180 days    Magnesium  Date Value Ref Range Status  01/10/2023 2.0 1.6 - 2.3 mg/dL Final         Passed - K in normal range and within 180 days    Potassium  Date Value Ref Range Status  07/25/2023 4.9 3.5 - 5.2 mmol/L Final  09/06/2014 4.4 mmol/L Final    Comment:    3.5-5.1 NOTE: New Reference Range  08/02/14          Passed - Ca in normal range and within 180 days    Calcium  Date Value Ref Range Status  07/25/2023 9.4 8.7 - 10.2 mg/dL Final   Calcium, Total  Date Value Ref Range Status  09/06/2014 7.9 (L) mg/dL Final    Comment:    4.7-82.9 NOTE: New Reference Range  08/02/14          Passed - Na in normal range and within 180 days    Sodium  Date Value Ref Range Status  07/25/2023 138 134 - 144 mmol/L Final  09/06/2014 138 mmol/L Final    Comment:    135-145 NOTE: New Reference Range  08/02/14          Passed - Cr in normal range and within 180 days    Creatinine  Date Value Ref Range Status  09/06/2014 0.92 mg/dL Final    Comment:    5.62-1.30 NOTE: New Reference Range  08/02/14    Creatinine, Ser  Date Value Ref Range Status  07/25/2023 1.06 0.76 - 1.27 mg/dL Final         Passed - Cl in normal range and within 180 days    Chloride  Date Value Ref Range Status  07/25/2023 98 96 - 106 mmol/L Final  09/06/2014 104 mmol/L Final    Comment:    101-111 NOTE: New Reference Range  08/02/14          Passed - Last BP in normal range    BP Readings from Last 1 Encounters:  07/25/23 104/70         Passed - Valid encounter within last 6 months    Recent Outpatient Visits           2 months ago Heart failure with mildly reduced  ejection fraction (HFmrEF) [facility])   [facility] Merita Norton T, FNP   6 months ago Heart failure with mildly reduced ejection fraction (HFmrEF) [facility])   [facility] Merita Norton T, FNP   6 months ago Heart failure with mildly reduced ejection fraction (HFmrEF) [facility])   [facility] Merita Norton T, FNP       Future Appointments             In 5 months Ostwalt, Edmon Crape, PA-C [facility]             montelukast (SINGULAIR) 10 MG tablet 90 tablet 1    Sig: Take 1 tablet (10 mg total) by mouth once nightly at bedtime.     Pulmonology:  Leukotriene Inhibitors Passed - 07/30/2023  1:00 PM      Passed - Valid encounter within last 12 months    Recent Outpatient Visits           2 months ago Heart failure with mildly reduced ejection fraction (HFmrEF) [facility])   [facility] Merita Norton T, FNP   6 months ago Heart failure with mildly reduced ejection fraction (HFmrEF) [facility])   [facility] Merita Norton T, FNP   6 months ago Heart failure with mildly reduced ejection fraction (HFmrEF) [facility])    [facility] Jacky Kindle, FNP       Future Appointments             In 5 months Ostwalt, Edmon Crape, PA-C [facility]

## 2023-08-01 ENCOUNTER — Other Ambulatory Visit: Payer: Self-pay

## 2023-09-29 ENCOUNTER — Other Ambulatory Visit: Payer: Self-pay

## 2023-10-23 ENCOUNTER — Other Ambulatory Visit (HOSPITAL_COMMUNITY): Payer: Self-pay

## 2023-10-27 ENCOUNTER — Other Ambulatory Visit: Payer: Self-pay

## 2023-11-06 ENCOUNTER — Telehealth: Payer: Self-pay | Admitting: Family

## 2023-11-06 NOTE — Telephone Encounter (Signed)
 Called to confirm/remind patient of their appointment at the [facility] on 11/07/23.   Appointment:   [x] Confirmed  [] Left mess   [] No answer/No voice mail  [] VM Full/unable to leave message  [] Phone not in service  Patient reminded to bring all medications and/or complete list.  Confirmed patient has transportation. Gave directions, instructed to utilize valet parking.

## 2023-11-07 ENCOUNTER — Ambulatory Visit (HOSPITAL_BASED_OUTPATIENT_CLINIC_OR_DEPARTMENT_OTHER): Payer: MEDICAID | Admitting: Internal Medicine

## 2023-11-07 ENCOUNTER — Other Ambulatory Visit
Admission: RE | Admit: 2023-11-07 | Discharge: 2023-11-07 | Disposition: A | Discharge status: Home / Self Care | Payer: MEDICAID | Source: Ambulatory Visit | Attending: Family | Admitting: Family

## 2023-11-07 ENCOUNTER — Other Ambulatory Visit: Payer: Self-pay

## 2023-11-07 VITALS — BP 132/89 | HR 93 | Wt 177.0 lb

## 2023-11-07 DIAGNOSIS — I5032 Chronic diastolic (congestive) heart failure: Secondary | ICD-10-CM | POA: Insufficient documentation

## 2023-11-07 DIAGNOSIS — Z79899 Other long term (current) drug therapy: Secondary | ICD-10-CM | POA: Insufficient documentation

## 2023-11-07 DIAGNOSIS — J4489 Other specified chronic obstructive pulmonary disease: Secondary | ICD-10-CM | POA: Diagnosis not present

## 2023-11-07 DIAGNOSIS — Z7984 Long term (current) use of oral hypoglycemic drugs: Secondary | ICD-10-CM | POA: Diagnosis not present

## 2023-11-07 DIAGNOSIS — F17210 Nicotine dependence, cigarettes, uncomplicated: Secondary | ICD-10-CM | POA: Diagnosis not present

## 2023-11-07 DIAGNOSIS — I10 Essential (primary) hypertension: Secondary | ICD-10-CM

## 2023-11-07 DIAGNOSIS — F1910 Other psychoactive substance abuse, uncomplicated: Secondary | ICD-10-CM

## 2023-11-07 DIAGNOSIS — I110 Hypertensive heart disease with heart failure: Secondary | ICD-10-CM | POA: Insufficient documentation

## 2023-11-07 DIAGNOSIS — Z8619 Personal history of other infectious and parasitic diseases: Secondary | ICD-10-CM | POA: Diagnosis not present

## 2023-11-07 LAB — BASIC METABOLIC PANEL WITH GFR
Anion gap: 7 (ref 5–15)
BUN: 12 mg/dL (ref 6–20)
CO2: 31 mmol/L (ref 22–32)
Calcium: 9 mg/dL (ref 8.9–10.3)
Chloride: 98 mmol/L (ref 98–111)
Creatinine, Ser: 0.9 mg/dL (ref 0.61–1.24)
GFR, Estimated: >60 mL/min (ref >60)
Glucose, Bld: 95 mg/dL (ref 70–99)
Potassium: 4.3 mmol/L (ref 3.5–5.1)
Sodium: 136 mmol/L (ref 135–145)

## 2023-11-07 LAB — BRAIN NATRIURETIC PEPTIDE: B Natriuretic Peptide: 93.8 pg/mL (ref 0.0–100.0)

## 2023-11-07 MED ORDER — VALSARTAN 160 MG PO TABS
160.0000 mg | ORAL_TABLET | Freq: Every day | ORAL | 1 refills | Status: DC
  Filled 2023-11-07 – 2023-11-25 (×2): qty 90, 90d supply, fill #0
  Filled 2024-01-20 – 2024-02-05 (×2): qty 90, 90d supply, fill #1

## 2023-11-07 MED ORDER — FUROSEMIDE 20 MG PO TABS
20.0000 mg | ORAL_TABLET | Freq: Every day | ORAL | 0 refills | Status: DC
  Filled 2023-11-07 – 2023-11-25 (×2): qty 30, 30d supply, fill #0

## 2023-11-07 NOTE — Progress Notes (Signed)
 [facility] Note    PCP: Normie Becton, FNP (last seen 08/24) Cardiologist: Sammy Crisp, MD (last seen 10/24; returns 04/25)  HPI: Nathan Vasquez is a 43 y.o.male with a history of HTN, bipolar disorder, moderate persistent asthma, COPD, hepatitis C with SVR, opiod use disorder, anxiety, tobacco use and chronic heart failure. Broke his left humerus 02/24 after a fall.   Was in the ED 07/25/22 after falling. Xray confirmed left humeral neck fracture. Placed arm in sling with orthpaedic f/u.   Admitted 09/22/22 due to lower extremity swelling, generalized fatigue, dyspnea on exertion. Currently follows at outpatient methadone  clinic but recently use fentanyl. Upon arrival noted to be hypoxic requiring 6 L nasal cannula, CTA chest was negative for PE but showed bilateral nodular opacity. Patient was started on azithromycin , Rocephin  and diuretics. BNP minimally elevated. Procalcitonin is negative. COVID-negative.  Full respiratory panel-Neg   Echo 09/23/22: EF 55-60%  Cardiac CTA 04/28/23: Coronary calcium  score of 0. Normal coronary origin with right dominance. No evidence of CAD. CAD-RADS 0. Consider non-atherosclerotic causes of chest pain.  Today he returns for AHF follow up with his girlfriend. Overall feeling ok but he has been more SOB recently. Denies palpitations, CP, dizziness, edema, or PND/Orthopnea. Has been feeling SOB for the last week and when going up the steps. They do not have AC in their car which affects his breathing as well. Appetite ok. No fever or chills. Has been getting over a cold recently as kid mom watches kids and they get him sick frequently. Weight at home 175 pounds. Taking all medications. Denies ETOH use. Smokes 1/2PPD. Marijuana use daily.   ROS: All systems negative except as listed in HPI, PMH and Problem List.  SH:  Social History   Socioeconomic History   Marital status: Single    Spouse name: Not on file   Number of children: 1    Years of education: 6th grade   Highest education level: 6th grade  Occupational History   Occupation: NA  Tobacco Use   Smoking status: Every Day    Current packs/day: 0.50    Average packs/day: 0.5 packs/day for 26.0 years (13.0 ttl pk-yrs)    Types: Cigarettes   Smokeless tobacco: Current  Vaping Use   Vaping status: Former   Quit date: 12/21/2019  Substance and Sexual Activity   Alcohol  use: Not Currently    Comment: seldom, once every 3-6 months   Drug use: Not Currently    Frequency: 7.0 times per week    Types: Heroin, Marijuana, Cocaine    Comment: last use 07/24/2023 heroin/Marijuana use 07/24/2023   Sexual activity: Yes    Partners: Female  Other Topics Concern   Not on file  Social History Narrative   Not on file   Social Drivers of Health   Financial Resource Strain: High Risk (04/10/2022)   Overall Financial Resource Strain (CARDIA)    Difficulty of Paying Living Expenses: Very hard  Food Insecurity: Food Insecurity Present (01/29/2023)   Hunger Vital Sign    Worried About Running Out of Food in the Last Year: Sometimes true    Ran Out of Food in the Last Year: Sometimes true  Transportation Needs: No Transportation Needs (01/29/2023)   PRAPARE - Administrator, Civil Service (Medical): No    Lack of Transportation (Non-Medical): No  Physical Activity: Inactive (04/10/2022)   Exercise Vital Sign    Days of Exercise per Week: 0 days  Minutes of Exercise per Session: 0 min  Stress: Stress Concern Present (04/10/2022)   [employer] of Occupational Health - Occupational Stress Questionnaire    Feeling of Stress : Very much  Social Connections: Socially Isolated (04/10/2022)   Social Connection and Isolation Panel    Frequency of Communication with Friends and Family: More than three times a week    Frequency of Social Gatherings with Friends and Family: Not on file    Attends Religious Services: Never    Database administrator or  Organizations: No    Attends Banker Meetings: Never    Marital Status: Never married  Intimate Partner Violence: Not At Risk (04/10/2022)   Humiliation, Afraid, Rape, and Kick questionnaire    Fear of Current or Ex-Partner: No    Emotionally Abused: No    Physically Abused: No    Sexually Abused: No   FH:  Family History  Problem Relation Age of Onset   COPD Mother    Diabetes Mother    Heart disease Mother    Anxiety disorder Mother    Stroke Father 40   Schizophrenia Father    Diabetes Maternal Grandmother    Diabetes Maternal Grandfather    Cancer Maternal Grandfather    Diabetes Paternal Grandmother    Diabetes Paternal Grandfather    Heart attack Paternal Grandfather    Past Medical History:  Diagnosis Date   (HFpEF) heart failure with preserved ejection fraction (HCC)    a. 08/2022 Echo: EF 55-60%, no rwma, nl RV fxn.   Asthma    Chest pain    a. 04/2023 Cor CTA: nl cors. Ca2+= 0.   COPD (chronic obstructive pulmonary disease) (HCC)    Gynecomastia    a. Noted on CT 04/2023 - in setting of spironolactone  rx.   Hep C w/o coma, chronic (HCC)    Heroin abuse (HCC)    a. OD x 9.   History of balanitis    Hypertension    Current Outpatient Medications  Medication Sig Dispense Refill   albuterol  (PROVENTIL ) (2.5 MG/3ML) 0.083% nebulizer solution Take 2.5 mg by nebulization every 6 (six) hours as needed for wheezing or shortness of breath.     albuterol  (VENTOLIN  HFA) 108 (90 Base) MCG/ACT inhaler Inhale 1-2 puffs into the lungs every 6 (six) hours as needed for wheezing or shortness of breath. 36 g 3   amLODipine  (NORVASC ) 5 MG tablet Take 1 tablet (5 mg total) by mouth daily. 90 tablet 4   empagliflozin  (JARDIANCE ) 10 MG TABS tablet Take 1 tablet (10 mg total) by mouth daily before breakfast. 90 tablet 4   fluticasone -salmeterol (ADVAIR  DISKUS) 500-50 MCG/ACT AEPB Inhale 1 puff into the lungs in the morning and at bedtime. 60 each 11   furosemide  (LASIX )  20 MG tablet Take 1 tablet (20 mg total) by mouth daily as needed for edema or fluid. 30 tablet 0   furosemide  (LASIX ) 20 MG tablet Take 1 tablet (20 mg total) by mouth daily for 7 days. 30 tablet 0   METHADONE  HCL PO Take 120 mg by mouth daily.     montelukast  (SINGULAIR ) 10 MG tablet Take 1 tablet (10 mg total) by mouth once nightly at bedtime. 90 tablet 1   nystatin  (MYCOSTATIN ) 100000 UNIT/ML suspension Take 5 mLs by mouth 4 (four) times daily.     spironolactone  (ALDACTONE ) 25 MG tablet Take 1 tablet (25 mg total) by mouth daily. 90 tablet 3   valsartan  (DIOVAN ) 160 MG  tablet Take 1 tablet (160 mg total) by mouth daily. 90 tablet 1   No current facility-administered medications for this visit.   Vitals:   11/07/23 1414  BP: 132/89  Pulse: 93  SpO2: 92%  Weight: 177 lb (80.3 kg)    Wt Readings from Last 3 Encounters:  11/07/23 177 lb (80.3 kg)  07/25/23 165 lb (74.8 kg)  05/30/23 172 lb (78 kg)   Lab Results  Component Value Date   CREATININE 1.06 07/25/2023   CREATININE 0.81 03/19/2023   CREATININE 0.76 02/24/2023   PHYSICAL EXAM: General:  well appearing.  No respiratory difficulty. Walked into clinic.  Neck: supple. JVD ~8 cm.  Cor: PMI nondisplaced. Regular rate & rhythm. No rubs, gallops or murmurs. Lungs: coarse throughout Extremities: no cyanosis, clubbing, rash, edema  Neuro: alert & oriented x 3. Moves all 4 extremities w/o difficulty. Affect pleasant.   ECG: not done  ASSESSMENT & PLAN:  1: Chronic heart failure with preserved ejection fraction- - suspect due to HTN/ substance use - NYHA class II - Echo 09/23/22: EF 55-60% - Cardiac CTA 04/28/23: Coronary calcium  score of 0. Normal coronary origin with right dominance.  No evidence of CAD. CAD-RADS 0. Consider non-atherosclerotic causes of chest pain - saw cardiology (End)  2/25 - Volume difficult to gauge but at least mildly volume overloaded from high volume intake (drinks lots of soda throughout the  day) - continue jardiance  10mg  daily  - continue furosemide  20mg  daily PRN, will have him take 20 mg for 5 days then back to PRN. Has never taken PRN lasix  unless instructed.  - Increase valsartan  80>160 mg daily - Continue spiro 25 mg daily - Labs today  2: HTN- - BP slightly elevated today - Increase valsartan  to 160 mg daily - continue amlodipine  5mg  daily  3: Substance abuse- - follows at methadone  clinic 3 days/ week - Smoking 1/2 PPD, knows that he needs to quit. Trying.   4: Moderate persistent asthma- - continue singulair  10mg  daily - continue advair  500/50 BID & make sure rinse/ spit after using this - has nebulizer for PRN usage   Follow up in 4-6 weeks with APP to follow up on fluid. Would benefit from ReDS clip. Then he can follow up in ~6 months with APP and update echo.   Sheryl Donna AGACNP-BC

## 2023-11-07 NOTE — Patient Instructions (Addendum)
 Medication Changes:  START LASIX  20 MG FOR 1 WK   INCREASE LOSARTAN TO 160 MG ONCE DAILY   Lab Work:  Go over to the MEDICAL MALL. Go pass the gift shop and have your blood work completed TODAY AND AGAIN IN 1 WEEK AFTER STARTING MEDICATION CHANGES.  We will only call you if the results are abnormal or if the provider would like to make medication changes.   Testing/Procedures:  Please have your echo completed. You will check in for this at the [facility]. You have to arrive 15 MINS EARLY for preparation, otherwise you will have to reschedule.   Follow-Up in: 4 WEEKS WITH TINA AND IN 6 MONTHS AFTER YOUR ECHO.  At the [facility], you and your health needs are our priority. We have a designated team specialized in the treatment of Heart Failure. This Care Team includes your primary Heart Failure Specialized Cardiologist (physician), Advanced Practice Providers (APPs- Physician Assistants and Nurse Practitioners), and Pharmacist who all work together to provide you with the care you need, when you need it.   You may see any of the following providers on your designated Care Team at your next follow up:  Dr. Jules Oar Dr. Peder Bourdon Dr. Alwin Baars Dr. Judyth Nunnery Shawnee Dellen, FNP Bevely Brush, RPH-CPP  Please be sure to bring in all your medications bottles to every appointment.   Need to Contact Us :  If you have any questions or concerns before your next appointment please send us  a message through Gray or call our office at [phone].    TO LEAVE A MESSAGE FOR THE NURSE SELECT OPTION 2, PLEASE LEAVE A MESSAGE INCLUDING: YOUR NAME DATE OF BIRTH CALL BACK NUMBER REASON FOR CALL**this is important as we prioritize the call backs  YOU WILL RECEIVE A CALL BACK THE SAME DAY AS LONG AS YOU CALL BEFORE 4:00 PM

## 2023-11-09 ENCOUNTER — Ambulatory Visit: Payer: Self-pay | Admitting: Internal Medicine

## 2023-11-17 ENCOUNTER — Other Ambulatory Visit: Payer: Self-pay

## 2023-11-25 ENCOUNTER — Other Ambulatory Visit: Payer: Self-pay

## 2023-11-25 MED FILL — Montelukast Sodium Tab 10 MG (Base Equiv): ORAL | 90 days supply | Qty: 90 | Fill #1 | Status: AC

## 2023-11-26 ENCOUNTER — Other Ambulatory Visit: Payer: Self-pay

## 2023-11-26 MED ORDER — DOXYCYCLINE HYCLATE 100 MG PO TABS
100.0000 mg | ORAL_TABLET | Freq: Two times a day (BID) | ORAL | 0 refills | Status: DC
  Filled 2023-11-26: qty 14, 7d supply, fill #0

## 2023-12-06 IMAGING — CR DG CHEST 2V
1 series · 2 of 2 positions shown · non-contrast
Comparison: Chest radiograph 09/16/2017

CLINICAL DATA: Worsened shortness of breath, cough, congestion
chest pain

EXAM:
CHEST - 2 VIEW

[Series 1: dg chest 2 view · 0.14mm/px · 2 of 2 slices shown]
[im 1/2]
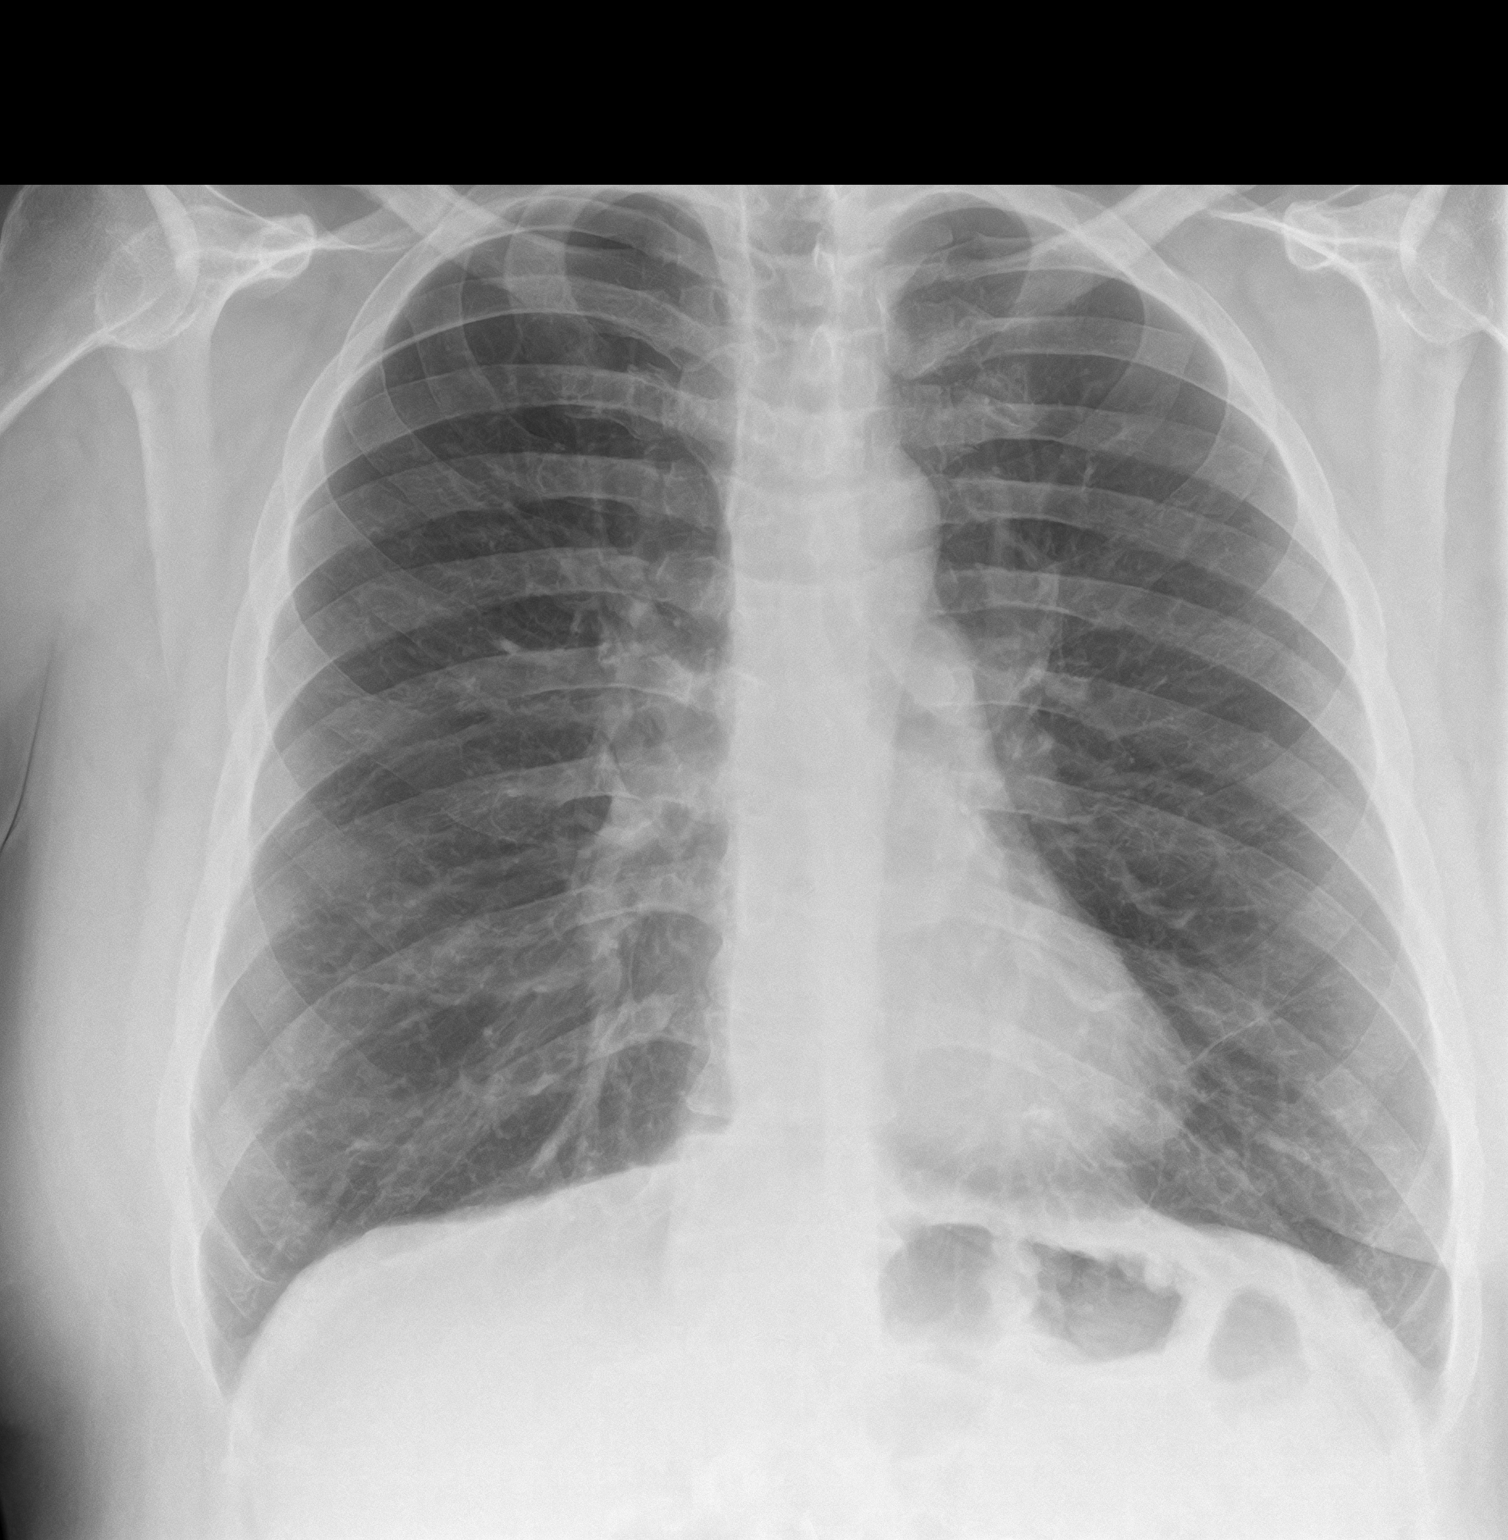
[im 2/2]
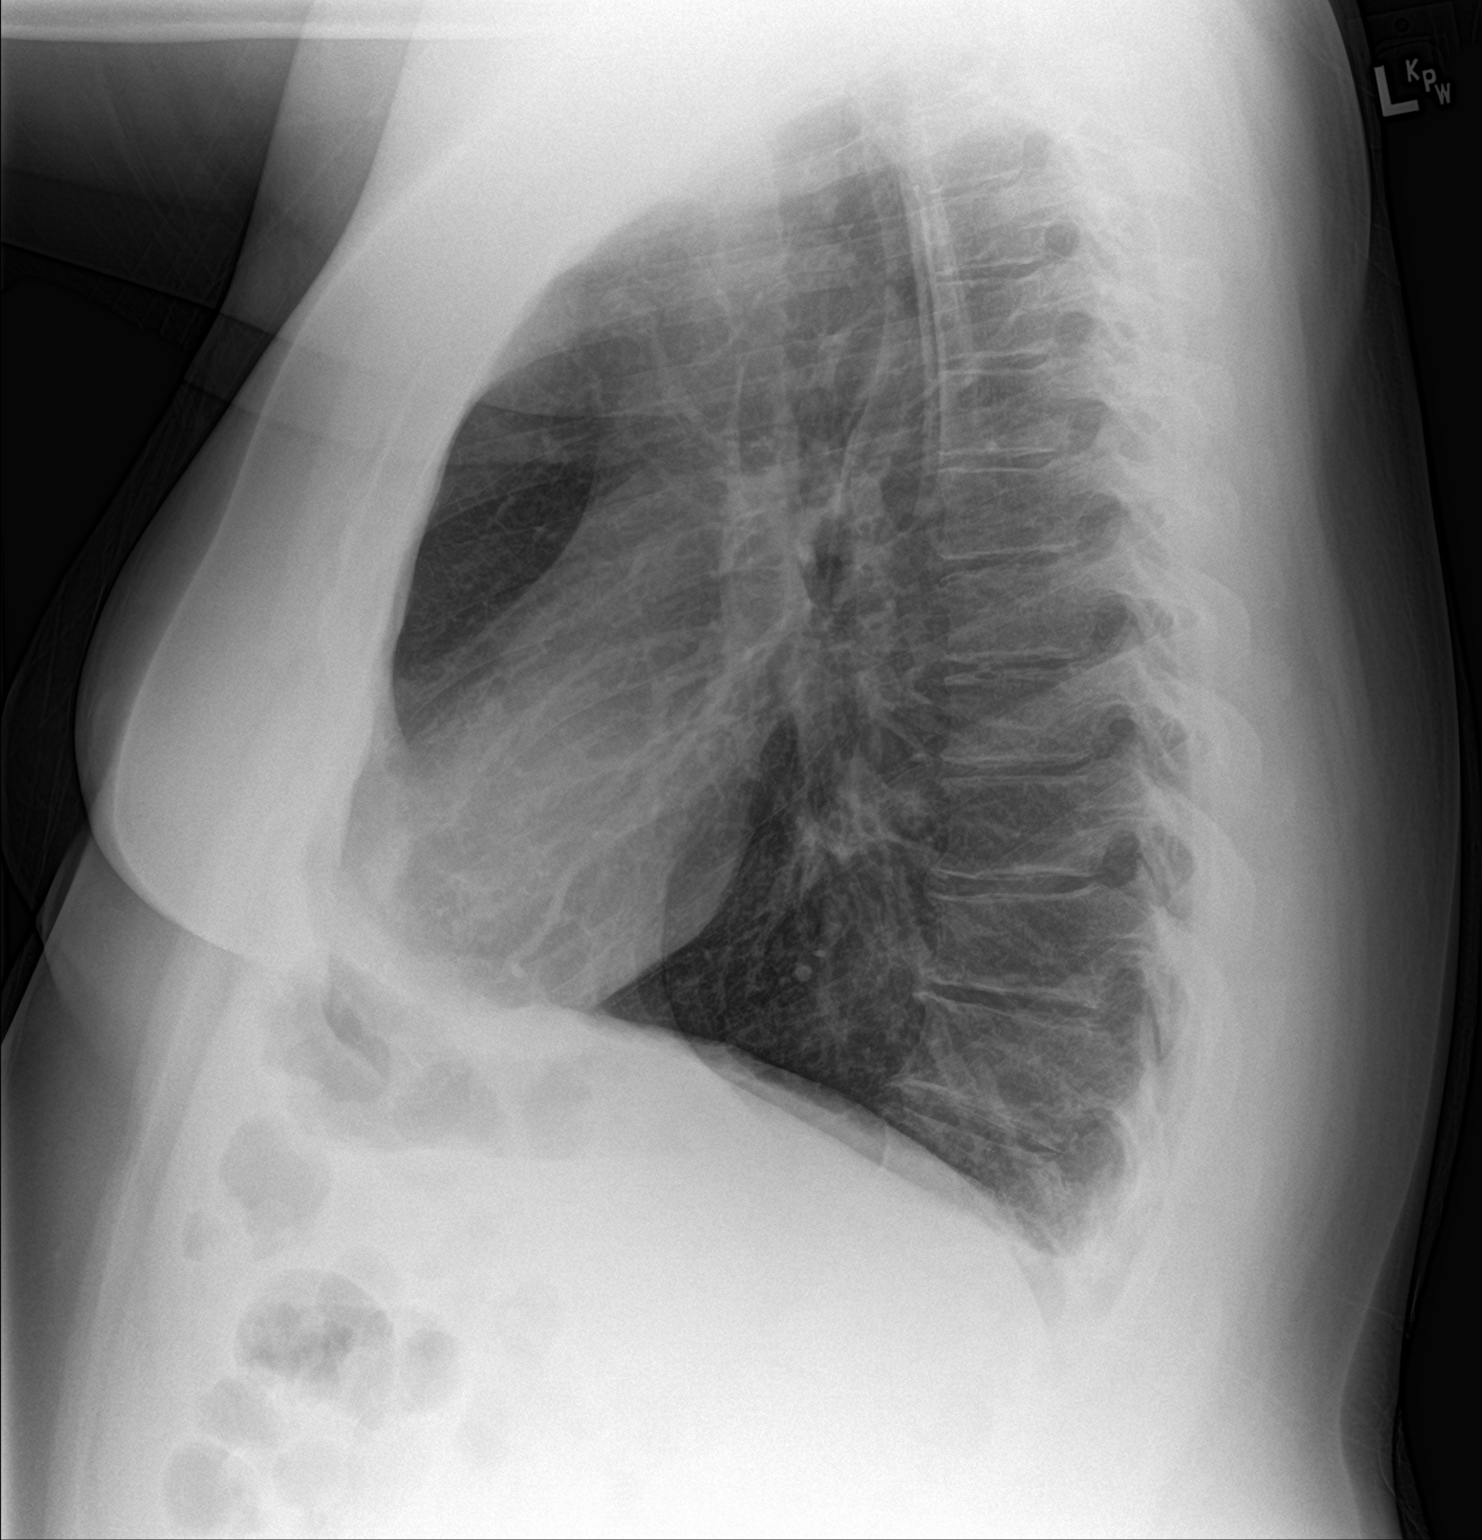

[2 of 2 positions shown; findings below may reference images not displayed]

FINDINGS: The cardiomediastinal silhouette is normal.

The lungs hyperinflated with flattening of the diaphragms suggesting
underlying COPD. There is no focal consolidation or pulmonary edema.
There is no pleural effusion or pneumothorax

There is no acute osseous abnormality.
IMPRESSION: Hyperinflated lungs suggesting COPD. No radiographic evidence of
acute cardiopulmonary process.

## 2023-12-10 ENCOUNTER — Telehealth: Payer: Self-pay | Admitting: Family

## 2023-12-10 NOTE — Progress Notes (Unsigned)
 [facility] Note    PCP: Emilio Kelly DASEN, FNP (last seen 08/24) Cardiologist: Mady Bruckner, MD (last seen 10/24; returns 04/25)  HPI: Nathan Vasquez is a 43 y.o.male with a history of HTN, bipolar disorder, moderate persistent asthma, COPD, hepatitis C with SVR, opiod use disorder, anxiety, tobacco use and chronic heart failure. Broke his left humerus 02/24 after a fall.   Was in the ED 07/25/22 after falling. Xray confirmed left humeral neck fracture. Placed arm in sling with orthpaedic f/u.   Admitted 09/22/22 due to lower extremity swelling, generalized fatigue, dyspnea on exertion. Currently follows at outpatient methadone  clinic but recently use fentanyl. Upon arrival noted to be hypoxic requiring 6 L nasal cannula, CTA chest was negative for PE but showed bilateral nodular opacity. Patient was started on azithromycin , Rocephin  and diuretics. BNP minimally elevated. Procalcitonin is negative. COVID-negative.  Full respiratory panel-Neg   Echo 09/23/22: EF 55-60%  Cardiac CTA 04/28/23: Coronary calcium  score of 0. Normal coronary origin with right dominance. No evidence of CAD. CAD-RADS 0. Consider non-atherosclerotic causes of chest pain.  Today he returns for AHF follow up with his girlfriend. Overall feeling ok but he has been more SOB recently. Denies palpitations, CP, dizziness, edema, or PND/Orthopnea. Has been feeling SOB for the last week and when going up the steps. They do not have AC in their car which affects his breathing as well. Appetite ok. No fever or chills. Has been getting over a cold recently as kid mom watches kids and they get him sick frequently. Weight at home 175 pounds. Taking all medications. Denies ETOH use. Smokes 1/2PPD. Marijuana use daily.   ROS: All systems negative except as listed in HPI, PMH and Problem List.  SH:  Social History   Socioeconomic History   Marital status: Single    Spouse name: Not on file   Number of children: 1    Years of education: 6th grade   Highest education level: 6th grade  Occupational History   Occupation: NA  Tobacco Use   Smoking status: Every Day    Current packs/day: 0.50    Average packs/day: 0.5 packs/day for 26.0 years (13.0 ttl pk-yrs)    Types: Cigarettes   Smokeless tobacco: Current  Vaping Use   Vaping status: Former   Quit date: 12/21/2019  Substance and Sexual Activity   Alcohol  use: Not Currently    Comment: seldom, once every 3-6 months   Drug use: Not Currently    Frequency: 7.0 times per week    Types: Heroin, Marijuana, Cocaine    Comment: last use 07/24/2023 heroin/Marijuana use 07/24/2023   Sexual activity: Yes    Partners: Female  Other Topics Concern   Not on file  Social History Narrative   Not on file   Social Drivers of Health   Financial Resource Strain: High Risk (04/10/2022)   Overall Financial Resource Strain (CARDIA)    Difficulty of Paying Living Expenses: Very hard  Food Insecurity: Food Insecurity Present (01/29/2023)   Hunger Vital Sign    Worried About Running Out of Food in the Last Year: Sometimes true    Ran Out of Food in the Last Year: Sometimes true  Transportation Needs: No Transportation Needs (01/29/2023)   PRAPARE - Administrator, Civil Service (Medical): No    Lack of Transportation (Non-Medical): No  Physical Activity: Inactive (04/10/2022)   Exercise Vital Sign    Days of Exercise per Week: 0 days  Minutes of Exercise per Session: 0 min  Stress: Stress Concern Present (04/10/2022)   Nathan Vasquez of Occupational Health - Occupational Stress Questionnaire    Feeling of Stress : Very much  Social Connections: Socially Isolated (04/10/2022)   Social Connection and Isolation Panel    Frequency of Communication with Friends and Family: More than three times a week    Frequency of Social Gatherings with Friends and Family: Not on file    Attends Religious Services: Never    Database administrator or  Organizations: No    Attends Banker Meetings: Never    Marital Status: Never married  Intimate Partner Violence: Not At Risk (04/10/2022)   Humiliation, Afraid, Rape, and Kick questionnaire    Fear of Current or Ex-Partner: No    Emotionally Abused: No    Physically Abused: No    Sexually Abused: No   FH:  Family History  Problem Relation Age of Onset   COPD Mother    Diabetes Mother    Heart disease Mother    Anxiety disorder Mother    Stroke Father 27   Schizophrenia Father    Diabetes Maternal Grandmother    Diabetes Maternal Grandfather    Cancer Maternal Grandfather    Diabetes Paternal Grandmother    Diabetes Paternal Grandfather    Heart attack Paternal Grandfather    Past Medical History:  Diagnosis Date   (HFpEF) heart failure with preserved ejection fraction (HCC)    a. 08/2022 Echo: EF 55-60%, no rwma, nl RV fxn.   Asthma    Chest pain    a. 04/2023 Cor CTA: nl cors. Ca2+= 0.   COPD (chronic obstructive pulmonary disease) (HCC)    Gynecomastia    a. Noted on CT 04/2023 - in setting of spironolactone  rx.   Hep C w/o coma, chronic (HCC)    Heroin abuse (HCC)    a. OD x 9.   History of balanitis    Hypertension    Current Outpatient Medications  Medication Sig Dispense Refill   albuterol  (PROVENTIL ) (2.5 MG/3ML) 0.083% nebulizer solution Take 2.5 mg by nebulization every 6 (six) hours as needed for wheezing or shortness of breath.     albuterol  (VENTOLIN  HFA) 108 (90 Base) MCG/ACT inhaler Inhale 1-2 puffs into the lungs every 6 (six) hours as needed for wheezing or shortness of breath. 36 g 3   amLODipine  (NORVASC ) 5 MG tablet Take 1 tablet (5 mg total) by mouth daily. 90 tablet 4   doxycycline  (VIBRA -TABS) 100 MG tablet Take 1 tablet (100 mg total) by mouth 2 (two) times daily. 14 tablet 0   empagliflozin  (JARDIANCE ) 10 MG TABS tablet Take 1 tablet (10 mg total) by mouth daily before breakfast. 90 tablet 4   fluticasone -salmeterol (ADVAIR  DISKUS)  500-50 MCG/ACT AEPB Inhale 1 puff into the lungs in the morning and at bedtime. 60 each 11   furosemide  (LASIX ) 20 MG tablet Take 1 tablet (20 mg total) by mouth daily as needed for edema or fluid. 30 tablet 0   furosemide  (LASIX ) 20 MG tablet Take 1 tablet (20 mg total) by mouth daily for 7 days. 30 tablet 0   METHADONE  HCL PO Take 120 mg by mouth daily.     montelukast  (SINGULAIR ) 10 MG tablet Take 1 tablet (10 mg total) by mouth once nightly at bedtime. 90 tablet 1   nystatin  (MYCOSTATIN ) 100000 UNIT/ML suspension Take 5 mLs by mouth 4 (four) times daily.     spironolactone  (  ALDACTONE ) 25 MG tablet Take 1 tablet (25 mg total) by mouth daily. 90 tablet 3   valsartan  (DIOVAN ) 160 MG tablet Take 1 tablet (160 mg total) by mouth daily. 90 tablet 1   No current facility-administered medications for this visit.   There were no vitals filed for this visit.   Wt Readings from Last 3 Encounters:  11/07/23 177 lb (80.3 kg)  07/25/23 165 lb (74.8 kg)  05/30/23 172 lb (78 kg)   Lab Results  Component Value Date   CREATININE 0.90 11/07/2023   CREATININE 1.06 07/25/2023   CREATININE 0.81 03/19/2023   PHYSICAL EXAM: General:  well appearing.  No respiratory difficulty. Walked into clinic.  Neck: supple. JVD ~8 cm.  Cor: PMI nondisplaced. Regular rate & rhythm. No rubs, gallops or murmurs. Lungs: coarse throughout Extremities: no cyanosis, clubbing, rash, edema  Neuro: alert & oriented x 3. Moves all 4 extremities w/o difficulty. Affect pleasant.   ECG: not done  ASSESSMENT & PLAN:  1: Chronic heart failure with preserved ejection fraction- - suspect due to HTN/ substance use - NYHA class II - Echo 09/23/22: EF 55-60% - Cardiac CTA 04/28/23: Coronary calcium  score of 0. Normal coronary origin with right dominance.  No evidence of CAD. CAD-RADS 0. Consider non-atherosclerotic causes of chest pain - saw cardiology (End)  2/25 - Volume difficult to gauge but at least mildly volume overloaded  from high volume intake (drinks lots of soda throughout the day) - continue jardiance  10mg  daily  - continue furosemide  20mg  daily PRN, will have him take 20 mg for 5 days then back to PRN. Has never taken PRN lasix  unless instructed.  - Increase valsartan  80>160 mg daily - Continue spiro 25 mg daily - Labs today  2: HTN- - BP slightly elevated today - Increase valsartan  to 160 mg daily - continue amlodipine  5mg  daily  3: Substance abuse- - follows at methadone  clinic 3 days/ week - Smoking 1/2 PPD, knows that he needs to quit. Trying.   4: Moderate persistent asthma- - continue singulair  10mg  daily - continue advair  500/50 BID & make sure rinse/ spit after using this - has nebulizer for PRN usage   Follow up in 4-6 weeks with APP to follow up on fluid. Would benefit from ReDS clip. Then he can follow up in ~6 months with APP and update echo.   Nathan Vasquez Class AGACNP-BC

## 2023-12-10 NOTE — Telephone Encounter (Signed)
 Called to confirm/remind patient of their appointment at the [facility] on 12/11/23.   Appointment:   [x] Confirmed  [] Left mess   [] No answer/No voice mail  [] VM Full/unable to leave message  [] Phone not in service  Patient reminded to bring all medications and/or complete list.  Confirmed patient has transportation. Gave directions, instructed to utilize valet parking.

## 2023-12-11 ENCOUNTER — Ambulatory Visit: Payer: MEDICAID | Admitting: Family

## 2023-12-11 ENCOUNTER — Ambulatory Visit: Payer: Self-pay | Admitting: Family

## 2023-12-11 ENCOUNTER — Other Ambulatory Visit
Admission: RE | Admit: 2023-12-11 | Discharge: 2023-12-11 | Disposition: A | Discharge status: Home / Self Care | Payer: MEDICAID | Source: Ambulatory Visit | Attending: Family | Admitting: Family

## 2023-12-11 ENCOUNTER — Encounter: Payer: Self-pay | Admitting: Family

## 2023-12-11 VITALS — BP 119/78 | HR 83 | Wt 178.8 lb

## 2023-12-11 DIAGNOSIS — I5032 Chronic diastolic (congestive) heart failure: Secondary | ICD-10-CM | POA: Diagnosis present

## 2023-12-11 DIAGNOSIS — F1910 Other psychoactive substance abuse, uncomplicated: Secondary | ICD-10-CM | POA: Diagnosis not present

## 2023-12-11 DIAGNOSIS — I10 Essential (primary) hypertension: Secondary | ICD-10-CM

## 2023-12-11 DIAGNOSIS — J4489 Other specified chronic obstructive pulmonary disease: Secondary | ICD-10-CM

## 2023-12-11 LAB — BASIC METABOLIC PANEL WITH GFR
Anion gap: 11 (ref 5–15)
BUN: 16 mg/dL (ref 6–20)
CO2: 33 mmol/L — ABNORMAL HIGH (ref 22–32)
Calcium: 9.3 mg/dL (ref 8.9–10.3)
Chloride: 94 mmol/L — ABNORMAL LOW (ref 98–111)
Creatinine, Ser: 1 mg/dL (ref 0.61–1.24)
GFR, Estimated: >60 mL/min (ref >60)
Glucose, Bld: 82 mg/dL (ref 70–99)
Potassium: 4.4 mmol/L (ref 3.5–5.1)
Sodium: 138 mmol/L (ref 135–145)

## 2023-12-11 NOTE — Patient Instructions (Addendum)
 It was good to see you today! Medication Changes:  No medication changes today!  Lab Work:  Go over to the [facility]. Go pass the gift shop and have your blood work completed.  We will only call you if the results are abnormal or if the provider would like to make medication changes.   Follow-Up in: Please keep follow up appointment in December.   At the [facility], you and your health needs are our priority. We have a designated team specialized in the treatment of Heart Failure. This Care Team includes your primary Heart Failure Specialized Cardiologist (physician), Advanced Practice Providers (APPs- Physician Assistants and Nurse Practitioners), and Pharmacist who all work together to provide you with the care you need, when you need it.   You may see any of the following providers on your designated Care Team at your next follow up:  Dr. Toribio Fuel Dr. Ezra Shuck Dr. Ria Commander Dr. Odis Brownie Ellouise Class, FNP Jaun Bash, RPH-CPP  Please be sure to bring in all your medications bottles to every appointment.   Need to Contact Us :  If you have any questions or concerns before your next appointment please send us  a message through [place] or call our office at [phone].    TO LEAVE A MESSAGE FOR THE NURSE SELECT OPTION 2, PLEASE LEAVE A MESSAGE INCLUDING: YOUR NAME DATE OF BIRTH CALL BACK NUMBER REASON FOR CALL**this is important as we prioritize the call backs  YOU WILL RECEIVE A CALL BACK THE SAME DAY AS LONG AS YOU CALL BEFORE 4:00 PM

## 2024-01-05 IMAGING — CR DG CHEST 2V
1 series · 2 of 2 positions shown · non-contrast
Comparison: Previous studies including the examination of
08/15/2021

CLINICAL DATA: Shortness of breath

EXAM:
CHEST - 2 VIEW

[Series 1: dg chest 2 view · 0.14mm/px · 2 of 2 slices shown]
[im 1/2]
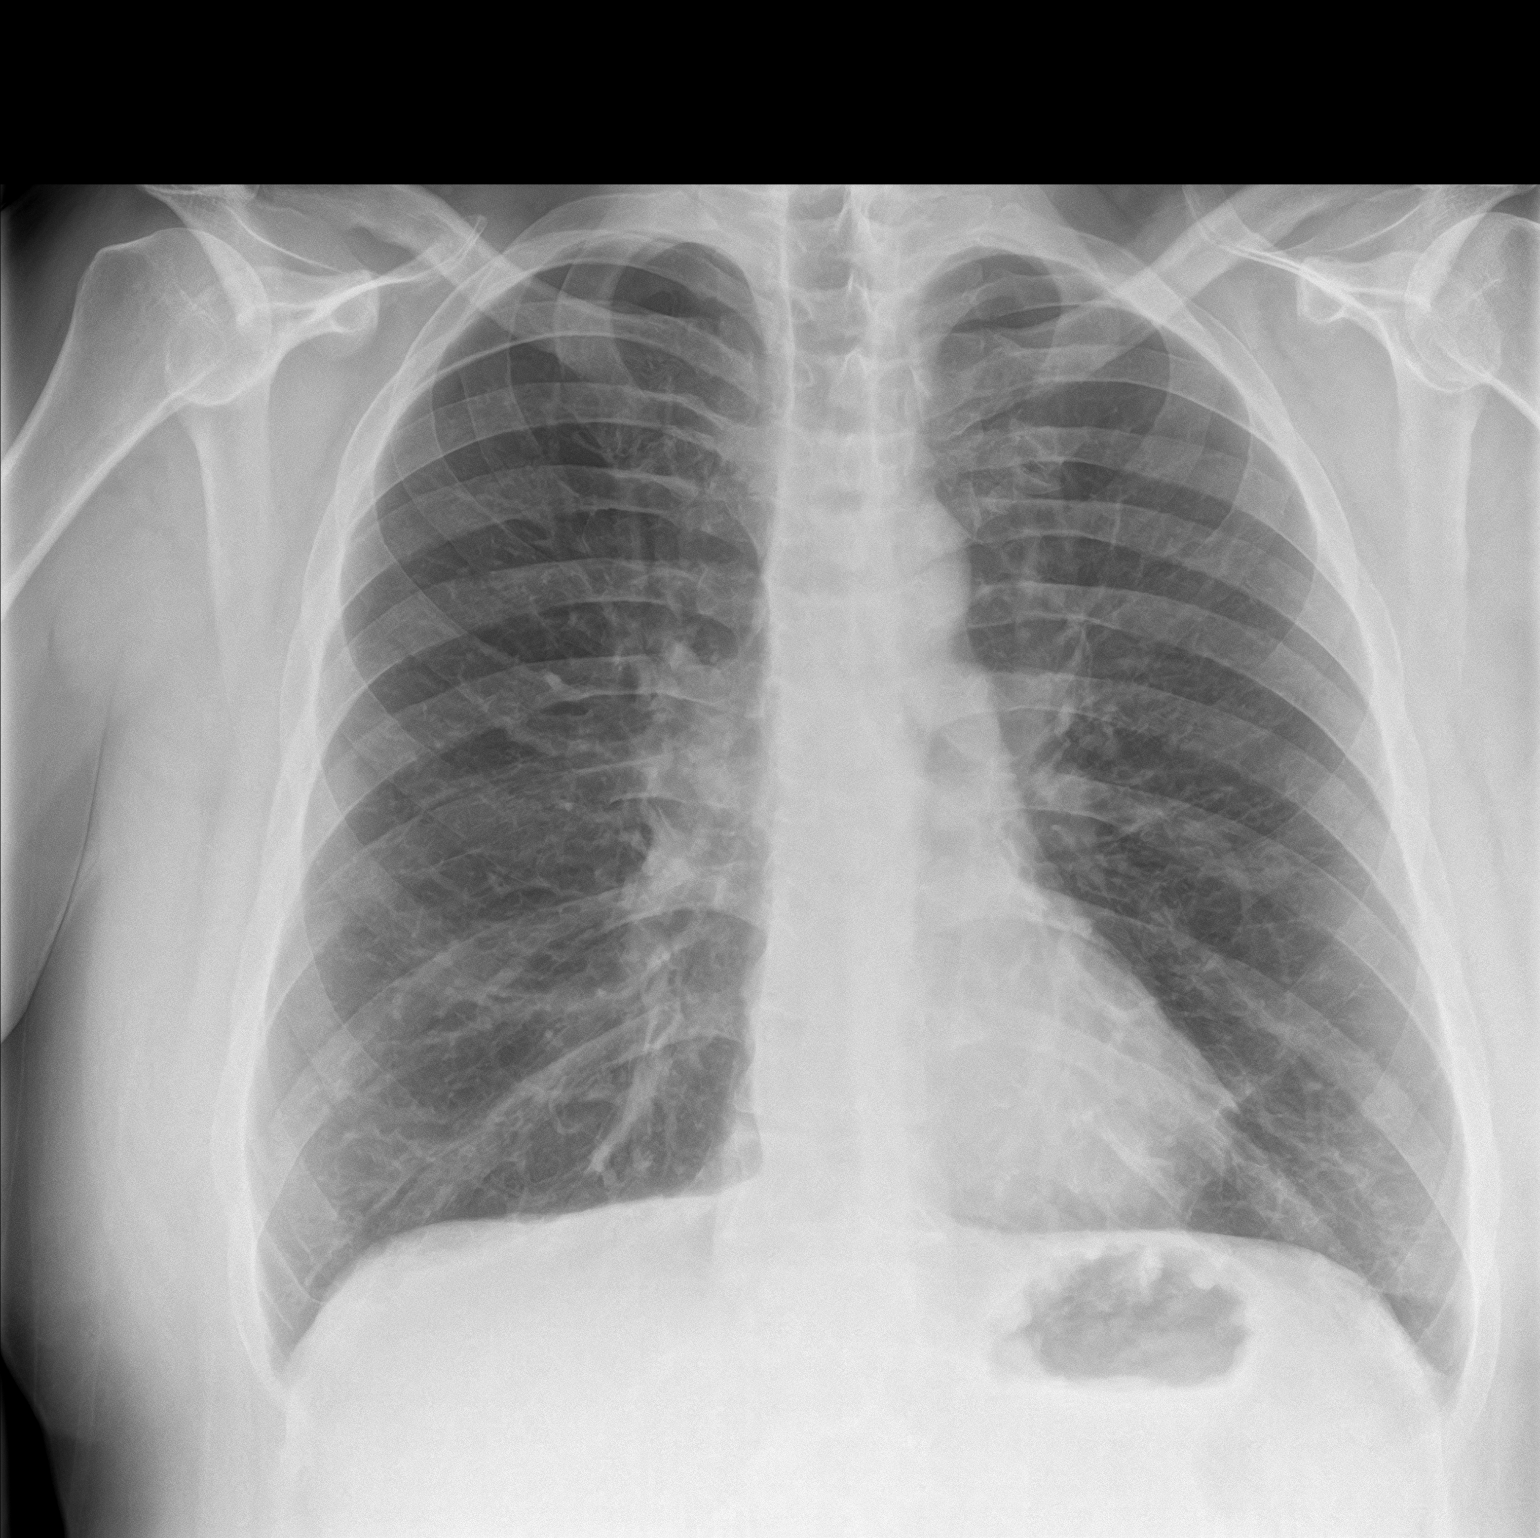
[im 2/2]
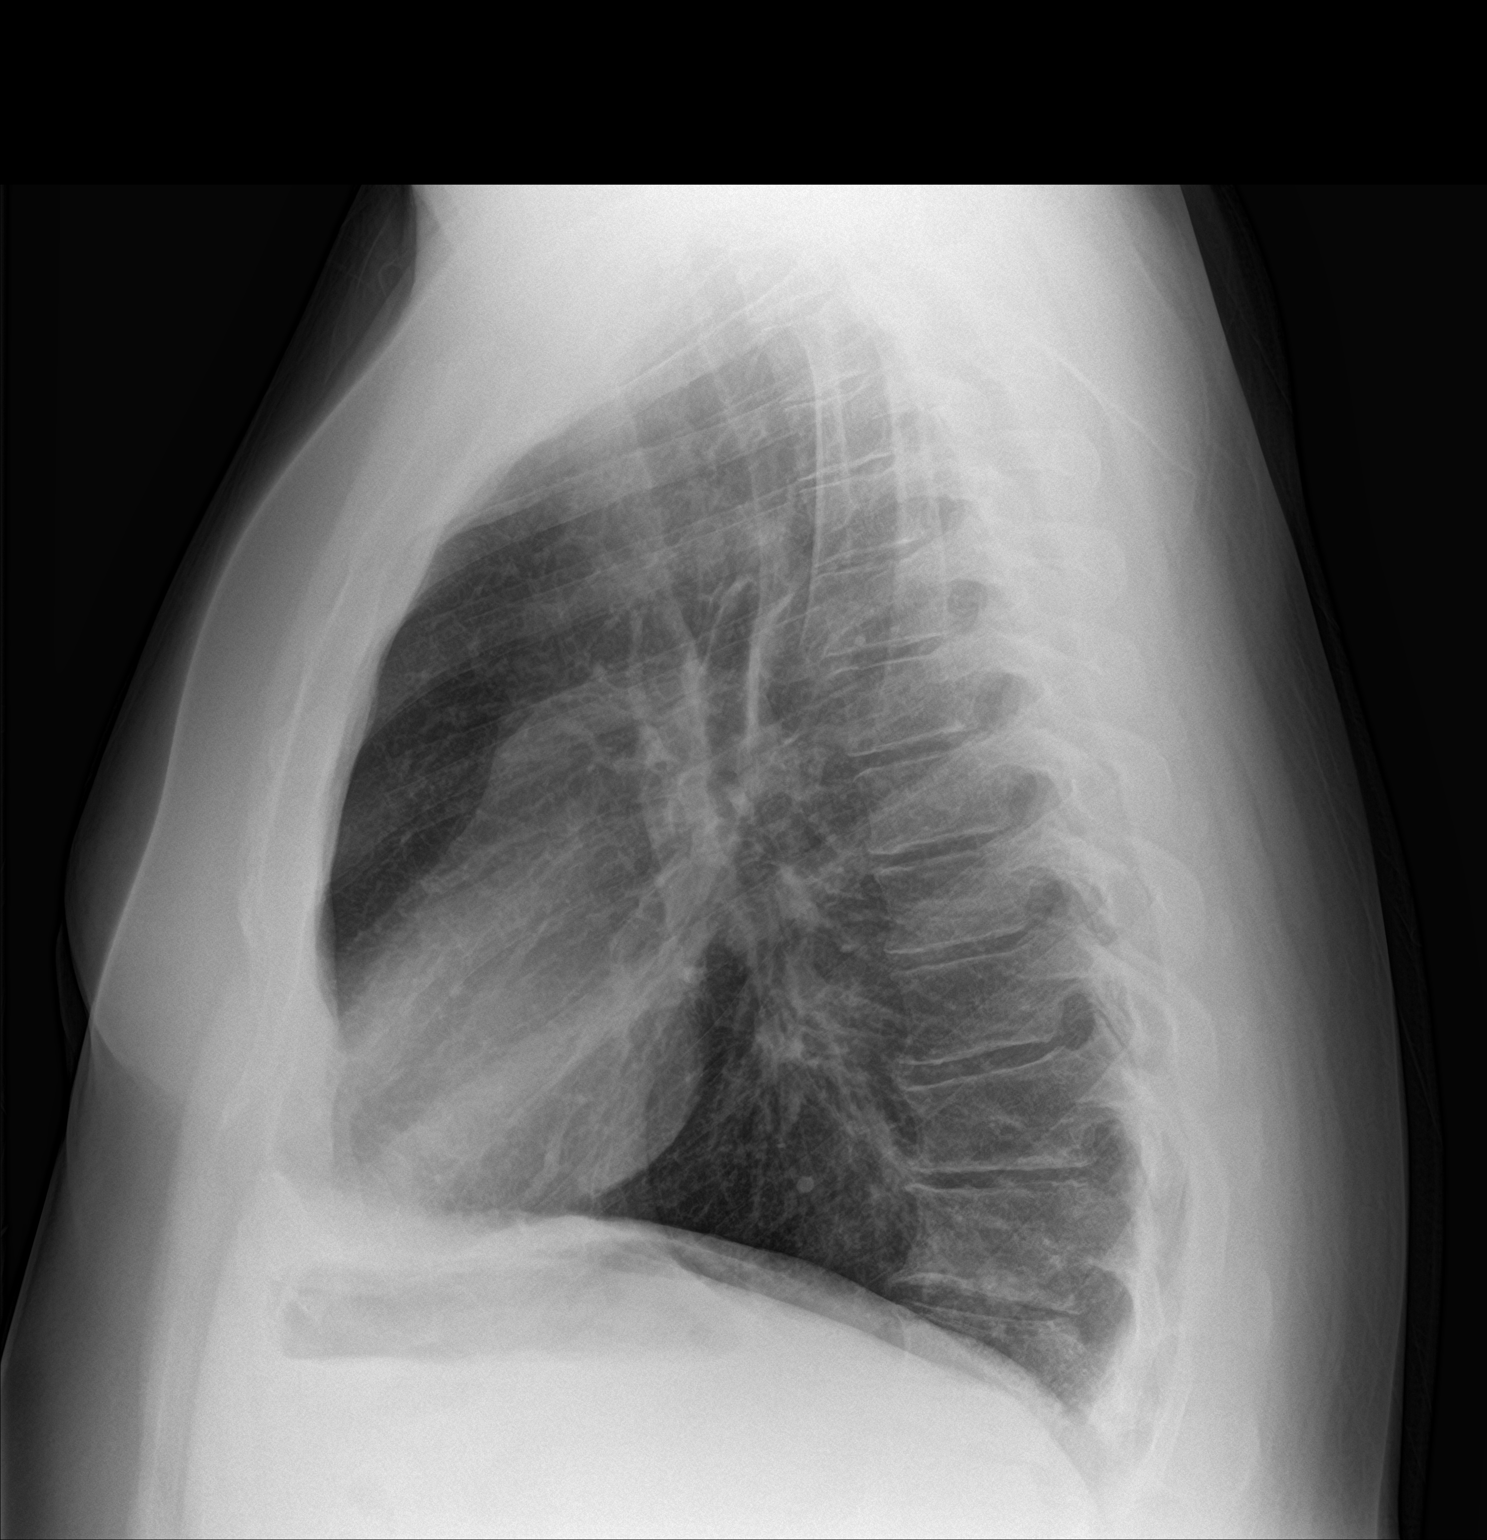

[2 of 2 positions shown; findings below may reference images not displayed]

FINDINGS: Cardiac size is within normal limits. Increase in AP diameter of
chest suggests COPD. There are no signs of pulmonary edema or new
focal infiltrates. There is no pleural effusion or pneumothorax.
IMPRESSION: COPD.  There are no new infiltrates or signs of pulmonary edema.

## 2024-01-12 ENCOUNTER — Ambulatory Visit (INDEPENDENT_AMBULATORY_CARE_PROVIDER_SITE_OTHER): Payer: MEDICAID | Admitting: Physician Assistant

## 2024-01-12 VITALS — BP 124/81 | HR 82 | Ht 72.0 in | Wt 181.8 lb

## 2024-01-12 DIAGNOSIS — Z0000 Encounter for general adult medical examination without abnormal findings: Secondary | ICD-10-CM | POA: Diagnosis not present

## 2024-01-12 DIAGNOSIS — I5022 Chronic systolic (congestive) heart failure: Secondary | ICD-10-CM

## 2024-01-12 DIAGNOSIS — F17210 Nicotine dependence, cigarettes, uncomplicated: Secondary | ICD-10-CM | POA: Diagnosis not present

## 2024-01-12 DIAGNOSIS — L84 Corns and callosities: Secondary | ICD-10-CM

## 2024-01-12 DIAGNOSIS — F410 Panic disorder [episodic paroxysmal anxiety] without agoraphobia: Secondary | ICD-10-CM

## 2024-01-12 DIAGNOSIS — F1120 Opioid dependence, uncomplicated: Secondary | ICD-10-CM

## 2024-01-12 DIAGNOSIS — J4489 Other specified chronic obstructive pulmonary disease: Secondary | ICD-10-CM | POA: Diagnosis not present

## 2024-01-12 DIAGNOSIS — K5909 Other constipation: Secondary | ICD-10-CM

## 2024-01-12 DIAGNOSIS — I10 Essential (primary) hypertension: Secondary | ICD-10-CM

## 2024-01-12 DIAGNOSIS — F39 Unspecified mood [affective] disorder: Secondary | ICD-10-CM

## 2024-01-12 DIAGNOSIS — F1911 Other psychoactive substance abuse, in remission: Secondary | ICD-10-CM

## 2024-01-12 DIAGNOSIS — L0291 Cutaneous abscess, unspecified: Secondary | ICD-10-CM

## 2024-01-12 NOTE — Progress Notes (Signed)
 Complete physical exam  Patient: Nathan Vasquez   DOB: August 18, 1980   43 y.o. Male  MRN: [id number] Visit Date: 01/12/2024  Today's healthcare provider: Jolynn Spencer, PA-C   Chief Complaint  Patient presents with  . Annual Exam    Last completed n/a Diet - General, healthy Exercise - none Feeling - poorly Sleeping - poorly Concerns -  bump on left eye  . Mass    Patient report bump on left appeared 01/09/24 that afternoon the progressed in size since then. Patient reports some fluids came from area this morning. Uses heat compress to help wit pain   Subjective    Nathan Vasquez is a 43 y.o. male who presents today for a complete physical exam.   Discussed the use of AI scribe software for clinical note transcription with the patient, who gave verbal consent to proceed.  History of Present Illness Nathan Vasquez is a 43 year old male with congestive heart failure and COPD who presents with a facial lesion and breathing difficulties.  He has a facial lesion that began as a pimple and has become black, swollen, and sore. He has been applying a salve and attempted to squeeze it, which provided some relief. He associates the onset with wearing a device on his foot that caused similar pimples.  He experiences shortness of breath and wheezing. He uses a nebulizer and takes albuterol  and Advair . He recalls an oxygen level of fifty during a past hospitalization.  He smokes half a pack of cigarettes daily for 26-27 years and uses marijuana daily for panic attacks. He drinks alcohol  infrequently.  He experiences constipation, using the bathroom twice a week, and manages it with a high fiber diet and Metamucil. He reports stomach pain and frequent gas.  He has panic attacks, PTSD, bipolar disorder, depression, and anxiety. He is not on medication for these conditions but is considering counseling. His father had paranoid schizophrenia.  He takes valsartan ,  montelukast , furosemide , and Jardiance . He has a penicillin allergy and is concerned about running out of Advair  and albuterol .    Last depression screening scores    05/30/2023    1:17 PM 12/31/2021    2:31 PM 08/16/2021    3:38 PM  PHQ 2/9 Scores  PHQ - 2 Score 2 3 5   PHQ- 9 Score 11 11 19    Last fall risk screening    05/30/2023    1:17 PM  Fall Risk   Falls in the past year? 0  Number falls in past yr: 0  Injury with Fall? 0   Last Audit-C alcohol  use screening    01/12/2024    3:46 PM  Alcohol  Use Disorder Test (AUDIT)  1. How often do you have a drink containing alcohol ? 1  2. How many drinks containing alcohol  do you have on a typical day when you are drinking? 0  3. How often do you have six or more drinks on one occasion? 0  AUDIT-C Score 1      Patient-reported   A score of 3 or more in women, and 4 or more in men indicates increased risk for alcohol  abuse, EXCEPT if all of the points are from question 1   Past Medical History:  Diagnosis Date  . (HFpEF) heart failure with preserved ejection fraction (HCC)    a. 08/2022 Echo: EF 55-60%, no rwma, nl RV fxn.  . Asthma   . Chest pain    a.  04/2023 Cor CTA: nl cors. Ca2+= 0.  . COPD (chronic obstructive pulmonary disease) (HCC)   . Gynecomastia    a. Noted on CT 04/2023 - in setting of spironolactone  rx.  . Hep C w/o coma, chronic (HCC)   . Heroin abuse (HCC)    a. OD x 9.  . History of balanitis   . Hypertension    Past Surgical History:  Procedure Laterality Date  . TOOTH EXTRACTION     Social History   Socioeconomic History  . Marital status: Single    Spouse name: Not on file  . Number of children: 1  . Years of education: 6th grade  . Highest education level: 8th grade  Occupational History  . Occupation: NA  Tobacco Use  . Smoking status: Every Day    Current packs/day: 0.50    Average packs/day: 0.5 packs/day for 26.0 years (13.0 ttl pk-yrs)    Types: Cigarettes  . Smokeless tobacco: Current   Vaping Use  . Vaping status: Former  . Quit date: 12/21/2019  Substance and Sexual Activity  . Alcohol  use: Not Currently    Comment: seldom, once every 3-6 months  . Drug use: Not Currently    Frequency: 7.0 times per week    Types: Heroin, Marijuana, Cocaine    Comment: last use 07/24/2023 heroin/Marijuana use 07/24/2023  . Sexual activity: Yes    Partners: Female  Other Topics Concern  . Not on file  Social History Narrative  . Not on file   Social Drivers of Health   Financial Resource Strain: High Risk (01/12/2024)   Overall Financial Resource Strain (CARDIA)   . Difficulty of Paying Living Expenses: Very hard  Food Insecurity: Food Insecurity Present (01/12/2024)   Hunger Vital Sign   . Worried About Programme researcher, broadcasting/film/video in the Last Year: Often true   . Ran Out of Food in the Last Year: Sometimes true  Transportation Needs: Unmet Transportation Needs (01/12/2024)   PRAPARE - Transportation   . Lack of Transportation (Medical): Yes   . Lack of Transportation (Non-Medical): Yes  Physical Activity: Inactive (01/12/2024)   Exercise Vital Sign   . Days of Exercise per Week: 0 days   . Minutes of Exercise per Session: Not on file  Stress: Stress Concern Present (01/12/2024)   [employer] of Occupational Health - Occupational Stress Questionnaire   . Feeling of Stress: To some extent  Social Connections: Socially Isolated (01/12/2024)   Social Connection and Isolation Panel   . Frequency of Communication with Friends and Family: Never   . Frequency of Social Gatherings with Friends and Family: Never   . Attends Religious Services: Never   . Active Member of Clubs or Organizations: No   . Attends Banker Meetings: Not on file   . Marital Status: Never married  Intimate Partner Violence: Not At Risk (04/10/2022)   Humiliation, Afraid, Rape, and Kick questionnaire   . Fear of Current or Ex-Partner: No   . Emotionally Abused: No   . Physically Abused: No   .  Sexually Abused: No   Family Status  Relation Name Status  . Mother  Alive  . Father  Deceased  . MGM  Deceased  . MGF  Deceased  . PGM  Deceased  . PGF  Deceased  No partnership data on file   Family History  Problem Relation Age of Onset  . COPD Mother   . Diabetes Mother   . Heart disease Mother   .  Anxiety disorder Mother   . Stroke Father 58  . Schizophrenia Father   . Diabetes Maternal Grandmother   . Diabetes Maternal Grandfather   . Cancer Maternal Grandfather   . Diabetes Paternal Grandmother   . Diabetes Paternal Grandfather   . Heart attack Paternal Grandfather    Allergies  Allergen Reactions  . Carvedilol      Extreme fatigue  . Penicillins Rash    Patient Care Team: Joda Braatz, PA-C as PCP - General (Physician Assistant) End, Lonni, MD as PCP - Cardiology (Cardiology)   Medications: Outpatient Medications Prior to Visit  Medication Sig  . albuterol  (PROVENTIL ) (2.5 MG/3ML) 0.083% nebulizer solution Take 2.5 mg by nebulization every 6 (six) hours as needed for wheezing or shortness of breath.  . albuterol  (VENTOLIN  HFA) 108 (90 Base) MCG/ACT inhaler Inhale 1-2 puffs into the lungs every 6 (six) hours as needed for wheezing or shortness of breath.  . amLODipine  (NORVASC ) 5 MG tablet Take 1 tablet (5 mg total) by mouth daily.  . empagliflozin  (JARDIANCE ) 10 MG TABS tablet Take 1 tablet (10 mg total) by mouth daily before breakfast.  . fluticasone -salmeterol (ADVAIR  DISKUS) 500-50 MCG/ACT AEPB Inhale 1 puff into the lungs in the morning and at bedtime.  . furosemide  (LASIX ) 20 MG tablet Take 1 tablet (20 mg total) by mouth daily as needed for edema or fluid.  . METHADONE  HCL PO Take 120 mg by mouth daily. (Patient taking differently: Take 150 mg by mouth daily.)  . montelukast  (SINGULAIR ) 10 MG tablet Take 1 tablet (10 mg total) by mouth once nightly at bedtime.  . nystatin  (MYCOSTATIN ) 100000 UNIT/ML suspension Take 5 mLs by mouth 4 (four) times  daily.  . spironolactone  (ALDACTONE ) 25 MG tablet Take 1 tablet (25 mg total) by mouth daily.  . valsartan  (DIOVAN ) 160 MG tablet Take 1 tablet (160 mg total) by mouth daily.   No facility-administered medications prior to visit.    Review of Systems  All other systems reviewed and are negative.  Except see HPI  {Insert previous labs (optional):23779} {See past labs  Heme  Chem  Endocrine  Serology  Results Review (optional):1}  Objective    BP 124/81 (BP Location: Left Arm, Patient Position: Sitting, Cuff Size: Normal)   Pulse 82   Ht 6' (1.829 m)   Wt 181 lb 12.8 oz (82.5 kg)   SpO2 97%   BMI 24.66 kg/m  {Insert last BP/Wt (optional):23777}{See vitals history (optional):1}    Physical Exam Vitals reviewed.  Constitutional:      General: He is not in acute distress.    Appearance: Normal appearance. He is not diaphoretic.  HENT:     Head: Normocephalic and atraumatic.  Eyes:     General: No scleral icterus.    Conjunctiva/sclera: Conjunctivae normal.  Cardiovascular:     Rate and Rhythm: Normal rate and regular rhythm.     Pulses: Normal pulses.     Heart sounds: Normal heart sounds. No murmur heard. Pulmonary:     Effort: Pulmonary effort is normal. No respiratory distress.     Breath sounds: Normal breath sounds. No wheezing or rhonchi.  Musculoskeletal:     Cervical back: Neck supple.     Right lower leg: No edema.     Left lower leg: No edema.  Lymphadenopathy:     Cervical: No cervical adenopathy.  Skin:    General: Skin is warm and dry.     Findings: No rash.  Neurological:  Mental Status: He is alert and oriented to person, place, and time. Mental status is at baseline.  Psychiatric:        Mood and Affect: Mood normal.        Behavior: Behavior normal.      No results found for any visits on 01/12/24.  Assessment & Plan    Routine Health Maintenance and Physical Exam  Exercise Activities and Dietary recommendations  Goals   None      Immunization History  Administered Date(s) Administered  . Hepb-cpg 08/23/2021  . Moderna Sars-Covid-2 Vaccination 02/22/2021  . PFIZER(Purple Top)SARS-COV-2 Vaccination 09/25/2019, 02/09/2020  . Td 09/05/2000    Health Maintenance  Topic Date Due  . Pneumococcal Vaccine (1 of 2 - PCV) Never done  . HPV Vaccine (1 - 3-dose SCDM series) Never done  . DTaP/Tdap/Td vaccine (2 - Tdap) 09/06/2010  . Hepatitis B Vaccine (2 of 2 - CpG 2-dose series) 09/20/2021  . COVID-19 Vaccine (4 - 2024-25 season) 01/26/2023  . Flu Shot  12/26/2023  . Hepatitis C Screening  Completed  . HIV Screening  Completed  . Meningitis B Vaccine  Aged Out    Discussed health benefits of physical activity, and encouraged him to engage in regular exercise appropriate for his age and condition.  Assessment & Plan Left foot skin infection/abscess Left foot skin infection/abscess exacerbated by foot device. Congestive heart failure may impair infection resistance. Allergic to penicillin. - Refer to podiatry for evaluation. - Advise to report worsening infection for potential surgical intervention.  Congestive heart failure Contributes to breathing difficulties and potential infection susceptibility. No current leg swelling. - Continue valsartan  and furosemide . - Advise to monitor for leg swelling and contact healthcare provider if it occurs.  Chronic obstructive pulmonary disease (COPD) COPD with shortness of breath and wheezing. Requires reassessment with PFTs. - Refer to pulmonology for PFTs and further assessment. - Continue albuterol  and Advair  inhalers. - Discuss nebulizer need with pulmonologist.  Constipation Chronic with infrequent bowel movements, possibly due to low fiber intake. - Advise high fiber diet and increased water intake. - Recommend fiber supplements like Metamucil. Will follow-up  Panic attacks Panic attacks related to breathing difficulties. History of PTSD and bipolar  disorder. Finds counseling unhelpful. - Discuss starting counseling or therapy sessions. - Consider referral to behavioral health services.  Electrolyte imbalance Electrolyte imbalance possibly due to dehydration from medications. - Advise adequate hydration. - Monitor electrolytes regularly.     ***  No follow-ups on file.    The patient was advised to call back or seek an in-person evaluation if the symptoms worsen or if the condition fails to improve as anticipated.  I discussed the assessment and treatment plan with the patient. The patient was provided an opportunity to ask questions and all were answered. The patient agreed with the plan and demonstrated an understanding of the instructions.  I, Willamae Demby, PA-C have reviewed all documentation for this visit. The documentation on 01/12/2024  for the exam, diagnosis, procedures, and orders are all accurate and complete.  Jolynn Spencer, [facility], MMS [facility] [phone] (phone) [phone] (fax)  [facility]

## 2024-01-13 ENCOUNTER — Other Ambulatory Visit: Payer: Self-pay

## 2024-01-13 MED ORDER — DOXYCYCLINE HYCLATE 100 MG PO TABS
100.0000 mg | ORAL_TABLET | Freq: Two times a day (BID) | ORAL | 0 refills | Status: DC
  Filled 2024-01-13: qty 14, 7d supply, fill #0

## 2024-01-14 DIAGNOSIS — L0291 Cutaneous abscess, unspecified: Secondary | ICD-10-CM | POA: Insufficient documentation

## 2024-01-14 DIAGNOSIS — K5909 Other constipation: Secondary | ICD-10-CM | POA: Insufficient documentation

## 2024-01-15 ENCOUNTER — Ambulatory Visit: Payer: MEDICAID | Admitting: Physician Assistant

## 2024-01-20 ENCOUNTER — Other Ambulatory Visit: Payer: Self-pay | Admitting: Physician Assistant

## 2024-01-20 ENCOUNTER — Other Ambulatory Visit: Payer: Self-pay

## 2024-01-20 DIAGNOSIS — I10 Essential (primary) hypertension: Secondary | ICD-10-CM

## 2024-01-20 DIAGNOSIS — I5022 Chronic systolic (congestive) heart failure: Secondary | ICD-10-CM

## 2024-01-20 MED ORDER — FUROSEMIDE 20 MG PO TABS
20.0000 mg | ORAL_TABLET | Freq: Every day | ORAL | 0 refills | Status: AC | PRN
  Filled 2024-01-20: qty 30, 30d supply, fill #0

## 2024-01-21 ENCOUNTER — Ambulatory Visit (INDEPENDENT_AMBULATORY_CARE_PROVIDER_SITE_OTHER): Payer: MEDICAID | Admitting: Podiatry

## 2024-01-21 ENCOUNTER — Ambulatory Visit: Payer: MEDICAID | Admitting: Physician Assistant

## 2024-01-21 DIAGNOSIS — D2372 Other benign neoplasm of skin of left lower limb, including hip: Secondary | ICD-10-CM | POA: Diagnosis not present

## 2024-01-25 NOTE — Progress Notes (Signed)
  Subjective:  Patient ID: Nathan Vasquez, male    DOB: 08-17-1980,  MRN: [id number]  No chief complaint on file.   43 y.o. male presents with the above complaint. History confirmed with patient.   Objective:  Physical Exam: warm, good capillary refill, no trophic changes or ulcerative lesions, normal DP and PT pulses, normal sensory exam, and plantar forefoot verruca plantaris  Assessment:   1. Benign neoplasm of skin of left foot      Plan:  Patient was evaluated and treated and all questions answered.  Discussed etiology and treatment of verruca plantaris in detail with the patient as well as multiple treatment options including blistering agents, chemotherapeutic agents, surgical excision, laser therapy and the indications and roles of the above.  Today, recommended treatment with Cantharone as noted in procedure note below.     Procedure: Destruction of Lesion Location: left foot Instrumentation: 15 blade. Technique: Debridement of lesion to petechial bleeding. Aperture pad applied around lesion. Small amount of canthrone applied to the base of the lesion. Dressing: Dry, sterile, compression dressing. Disposition: Patient tolerated procedure well. Advised to leave dressing on for 12-24 hours. Thereafter patient to wash the area with soap and water and applied band-aid.    No follow-ups on file.

## 2024-02-05 ENCOUNTER — Encounter: Payer: Self-pay | Admitting: Pulmonary Disease

## 2024-02-05 ENCOUNTER — Other Ambulatory Visit: Payer: Self-pay

## 2024-02-05 ENCOUNTER — Ambulatory Visit: Payer: MEDICAID | Admitting: Pulmonary Disease

## 2024-02-05 VITALS — BP 124/72 | HR 86 | Temp 97.5°F | Ht 72.0 in | Wt 186.8 lb

## 2024-02-05 DIAGNOSIS — F17210 Nicotine dependence, cigarettes, uncomplicated: Secondary | ICD-10-CM

## 2024-02-05 DIAGNOSIS — R0602 Shortness of breath: Secondary | ICD-10-CM

## 2024-02-05 LAB — NITRIC OXIDE: Nitric Oxide: 13

## 2024-02-05 MED ORDER — PREDNISONE 20 MG PO TABS
40.0000 mg | ORAL_TABLET | Freq: Every day | ORAL | 0 refills | Status: AC
  Filled 2024-02-05: qty 10, 5d supply, fill #0

## 2024-02-05 MED ORDER — TRELEGY ELLIPTA 200-62.5-25 MCG/ACT IN AEPB
1.0000 | INHALATION_SPRAY | Freq: Every day | RESPIRATORY_TRACT | 6 refills | Status: DC
  Filled 2024-02-05 – 2024-02-11 (×3): qty 180, 90d supply, fill #0

## 2024-02-06 ENCOUNTER — Other Ambulatory Visit: Payer: Self-pay

## 2024-02-06 NOTE — Progress Notes (Signed)
 Synopsis: Referred in by Ostwalt, Janna, PA-C   Subjective:   PATIENT ID: Nathan Vasquez GENDER: male DOB: 1980-08-28, MRN: [id number]  Chief Complaint  Patient presents with   Asthma    SOB. Wheezing. Cough. For years.     HPI Nathan Vasquez is a 43 years old male patient with a past medical history of asthma and a history of polysubstance abuse presenting today to the pulmonary clinic for ongoing shorness of breath.   He reports shortness of breath for years that is progressively worsening associated with chest tightness and wheezing.   He was supposed to have PFTs, Allergen panel and CBC with differential done last year but he never had them done.   He is not on any maintenance therapy. Uses albuterol  as needed without much help.   FENO in office 13 indicating lack of intrapulmonary eosinophilic inflammation.     Family History  Problem Relation Age of Onset   COPD Mother    Diabetes Mother    Heart disease Mother    Anxiety disorder Mother    Stroke Father 50   Schizophrenia Father    Diabetes Maternal Grandmother    Diabetes Maternal Grandfather    Cancer Maternal Grandfather    Diabetes Paternal Grandmother    Diabetes Paternal Grandfather    Heart attack Paternal Grandfather      Social History   Socioeconomic History   Marital status: Single    Spouse name: Not on file   Number of children: 1   Years of education: 6th grade   Highest education level: 8th grade  Occupational History   Occupation: NA  Tobacco Use   Smoking status: Every Day    Current packs/day: 0.50    Average packs/day: 0.5 packs/day for 26.0 years (13.0 ttl pk-yrs)    Types: Cigarettes   Smokeless tobacco: Current   Tobacco comments:    Smokes 0.5 PPD- khj 02/05/2024  Vaping Use   Vaping status: Former   Quit date: 12/21/2019  Substance and Sexual Activity   Alcohol  use: Not Currently    Comment: seldom, once every 3-6 months   Drug use: Not Currently    Frequency: 7.0  times per week    Types: Heroin, Marijuana, Cocaine    Comment: last use 07/24/2023 heroin/Marijuana use 07/24/2023   Sexual activity: Yes    Partners: Female  Other Topics Concern   Not on file  Social History Narrative   Not on file   Social Drivers of Health   Financial Resource Strain: High Risk (01/12/2024)   Overall Financial Resource Strain (CARDIA)    Difficulty of Paying Living Expenses: Very hard  Food Insecurity: Food Insecurity Present (01/12/2024)   Hunger Vital Sign    Worried About Running Out of Food in the Last Year: Often true    Ran Out of Food in the Last Year: Sometimes true  Transportation Needs: Unmet Transportation Needs (01/12/2024)   PRAPARE - Transportation    Lack of Transportation (Medical): Yes    Lack of Transportation (Non-Medical): Yes  Physical Activity: Inactive (01/12/2024)   Exercise Vital Sign    Days of Exercise per Week: 0 days    Minutes of Exercise per Session: Not on file  Stress: Stress Concern Present (01/12/2024)   [employer] of Occupational Health - Occupational Stress Questionnaire    Feeling of Stress: To some extent  Social Connections: Socially Isolated (01/12/2024)   Social Connection and Isolation Panel    Frequency of Communication  with Friends and Family: Never    Frequency of Social Gatherings with Friends and Family: Never    Attends Religious Services: Never    Database administrator or Organizations: No    Attends Engineer, structural: Not on file    Marital Status: Never married  Intimate Partner Violence: Not At Risk (04/10/2022)   Humiliation, Afraid, Rape, and Kick questionnaire    Fear of Current or Ex-Partner: No    Emotionally Abused: No    Physically Abused: No    Sexually Abused: No        Objective:   Vitals:   02/05/24 1424  BP: 124/72  Pulse: 86  Temp: (!) 97.5 F (36.4 C)  SpO2: 94%  Weight: 186 lb 12.8 oz (84.7 kg)  Height: 6' (1.829 m)   94% on  RA BMI Readings from Last 3  Encounters:  02/05/24 25.33 kg/m  01/12/24 24.66 kg/m  12/11/23 24.25 kg/m   Wt Readings from Last 3 Encounters:  02/05/24 186 lb 12.8 oz (84.7 kg)  01/12/24 181 lb 12.8 oz (82.5 kg)  12/11/23 178 lb 12.8 oz (81.1 kg)    Physical Exam GEN: NAD, Healthy Appearing HEENT: Supple Neck, Reactive Pupils, EOMI  CVS: Normal S1, Normal S2, RRR, No murmurs or ES appreciated  Lungs: Bilateral expiratory wheezing appreciated.  Abdomen: Soft, non tender, non distended, + BS  Extremities: Warm and well perfused, No edema   Labs and imaging were reviewed.  Ancillary Information   CBC    Component Value Date/Time   WBC 4.6 01/10/2023 1447   WBC 13.7 (H) 09/24/2022 0559   RBC 5.65 01/10/2023 1447   RBC 4.86 09/24/2022 0559   HGB 16.1 01/10/2023 1447   HCT 49.9 01/10/2023 1447   PLT 171 01/10/2023 1447   MCV 88 01/10/2023 1447   MCV 93 09/06/2014 0436   MCH 28.5 01/10/2023 1447   MCH 27.8 09/24/2022 0559   MCHC 32.3 01/10/2023 1447   MCHC 31.7 09/24/2022 0559   RDW 13.8 01/10/2023 1447   RDW 14.1 09/06/2014 0436   LYMPHSABS 1.7 01/10/2023 1447   LYMPHSABS 0.5 (L) 09/06/2014 0436   MONOABS 0.5 09/22/2022 1316   MONOABS 0.3 09/06/2014 0436   EOSABS 0.1 01/10/2023 1447   EOSABS 0.0 09/06/2014 0436   BASOSABS 0.1 01/10/2023 1447   BASOSABS 0.0 09/06/2014 0436        No data to display           Assessment & Plan:  Nathan Vasquez is a 43 years old male patient with a past medical history of asthma and a history of polysubstance abuse presenting today to the pulmonary clinic for ongoing shorness of breath.   His shortness of breath is possibly from progression of his underlying asthma, ongoing smoking and non compliance with medical management.   I will prescribe him a short term prednisone  course and start him on Trelegy Ellipta . I will reorder PFTs and he will follow up with Dr. Isadora in 4 weeks.   Return in about 4 weeks (around 03/04/2024).  I personally spent a total  of 60 minutes in the care of the patient today including preparing to see the patient, getting/reviewing separately obtained history, performing a medically appropriate exam/evaluation, counseling and educating, placing orders, documenting clinical information in the EHR, and independently interpreting results.   Darrin Barn, MD [facility] 02/06/2024 1:24 PM

## 2024-02-09 ENCOUNTER — Other Ambulatory Visit: Payer: Self-pay

## 2024-02-10 ENCOUNTER — Other Ambulatory Visit (HOSPITAL_COMMUNITY): Payer: Self-pay

## 2024-02-10 ENCOUNTER — Telehealth: Payer: Self-pay

## 2024-02-10 NOTE — Telephone Encounter (Signed)
 Pharmacy Patient Advocate Encounter   Received notification from Fax that prior authorization for Trelegy Ellipta  200-62.5-25MCG/ACT aerosol powder   is required/requested.   Insurance verification completed.   The patient is insured through [employer] .   Per test claim: PA required; PA submitted to above mentioned insurance via Latent Key/confirmation #/EOC [id number] Status is pending

## 2024-02-11 ENCOUNTER — Other Ambulatory Visit: Payer: Self-pay

## 2024-02-11 NOTE — Telephone Encounter (Signed)
 Your request has been approved Authorization Expiration09/17/2026

## 2024-02-11 NOTE — Telephone Encounter (Signed)
 Pharmacy advised that PA has been approved. Patient advised. NFN.

## 2024-02-12 ENCOUNTER — Other Ambulatory Visit: Payer: Self-pay

## 2024-02-23 ENCOUNTER — Ambulatory Visit: Payer: MEDICAID | Admitting: Nurse Practitioner

## 2024-03-15 ENCOUNTER — Ambulatory Visit: Payer: MEDICAID

## 2024-03-15 DIAGNOSIS — R0602 Shortness of breath: Secondary | ICD-10-CM | POA: Diagnosis not present

## 2024-03-15 LAB — PULMONARY FUNCTION TEST
DL/VA % pred: 87 %
DL/VA: 4.02 ml/min/mmHg/L
DLCO unc % pred: 50 %
DLCO unc: 16.57 ml/min/mmHg
FEF 25-75 Post: 0.57 L/s
FEF 25-75 Pre: 0.33 L/s
FEF2575-%Change-Post: 70 %
FEF2575-%Pred-Post: 13 %
FEF2575-%Pred-Pre: 8 %
FEV1-%Change-Post: 26 %
FEV1-%Pred-Post: 23 %
FEV1-%Pred-Pre: 18 %
FEV1-Post: 1.05 L
FEV1-Pre: 0.83 L
FEV1FVC-%Change-Post: 0 %
FEV1FVC-%Pred-Pre: 51 %
FEV6-%Change-Post: 25 %
FEV6-%Pred-Post: 44 %
FEV6-%Pred-Pre: 35 %
FEV6-Post: 2.45 L
FEV6-Pre: 1.95 L
FEV6FVC-%Change-Post: 0 %
FEV6FVC-%Pred-Post: 99 %
FEV6FVC-%Pred-Pre: 99 %
FVC-%Change-Post: 25 %
FVC-%Pred-Post: 45 %
FVC-%Pred-Pre: 36 %
FVC-Post: 2.52 L
FVC-Pre: 2.02 L
Post FEV1/FVC ratio: 41 %
Post FEV6/FVC ratio: 97 %
Pre FEV1/FVC ratio: 41 %
Pre FEV6/FVC Ratio: 97 %
RV % pred: 325 %
RV: 6.44 L
TLC % pred: 120 %
TLC: 8.85 L

## 2024-03-15 NOTE — Patient Instructions (Signed)
 Full PFT completed today ? ?

## 2024-03-15 NOTE — Progress Notes (Signed)
 Full PFT completed today ? ?

## 2024-03-18 ENCOUNTER — Encounter: Payer: Self-pay | Admitting: Student in an Organized Health Care Education/Training Program

## 2024-03-18 ENCOUNTER — Other Ambulatory Visit
Admission: RE | Admit: 2024-03-18 | Discharge: 2024-03-18 | Disposition: A | Discharge status: Home / Self Care | Payer: MEDICAID | Attending: Student in an Organized Health Care Education/Training Program | Admitting: Student in an Organized Health Care Education/Training Program

## 2024-03-18 ENCOUNTER — Ambulatory Visit (INDEPENDENT_AMBULATORY_CARE_PROVIDER_SITE_OTHER): Payer: MEDICAID | Admitting: Student in an Organized Health Care Education/Training Program

## 2024-03-18 ENCOUNTER — Other Ambulatory Visit: Payer: Self-pay

## 2024-03-18 VITALS — BP 118/74 | HR 91 | Temp 97.1°F | Ht 72.0 in | Wt 190.6 lb

## 2024-03-18 DIAGNOSIS — J4550 Severe persistent asthma, uncomplicated: Secondary | ICD-10-CM | POA: Insufficient documentation

## 2024-03-18 DIAGNOSIS — J42 Unspecified chronic bronchitis: Secondary | ICD-10-CM | POA: Diagnosis present

## 2024-03-18 DIAGNOSIS — R0602 Shortness of breath: Secondary | ICD-10-CM

## 2024-03-18 LAB — CBC WITH DIFFERENTIAL/PLATELET
Abs Immature Granulocytes: 0.01 K/uL (ref 0.00–0.07)
Basophils Absolute: 0.1 K/uL (ref 0.0–0.1)
Basophils Relative: 1 %
Eosinophils Absolute: 0.2 K/uL (ref 0.0–0.5)
Eosinophils Relative: 3 %
HCT: 50.6 % (ref 39.0–52.0)
Hemoglobin: 16.3 g/dL (ref 13.0–17.0)
Immature Granulocytes: 0 %
Lymphocytes Relative: 27 %
Lymphs Abs: 1.7 K/uL (ref 0.7–4.0)
MCH: 30.2 pg (ref 26.0–34.0)
MCHC: 32.2 g/dL (ref 30.0–36.0)
MCV: 93.9 fL (ref 80.0–100.0)
Monocytes Absolute: 0.4 K/uL (ref 0.1–1.0)
Monocytes Relative: 7 %
Neutro Abs: 4 K/uL (ref 1.7–7.7)
Neutrophils Relative %: 62 %
Platelets: 171 K/uL (ref 150–400)
RBC: 5.39 MIL/uL (ref 4.22–5.81)
RDW: 12 % (ref 11.5–15.5)
WBC: 6.4 K/uL (ref 4.0–10.5)
nRBC: 0 % (ref 0.0–0.2)

## 2024-03-18 MED ORDER — TRELEGY ELLIPTA 200-62.5-25 MCG/ACT IN AEPB
1.0000 | INHALATION_SPRAY | Freq: Every day | RESPIRATORY_TRACT | 12 refills | Status: AC
  Filled 2024-03-18 – 2024-04-25 (×2): qty 180, 90d supply, fill #0

## 2024-03-18 NOTE — Patient Instructions (Addendum)
 VISIT SUMMARY: During your visit, we discussed your asthma, congestive heart failure, and tobacco use. You have been experiencing increased shortness of breath, wheezing, and a cough, which worsened after a recent cold. We reviewed your current medications and made some adjustments to help manage your symptoms better.  YOUR PLAN: -ASTHMA WITH PERSISTENT AIRFLOW OBSTRUCTION: Asthma is a condition where your airways become inflamed and narrowed, making it hard to breathe. Your asthma has been worsened by a recent cold. Continue using Trelegy Ellipta  once daily, and remember to wash your mouth after each use to prevent thrush. We will reorder blood work for an allergen panel to identify any triggers.  -TOBACCO USE DISORDER: Smoking can worsen your respiratory health. You are currently smoking about half a pack per day and are trying to quit. We discussed the importance of quitting and potential use of nicotine  lozenges, although you prefer to quit without assistance.   INSTRUCTIONS: Please continue using Trelegy Ellipta  once daily and wash your mouth after each use. We will reorder blood work for an allergen panel. Try to reduce smoking and consider nicotine  lozenges if needed. Continue taking Lasix  as needed for leg swelling. Follow up with us  if your symptoms worsen or if you have any concerns.    The [place]  Quitline: Call 1-800-QUIT-NOW (918-710-5908). The [place] Quitline is a free service for [place]  residents. Trained counselors are available from 8 am until 3 am, 365 days per year. Services are available in both [place] and [place].   Web Resources Free online support programs can help you track your progress and share experiences with others who are quitting. These are examples: www.becomeanex.org www.trytostop.org  www.smokefree.gov  www.https://www.vargas.com/.aspx  [facility]: offers comprehensive in-person tobacco treatment  counseling at [facility] building ([address] [place] [address]).  Open to everyone. Virtual appointments available. Free parking. Call [phone] to schedule an appointment or [phone] for general information.    Tobacco Cessation Medications  Nicotine  Replacement Therapy (NRT)  Nicotine  is the addictive part of tobacco smoke, but not the most dangerous part. There are 7000 other toxins in cigarettes, including carbon monoxide, that cause disease. People do not generally become addicted to medication. Common problems: People don't use enough medication or stop too early. Medications are safe and effective. Overdose is very uncommon. Use medications as long as needed (3 months minimum). Some combinations work better than single medications. Long acting medications like the NRT patch and bupropion provide continuous treatment for withdrawal symptoms.  PLUS  Short acting medications like the NRT gum, lozenge, inhaler, and nasal spray help people to cope with breakthrough cravings.  ? Nicotine  Patch  Place patch on hairless skin on upper body, including arms and back. Each day: discard old patch, shower, apply new patch to a different site. Apply hydrocortisone cream to mildly red/irritated areas. Call provider if rash develops. If patch causes sleep disturbance, remove patch at bedtime and replace each morning after shower. Side effects may include: skin irritation, headache, insomnia, abnormal/vivid dreams.  ? Nicotine  Gum  Chew gum slowly, park in cheek when peppery taste or tingling sensation begins (about 15-30 chews). When taste or tingling goes away, begin chewing again. Use until nicotine  is gone (taste or tingle does not return, usually 30 minutes). Park in different areas of mouth. Nicotine  is absorbed through the lining of the mouth. Use enough to control cravings, up to 24 pieces per day (if used alone). Avoid eating or drinking for 15 minutes before  using and during use. Side effects may include: mouth/jaw soreness, hiccups, indigestion, hypersalivation.  If gum is not chewed correctly, additional side effects may include lightheadedness, nausea/vomiting, throat and mouth irritation.  ? Nicotine  Lozenge  Allow to dissolve slowly in mouth (20-30 minutes). Do not chew or swallow. Nicotine  release may cause a warm tingling sensation. Occasionally rotate to different areas of the mouth. Use enough to control cravings, up to 20 lozenges per day (if used alone). Avoid eating or drinking for 15 minutes before using and during use. Side effects may include: nausea, hiccups, cough, heartburn, headache, gas, insomnia.  ? Nicotine  Nasal Spray Use 1 spray in each nostril (1 dose) and tilt head back for 1 minute. Do not sniff, swallow, or inhale through nose.  Use at least 8 doses (1 spray in each nostril) , up to 40 doses per day (if used alone). To reduce nasal irritation, spray on cotton swab and insert into nose. Side effects may include: nasal and/or throat irritation (hot, peppery, or burning sensation), nasal irritation, tearing, sneezing, cough, headache.  ? Nicotine  Oral Inhaler (puffer) Inhale into the back of the throat or puff in short breaths. Do not inhale into the lungs.  Puff continuously for 20 minutes (about 80 puffs) until cartridge is empty. Change cartridge when it loses the "burning in throat" sensation (feels like air only). Open cartridges can be saved and used again within 24 hours. Use at least 6 and up to 16 cartridges per day (if used alone).  Avoid eating or drinking for 15 minutes before using and during use. Side effects may include: mouth and/or throat irritation, unpleasant taste, cough, nasal irritation, indigestion, hiccups, headache.  ? Chantix (varenicline) Days 1-3: Take one 0.5 mg white pill each morning for 3 days, one week before quit date. Days 4-7: Increase to one 0.5 mg white pill twice a day in  morning and evening for 4 days.  On Day 8 (target quit date), increase to one 1 mg blue pill twice a day. Maintain this dose for a minimum of 3 months. Take with food and a full glass of water to reduce nausea. Be sure that the two doses are at least 8 hours apart, but try to take second dose early in the evening (i.e. 6 pm) to avoid sleep problems. Common side effects include: nausea, insomnia, headache, abnormal/vivid dreams. Tell your doctor if you have any history of psychiatric illness prior to starting Chantix.  STOP taking CHANTIX and contact a healthcare provider immediately if you experience agitation, hostility, depressed mood, changes in thoughts or behavior that are not typical for you, thinking about or attempting suicide, allergic or skin reactions including swelling, rash, redness, or peeling of the skin.  For patients who have heart disease: Smoking is a major risk factor for cardiovascular disease, and Chantix can help you quit smoking. Chantix may be associated with a small, increased risk of certain heart events in patients who have heart disease. If you have any new or worsening symptoms of heart disease while taking Chantix, such as shortness of breath or trouble breathing, new or worsening chest pain, or new or worsening pain in your legs when walking, call your doctor or get emergency medical help immediately.  ? Wellbutrin / Zyban (bupropion) Take one 150 mg pill each morning for 3 days, one week before target quit date. On Day 4, increase to one 150 mg pill twice a day, morning and evening.  Maintain this dose for a minimum of 3  months. Be sure that the two doses are at least 8 hours apart, but try to take second dose early in the evening (i.e. 6 pm) to avoid sleep problems. Avoid or minimize use of alcohol  when taking this medication. Common side effects include: dry mouth, headache, insomnia, nausea, weight loss.  Risk of seizure is 05/998. STOP taking BUPROPION and  contact a healthcare provider immediately if you experience agitation, hostility, depressed mood, changes in thoughts or behavior that are not typical for you, thinking about or attempting suicide, allergic or skin reactions including swelling, rash, redness, or peeling of the skin.

## 2024-03-18 NOTE — Progress Notes (Signed)
 Assessment & Plan:   #Shortness of breath #Chronic bronchitis  #COPD #Severe Persistent Asthma  His respiratory symptoms are likely due to underlying asthma, with potential remodeling and obstruction in the form of COPD. PFT's showed very severe obstruction, with significant reversibility. 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  Started on Trelegy with good response. Symptoms now exacerbated by a recent upper respiratory infection. Trelegy Ellipta  provides longer-lasting relief than Advair  so we will continue this for now. He reports wheezing and cough with clear to speckled phlegm, though no wheezing is heard on auscultation. The rescue inhaler is used once daily with some relief.   Continue Trelegy Ellipta , one puff daily. Reorder blood work for an allergen panel to assess for t-helper cell type two response, and would consider biologics in the future if blood work is suggestive of it. Instruct him to wash his mouth after each use of Trelegy to prevent thrush.  - Fluticasone -Umeclidin-Vilant (TRELEGY ELLIPTA ) 200-62.5-25 MCG/ACT AEPB; Inhale 1 puff into the lungs daily.  Dispense: 180 each; Refill: 12 - Allergen Panel (27) + IGE; Future - CBC with Differential/Platelet; Future  #Tobacco use disorder    He continues to smoke about half a pack per day and acknowledges the importance of quitting to improve respiratory health. He prefers to quit without nicotine  replacement therapy. Encourage smoking cessation and discuss potential use of nicotine  lozenges, though he prefers to quit without assistance.   Return in about 6 months (around 09/16/2024).  Belva November, MD [facility]  I spent 30 minutes caring for this patient today, including preparing to see the patient, obtaining a medical history , reviewing a separately obtained history, performing a medically  appropriate examination and/or evaluation, counseling and educating the patient/family/caregiver, ordering medications, tests, or procedures, documenting clinical information in the electronic health record, and independently interpreting results (not separately reported/billed) and communicating results to the patient/family/caregiver  End of visit medications:  Meds ordered this encounter  Medications   Fluticasone -Umeclidin-Vilant (TRELEGY ELLIPTA ) 200-62.5-25 MCG/ACT AEPB    Sig: Inhale 1 puff into the lungs daily.    Dispense:  180 each    Refill:  12     Current Outpatient Medications:    albuterol  (VENTOLIN  HFA) 108 (90 Base) MCG/ACT inhaler, Inhale 1-2 puffs into the lungs every 6 (six) hours as needed for wheezing or shortness of breath., Disp: 36 g, Rfl: 3   amLODipine  (NORVASC ) 5 MG tablet, Take 1 tablet (5 mg total) by mouth daily., Disp: 90 tablet, Rfl: 4   empagliflozin  (JARDIANCE ) 10 MG TABS tablet, Take 1 tablet (10 mg total) by mouth daily before breakfast., Disp: 90 tablet, Rfl: 4   furosemide  (LASIX ) 20 MG tablet, Take 1 tablet (20 mg total) by mouth daily as needed for edema or fluid., Disp: 30 tablet, Rfl: 0   ipratropium-albuterol  (DUONEB) 0.5-2.5 (3) MG/3ML SOLN, Take 3 mLs by nebulization every 6 (six) hours as needed., Disp: , Rfl:    METHADONE  HCL PO, Take 120 mg by mouth daily. (Patient taking differently: Take 150 mg by mouth daily.), Disp: , Rfl:    montelukast  (SINGULAIR ) 10 MG tablet, Take 1 tablet (10 mg total) by mouth once nightly at bedtime., Disp: 90 tablet, Rfl: 1   spironolactone  (ALDACTONE ) 25 MG tablet, Take 1 tablet (25 mg total) by mouth daily., Disp: 90 tablet, Rfl: 3   valsartan  (DIOVAN ) 160 MG tablet, Take 1 tablet (  160 mg total) by mouth daily., Disp: 90 tablet, Rfl: 1   doxycycline  (VIBRA -TABS) 100 MG tablet, Take 1 tablet (100 mg total) by mouth 2 (two) times daily. (Patient not taking: Reported on 03/18/2024), Disp: 14 tablet, Rfl: 0    Fluticasone -Umeclidin-Vilant (TRELEGY ELLIPTA ) 200-62.5-25 MCG/ACT AEPB, Inhale 1 puff into the lungs daily., Disp: 180 each, Rfl: 12   Subjective:   PATIENT ID: Nathan Vasquez GENDER: male DOB: 05-16-1981, MRN: [id number]  Chief Complaint  Patient presents with   Asthma    DOE. Wheezing. Cough with clear sputum. Trelegy daily helps with his breathing. Duoneb- PRN. Albuterol - PRN    HPI  Discussed the use of AI scribe software for clinical note transcription with the patient, who gave verbal consent to proceed.  Nathan Vasquez is a 43 year old male with asthma and congestive heart failure who presents for follow-up of shortness of breath.  Return Visit 07/03/2022:  Patient has been having worsening shortness of breath since around March of 2023.  He has increased dyspnea on exertion, wheezing, and cough.  The cough is productive of clear sputum.  He does not have any fevers or chills, no pleurisy, no chest pain.  He does report some chest tightness that is associated with the breathing symptoms. He was given prednisone  with brief improvement in his symptoms, and has cycled through multiple inhalers. He smokes and also has significant second hand smoke exposure at home. He does not have any pets.   Today, he reports persistent symptoms. Patient has been prescribed Advair  during our previous visit that he was using but caused him thrush, causing him to stop. He was prescribed a gargle by his dentist that he will be picking up and will continue using his Advair .  Acute Visit 02/05/2024 with Dr. Malka:  He reports shortness of breath for years that is progressively worsening associated with chest tightness and wheezing.    He was supposed to have PFTs, Allergen panel and CBC with differential done last year but he never had them done.    He is not on any maintenance therapy. Uses albuterol  as needed without much help.    FENO in office 13 indicating lack of  intrapulmonary eosinophilic inflammation.   Return Visit 03/18/2024:  He has a history of asthma and was last seen by me in February 2024, at which time Advair  was prescribed, and a CT scan of the chest, allergen panel, and pulmonary function tests (PFTs) were ordered. However, the PFTs were not performed at that time. He returned in September 2025 with increased shortness of breath and was seen by Dr. Malka who ordered PFTs and started him on Trelegy Ellipta . He has been using Trelegy Ellipta  once daily in the morning, which he finds helpful. He has been on Trelegy for a little over a month and initially experienced significant improvement in symptoms, but recently developed a cold about a week ago, which exacerbated his symptoms.  He was diagnosed with congestive heart failure and is currently taking Lasix  as needed for leg swelling.  Currently, he experiences wheezing and a cough that produces a small amount of clear, occasionally speckled phlegm, but no blood in the phlegm. He uses his rescue inhaler approximately once a day, which provides some relief, especially when climbing stairs, as he lives upstairs.  He smokes about half a pack of cigarettes per day and has been attempting to quit by reducing his smoking. He has not tried any nicotine  replacement therapies.  He previously used Advair  but found it less effective than Trelegy, as it wore off quickly and caused thrush.    He continues to smoke up to 1/2 a pack aday. He has been smoking since age of 39. He has a history of smoking marijuana. He denies any other drug use at the moment.  He does have a history of smoking crack cocaine, snorting heroin, snorting cocaine, and smoking crystal meth.  He reports he has not done so for a few years. He was incarcerated for two years and released a year ago.  Ancillary information including prior medications, full medical/surgical/family/social histories, and PFTs (when available) are listed below and  have been reviewed.    Review of Systems  Constitutional:  Negative for chills, fever, malaise/fatigue and weight loss.  Respiratory:  Positive for cough, sputum production and shortness of breath. Negative for hemoptysis and wheezing.   Cardiovascular:  Negative for chest pain.     Objective:   Vitals:   03/18/24 1518  BP: 118/74  Pulse: 91  Temp: (!) 97.1 F (36.2 C)  SpO2: 95%  Weight: 190 lb 9.6 oz (86.5 kg)  Height: 6' (1.829 m)   95% on RA BMI Readings from Last 3 Encounters:  03/18/24 25.85 kg/m  03/15/24 25.93 kg/m  02/05/24 25.33 kg/m   Wt Readings from Last 3 Encounters:  03/18/24 190 lb 9.6 oz (86.5 kg)  03/15/24 191 lb 3.2 oz (86.7 kg)  02/05/24 186 lb 12.8 oz (84.7 kg)    Physical Exam Constitutional:      Appearance: Normal appearance.  Cardiovascular:     Rate and Rhythm: Normal rate and regular rhythm.     Pulses: Normal pulses.     Heart sounds: Normal heart sounds.  Pulmonary:     Effort: Pulmonary effort is normal.     Breath sounds: Normal breath sounds. No wheezing.  Neurological:     General: No focal deficit present.     Mental Status: He is alert and oriented to person, place, and time. Mental status is at baseline.       Ancillary Information    Past Medical History:  Diagnosis Date   (HFpEF) heart failure with preserved ejection fraction (HCC)    a. 08/2022 Echo: EF 55-60%, no rwma, nl RV fxn.   Asthma    Chest pain    a. 04/2023 Cor CTA: nl cors. Ca2+= 0.   COPD (chronic obstructive pulmonary disease) (HCC)    Gynecomastia    a. Noted on CT 04/2023 - in setting of spironolactone  rx.   Hep C w/o coma, chronic (HCC)    Heroin abuse (HCC)    a. OD x 9.   History of balanitis    Hypertension      Family History  Problem Relation Age of Onset   COPD Mother    Diabetes Mother    Heart disease Mother    Anxiety disorder Mother    Stroke Father 55   Schizophrenia Father    Diabetes Maternal Grandmother    Diabetes  Maternal Grandfather    Cancer Maternal Grandfather    Diabetes Paternal Grandmother    Diabetes Paternal Grandfather    Heart attack Paternal Grandfather      Past Surgical History:  Procedure Laterality Date   TOOTH EXTRACTION      Social History   Socioeconomic History   Marital status: Single    Spouse name: Not on file   Number of children: 1   Years  of education: 6th grade   Highest education level: 8th grade  Occupational History   Occupation: NA  Tobacco Use   Smoking status: Every Day    Current packs/day: 0.50    Average packs/day: 0.5 packs/day for 26.0 years (13.0 ttl pk-yrs)    Types: Cigarettes   Smokeless tobacco: Current   Tobacco comments:    Smokes 0.5 PPD- khj 03/18/2024  Vaping Use   Vaping status: Former   Quit date: 12/21/2019  Substance and Sexual Activity   Alcohol  use: Not Currently    Comment: seldom, once every 3-6 months   Drug use: Not Currently    Frequency: 7.0 times per week    Types: Heroin, Marijuana, Cocaine    Comment: last use 07/24/2023 heroin/Marijuana use 07/24/2023   Sexual activity: Yes    Partners: Female  Other Topics Concern   Not on file  Social History Narrative   Not on file   Social Drivers of Health   Financial Resource Strain: High Risk (01/12/2024)   Overall Financial Resource Strain (CARDIA)    Difficulty of Paying Living Expenses: Very hard  Food Insecurity: Food Insecurity Present (01/12/2024)   Hunger Vital Sign    Worried About Running Out of Food in the Last Year: Often true    Ran Out of Food in the Last Year: Sometimes true  Transportation Needs: Unmet Transportation Needs (01/12/2024)   PRAPARE - Transportation    Lack of Transportation (Medical): Yes    Lack of Transportation (Non-Medical): Yes  Physical Activity: Inactive (01/12/2024)   Exercise Vital Sign    Days of Exercise per Week: 0 days    Minutes of Exercise per Session: Not on file  Stress: Stress Concern Present (01/12/2024)   [employer] - Occupational Stress Questionnaire    Feeling of Stress: To some extent  Social Connections: Socially Isolated (01/12/2024)   Social Connection and Isolation Panel    Frequency of Communication with Friends and Family: Never    Frequency of Social Gatherings with Friends and Family: Never    Attends Religious Services: Never    Database administrator or Organizations: No    Attends Banker Meetings: Not on file    Marital Status: Never married  Intimate Partner Violence: Not At Risk (04/10/2022)   Humiliation, Afraid, Rape, and Kick questionnaire    Fear of Current or Ex-Partner: No    Emotionally Abused: No    Physically Abused: No    Sexually Abused: No     Allergies  Allergen Reactions   Carvedilol      Extreme fatigue   Penicillins Rash     CBC    Component Value Date/Time   WBC 4.6 01/10/2023 1447   WBC 13.7 (H) 09/24/2022 0559   RBC 5.65 01/10/2023 1447   RBC 4.86 09/24/2022 0559   HGB 16.1 01/10/2023 1447   HCT 49.9 01/10/2023 1447   PLT 171 01/10/2023 1447   MCV 88 01/10/2023 1447   MCV 93 09/06/2014 0436   MCH 28.5 01/10/2023 1447   MCH 27.8 09/24/2022 0559   MCHC 32.3 01/10/2023 1447   MCHC 31.7 09/24/2022 0559   RDW 13.8 01/10/2023 1447   RDW 14.1 09/06/2014 0436   LYMPHSABS 1.7 01/10/2023 1447   LYMPHSABS 0.5 (L) 09/06/2014 0436   MONOABS 0.5 09/22/2022 1316   MONOABS 0.3 09/06/2014 0436   EOSABS 0.1 01/10/2023 1447   EOSABS 0.0 09/06/2014 0436   BASOSABS 0.1 01/10/2023 1447   BASOSABS  0.0 09/06/2014 0436    Pulmonary Functions Testing Results:    Latest Ref Rng & Units 03/15/2024    2:47 PM  PFT Results  FVC-Pre L 2.02  P  FVC-Predicted Pre % 36  P  FVC-Post L 2.52  P  FVC-Predicted Post % 45  P  Pre FEV1/FVC % % 41  P  Post FEV1/FCV % % 41  P  FEV1-Pre L 0.83  P  FEV1-Predicted Pre % 18  P  FEV1-Post L 1.05  P  DLCO uncorrected ml/min/mmHg 16.57  P  DLCO UNC% % 50  P  DLVA Predicted % 87  P   TLC L 8.85  P  TLC % Predicted % 120  P  RV % Predicted % 325  P    P Preliminary result    Outpatient Medications Prior to Visit  Medication Sig Dispense Refill   albuterol  (VENTOLIN  HFA) 108 (90 Base) MCG/ACT inhaler Inhale 1-2 puffs into the lungs every 6 (six) hours as needed for wheezing or shortness of breath. 36 g 3   amLODipine  (NORVASC ) 5 MG tablet Take 1 tablet (5 mg total) by mouth daily. 90 tablet 4   empagliflozin  (JARDIANCE ) 10 MG TABS tablet Take 1 tablet (10 mg total) by mouth daily before breakfast. 90 tablet 4   furosemide  (LASIX ) 20 MG tablet Take 1 tablet (20 mg total) by mouth daily as needed for edema or fluid. 30 tablet 0   ipratropium-albuterol  (DUONEB) 0.5-2.5 (3) MG/3ML SOLN Take 3 mLs by nebulization every 6 (six) hours as needed.     METHADONE  HCL PO Take 120 mg by mouth daily. (Patient taking differently: Take 150 mg by mouth daily.)     montelukast  (SINGULAIR ) 10 MG tablet Take 1 tablet (10 mg total) by mouth once nightly at bedtime. 90 tablet 1   spironolactone  (ALDACTONE ) 25 MG tablet Take 1 tablet (25 mg total) by mouth daily. 90 tablet 3   valsartan  (DIOVAN ) 160 MG tablet Take 1 tablet (160 mg total) by mouth daily. 90 tablet 1   Fluticasone -Umeclidin-Vilant (TRELEGY ELLIPTA ) 200-62.5-25 MCG/ACT AEPB Inhale 1 puff into the lungs daily. 180 each 6   doxycycline  (VIBRA -TABS) 100 MG tablet Take 1 tablet (100 mg total) by mouth 2 (two) times daily. (Patient not taking: Reported on 03/18/2024) 14 tablet 0   nystatin  (MYCOSTATIN ) 100000 UNIT/ML suspension Take 5 mLs by mouth 4 (four) times daily.     No facility-administered medications prior to visit.

## 2024-03-20 LAB — ALLERGEN PANEL (27) + IGE
Alternaria Alternata IgE: <0.1 kU/L
Aspergillus Fumigatus IgE: <0.1 kU/L
Bahia Grass IgE: <0.1 kU/L
Bermuda Grass IgE: <0.1 kU/L
Cat Dander IgE: <0.1 kU/L
Cedar, Mountain IgE: <0.1 kU/L
Cladosporium Herbarum IgE: <0.1 kU/L
Cocklebur IgE: <0.1 kU/L
Cockroach, American IgE: <0.1 kU/L
Common Silver Birch IgE: <0.1 kU/L
D Farinae IgE: <0.1 kU/L
D Pteronyssinus IgE: <0.1 kU/L
Dog Dander IgE: <0.1 kU/L
Elm, American IgE: <0.1 kU/L
Hickory, White IgE: <0.1 kU/L
IgE (Immunoglobulin E), Serum: 22 [IU]/mL (ref 6–495)
Johnson Grass IgE: <0.1 kU/L
Kentucky Bluegrass IgE: <0.1 kU/L
Maple/Box Elder IgE: <0.1 kU/L
Mucor Racemosus IgE: <0.1 kU/L
Oak, White IgE: <0.1 kU/L
Penicillium Chrysogen IgE: <0.1 kU/L
Pigweed, Rough IgE: <0.1 kU/L
Plantain, English IgE: <0.1 kU/L
Ragweed, Short IgE: <0.1 kU/L
Setomelanomma Rostrat: <0.1 kU/L
Timothy Grass IgE: <0.1 kU/L
White Mulberry IgE: <0.1 kU/L

## 2024-03-22 ENCOUNTER — Ambulatory Visit: Payer: Self-pay | Admitting: Student in an Organized Health Care Education/Training Program

## 2024-03-25 ENCOUNTER — Other Ambulatory Visit: Payer: Self-pay

## 2024-04-08 NOTE — Progress Notes (Signed)
 Cardiology Office Note:    Date:  04/10/2024   ID:  Nathan Vasquez, DOB May 28, 1980, MRN [id number]  PCP:  Dineen Channel, PA-C   [facility] Providers Cardiologist:  Lonni Hanson, MD     Referring MD: Dineen Channel, PA-C   Chief complaint: 31-month follow-up of HFpEF     History of Present Illness:   Nathan Vasquez is a 43 y.o. male with a hx of HFpEF, HTN, asthma/COPD, hepatitis C, polysubstance abuse following up for his 76-month follow-up of heart failure.  He was hospitalized at [facility] in late April with worsening lower extremity edema, generalized fatigue, and exertional dyspnea. He was noted to be hypoxic with CT T of the chest showing nodular opacities in the lungs. He was empirically treated for community-acquired pneumonia and heart failure as BNP was elevated on presentation. Echocardiogram showed normal LVEF and diastolic parameters. RV was normal in size and function. No significant valvular abnormality was identified. He was subsequently evaluated in the advanced heart failure clinic by Ellouise Class, NP, he had minimal shortness of breath at that time. He was previously on carvedilol  but did not tolerate this due to marked fatigue.  Coronary CTA December 2024 showed a coronary calcium  score of 0, no evidence of CAD.  Most recent follow-up with cardiology was in February 2025.  At that time his chronic DOE remained unchanged, he continued to smoke marijuana, half a pack of cigarettes daily, and use heroin without plans of stopping.  Presents independently.  Denies chest pain, palpitations, dark/tarry/bloody stools, hematuria, significant weight gain, edema.  Reports chronic DOE secondary to asthma/COPD, unchanged over the past couple years, follows pulmonology for this.  Continues to smoke 0.5 PPD cigarettes, with daily marijuana use.  Reports dizziness/lightheadedness with exertion, mostly precipitated by going up a flight of stairs, denies dizziness  with positional changes.  Reports he has not used heroin in months, is very frustrated because he is having difficulties finding work with his history of being a 3X felon with time spent in prison.  On initial presentation, was very agitated, with stress revolving around financial concerns due to loss of food stamps.  Question whether we could start him on disability as he has difficulty finding work, and cannot perform manually strenuous job secondary to his DOE.   ROS:   Please see the history of present illness.     All other systems reviewed and are negative.     Past Medical History:  Diagnosis Date   (HFpEF) heart failure with preserved ejection fraction (HCC)    a. 08/2022 Echo: EF 55-60%, no rwma, nl RV fxn.   Asthma    Chest pain    a. 04/2023 Cor CTA: nl cors. Ca2+= 0.   COPD (chronic obstructive pulmonary disease) (HCC)    Gynecomastia    a. Noted on CT 04/2023 - in setting of spironolactone  rx.   Hep C w/o coma, chronic (HCC)    Heroin abuse (HCC)    a. OD x 9.   History of balanitis    Hypertension     Past Surgical History:  Procedure Laterality Date   TOOTH EXTRACTION      Current Medications: Current Meds  Medication Sig   albuterol  (VENTOLIN  HFA) 108 (90 Base) MCG/ACT inhaler Inhale 1-2 puffs into the lungs every 6 (six) hours as needed for wheezing or shortness of breath.   amLODipine  (NORVASC ) 5 MG tablet Take 1 tablet (5 mg total) by mouth daily.  empagliflozin  (JARDIANCE ) 10 MG TABS tablet Take 1 tablet (10 mg total) by mouth daily before breakfast.   Fluticasone -Umeclidin-Vilant (TRELEGY ELLIPTA ) 200-62.5-25 MCG/ACT AEPB Inhale 1 puff into the lungs daily.   furosemide  (LASIX ) 20 MG tablet Take 1 tablet (20 mg total) by mouth daily as needed for edema or fluid.   ipratropium-albuterol  (DUONEB) 0.5-2.5 (3) MG/3ML SOLN Take 3 mLs by nebulization every 6 (six) hours as needed.   METHADONE  HCL PO Take 120 mg by mouth daily. (Patient taking differently: Take  150 mg by mouth daily.)   montelukast  (SINGULAIR ) 10 MG tablet Take 1 tablet (10 mg total) by mouth once nightly at bedtime.   spironolactone  (ALDACTONE ) 25 MG tablet Take 1 tablet (25 mg total) by mouth daily.   valsartan  (DIOVAN ) 160 MG tablet Take 1 tablet (160 mg total) by mouth daily.     Allergies:   Carvedilol  and Penicillins   Social History   Socioeconomic History   Marital status: Single    Spouse name: Not on file   Number of children: 1   Years of education: 6th grade   Highest education level: 8th grade  Occupational History   Occupation: NA  Tobacco Use   Smoking status: Every Day    Current packs/day: 0.50    Average packs/day: 0.5 packs/day for 26.0 years (13.0 ttl pk-yrs)    Types: Cigarettes   Smokeless tobacco: Current   Tobacco comments:    Smokes 0.5 PPD- khj 03/18/2024  Vaping Use   Vaping status: Former   Quit date: 12/21/2019  Substance and Sexual Activity   Alcohol  use: Not Currently    Comment: seldom, once every 3-6 months   Drug use: Yes    Frequency: 7.0 times per week    Types: Heroin, Marijuana, Cocaine, Other-see comments    Comment: last use 07/24/2023 heroin/Marijuana use 07/24/2023   Sexual activity: Yes    Partners: Female  Other Topics Concern   Not on file  Social History Narrative   Not on file   Social Drivers of Health   Financial Resource Strain: High Risk (01/12/2024)   Overall Financial Resource Strain (CARDIA)    Difficulty of Paying Living Expenses: Very hard  Food Insecurity: Food Insecurity Present (01/12/2024)   Hunger Vital Sign    Worried About Running Out of Food in the Last Year: Often true    Ran Out of Food in the Last Year: Sometimes true  Transportation Needs: Unmet Transportation Needs (01/12/2024)   PRAPARE - Transportation    Lack of Transportation (Medical): Yes    Lack of Transportation (Non-Medical): Yes  Physical Activity: Inactive (01/12/2024)   Exercise Vital Sign    Days of Exercise per Week: 0 days     Minutes of Exercise per Session: Not on file  Stress: Stress Concern Present (01/12/2024)   [employer] of Occupational Health - Occupational Stress Questionnaire    Feeling of Stress: To some extent  Social Connections: Socially Isolated (01/12/2024)   Social Connection and Isolation Panel    Frequency of Communication with Friends and Family: Never    Frequency of Social Gatherings with Friends and Family: Never    Attends Religious Services: Never    Database Administrator or Organizations: No    Attends Engineer, Structural: Not on file    Marital Status: Never married     Family History: The patient's family history includes Anxiety disorder in his mother; COPD in his mother; Cancer in his maternal grandfather; Diabetes  in his maternal grandfather, maternal grandmother, mother, paternal grandfather, and paternal grandmother; Heart attack in his paternal grandfather; Heart disease in his mother; Schizophrenia in his father; Stroke (age of onset: 1) in his father.  EKGs/Labs/Other Studies Reviewed:    The following studies were reviewed today:  EKG Interpretation Date/Time:  Friday April 09 2024 13:47:00 EST Ventricular Rate:  100 PR Interval:  154 QRS Duration:  90 QT Interval:  336 QTC Calculation: 433 R Axis:   87  Text Interpretation: Normal sinus rhythm Right atrial enlargement Pulmonary disease pattern When compared with ECG of 25-Jul-2023 15:13, No significant change was found Confirmed by Elaine Moloney [phone]) on 04/09/2024 1:51:51 PM    Recent Labs: 11/07/2023: B Natriuretic Peptide 93.8 12/11/2023: BUN 16; Creatinine, Ser 1.00; Potassium 4.4; Sodium 138 03/18/2024: Hemoglobin 16.3; Platelets 171  Recent Lipid Panel    Component Value Date/Time   CHOL 162 01/10/2023 1447   TRIG 77 01/10/2023 1447   HDL 61 01/10/2023 1447   CHOLHDL 2.7 01/10/2023 1447   LDLCALC 86 01/10/2023 1447     Physical Exam:    VS:  BP 108/70 (BP Location: Left  Arm, Patient Position: Sitting, Cuff Size: Normal)   Pulse 100   Ht 6' (1.829 m)   Wt 189 lb (85.7 kg)   SpO2 97%   BMI 25.63 kg/m        Wt Readings from Last 3 Encounters:  04/09/24 189 lb (85.7 kg)  03/18/24 190 lb 9.6 oz (86.5 kg)  03/15/24 191 lb 3.2 oz (86.7 kg)     GEN: Appears agitated/frustrated, in no acute cardiovascular distress HEENT: Normal NECK: No carotid bruits CARDIAC:  S1-S2 normal, RRR, no murmurs, rubs, gallops RESPIRATORY: Expiratory wheezing present throughout all lung fields, no rhonchi, Rales, rubs MUSCULOSKELETAL:  No edema; No deformity  SKIN: Warm and dry NEUROLOGIC:  Alert and oriented x 3, head shaking PSYCHIATRIC: Agitated  Staff ambulated around unit Resting: HR 95, O2 92% Walking: HR 104, O2 90%     Assessment & Plan Tachycardia EKG: NSR, 100 bpm, right atrial enlargement, pulmonary disease pattern Had staff ambulated patient around unit, no reported dizziness with exertion Resting HR 95, O2 92% Ambulating HR 104, O2 90% Mild tachycardia likely due to agitation, recent cigarette use, mild hypoxia, mild dehydration Advised patient to continue inhalers as prescribed by pulmonology and to stop smoking.  Follow up with pulmonology for further disability claims regarding DOE w/ wheezing. Proceed to ED if symptoms worsen Chronic heart failure with preserved ejection fraction (HFpEF) (HCC) Hospitalized 08/2022 with lower extremity edema and heart failure Echo 09/23/2022: LVEF 55-60%, no RWMA, normal diastolic parameters, no valvular abnormalities, RA pressure 8 mmHg. 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 secondary to DOE with exercise intolerance/deconditioning Will defer to pulmonology, as DOE does not appear to be cardiac related at this time Will refer to social work for further guidance on community resources that may assist patient in finding employment and financial assistance Polysubstance abuse (HCC) History of 9 previous overdoses Stopped  heroin use in July Continue smoking 0.5 PPD of cigarettes and daily marijuana use Discussed smoking cessation Continue attendance with methadone  clinic as needed Primary hypertension BP well-controlled in clinic and on review of prior documentation over the last year Does not routinely monitor BP at home Continue spironolactone  25 mg daily Continue valsartan  160 mg daily Continue amlodipine  5 mg daily  Keep appointment with advanced heart failure in December Follow-up with general cardiology in 6 months or sooner if new symptoms occur          Medication Adjustments/Labs and Tests Ordered: Current medicines are reviewed at length with the patient today.  Concerns regarding medicines are outlined above.  Orders Placed This Encounter  Procedures   Referral to HRT/VAS Care Navigation   EKG 12-Lead   No orders of the defined types were placed in this encounter.   Patient Instructions  Medication Instructions:  No changes *If you need a refill on your cardiac medications before your next appointment, please call your pharmacy*  Lab Work: None ordered If you have labs (blood work) drawn today and your tests are completely normal, you will receive your results only by: MyChart Message (if you have MyChart) OR A paper copy in the mail If you have any lab test that is abnormal or we need to change your  treatment, we will call you to review the results.  Testing/Procedures: None ordered  Follow-Up: At [facility] you and your health needs are our priority.  As part of our continuing mission to provide you with exceptional heart care, our providers are all part of one team.  This team includes your primary Cardiologist (physician) and Advanced Practice Providers or APPs (Physician Assistants and Nurse Practitioners) who all work together to provide you with the care you need, when you need it.  Your next appointment:   6 month(s)  Provider:   You may see Lonni Hanson, MD or one of the following Advanced Practice Providers on your designated Care Team:   Lonni Meager, NP Lesley Maffucci, PA-C Bernardino Bring, PA-C Cadence [place], PA-C Tylene Lunch, NP Barnie Hila, NP    We recommend signing up for the patient portal called MyChart.  Sign up information is provided on this After Visit Summary.  MyChart is used to connect with patients for Virtual Visits (Telemedicine).  Patients are able to view lab/test results, encounter notes, upcoming appointments, etc.  Non-urgent messages can be sent to your provider as well.   To learn more about what you can do with MyChart, go to [email or web address].   Other Instructions A referral has been made to our child psychotherapist. They will reach out to you to see how they can be of assistance.           Signed, Miriam FORBES Shams, NP  04/10/2024 9:01 AM    [facility]

## 2024-04-09 ENCOUNTER — Ambulatory Visit: Payer: MEDICAID | Attending: Nurse Practitioner | Admitting: Emergency Medicine

## 2024-04-09 ENCOUNTER — Encounter: Payer: Self-pay | Admitting: Nurse Practitioner

## 2024-04-09 VITALS — BP 108/70 | HR 100 | Ht 72.0 in | Wt 189.0 lb

## 2024-04-09 DIAGNOSIS — R000 Tachycardia, unspecified: Secondary | ICD-10-CM | POA: Diagnosis present

## 2024-04-09 DIAGNOSIS — F1910 Other psychoactive substance abuse, uncomplicated: Secondary | ICD-10-CM | POA: Diagnosis present

## 2024-04-09 DIAGNOSIS — I10 Essential (primary) hypertension: Secondary | ICD-10-CM | POA: Diagnosis present

## 2024-04-09 DIAGNOSIS — Z560 Unemployment, unspecified: Secondary | ICD-10-CM | POA: Insufficient documentation

## 2024-04-09 DIAGNOSIS — I5032 Chronic diastolic (congestive) heart failure: Secondary | ICD-10-CM | POA: Diagnosis present

## 2024-04-09 NOTE — Patient Instructions (Signed)
 Medication Instructions:  No changes *If you need a refill on your cardiac medications before your next appointment, please call your pharmacy*  Lab Work: None ordered If you have labs (blood work) drawn today and your tests are completely normal, you will receive your results only by: MyChart Message (if you have MyChart) OR A paper copy in the mail If you have any lab test that is abnormal or we need to change your treatment, we will call you to review the results.  Testing/Procedures: None ordered  Follow-Up: At [facility], you and your health needs are our priority.  As part of our continuing mission to provide you with exceptional heart care, our providers are all part of one team.  This team includes your primary Cardiologist (physician) and Advanced Practice Providers or APPs (Physician Assistants and Nurse Practitioners) who all work together to provide you with the care you need, when you need it.  Your next appointment:   6 month(s)  Provider:   You may see Lonni Hanson, MD or one of the following Advanced Practice Providers on your designated Care Team:   Lonni Meager, NP Lesley Maffucci, PA-C Bernardino Bring, PA-C Cadence [place], PA-C Tylene Lunch, NP Barnie Hila, NP    We recommend signing up for the patient portal called MyChart.  Sign up information is provided on this After Visit Summary.  MyChart is used to connect with patients for Virtual Visits (Telemedicine).  Patients are able to view lab/test results, encounter notes, upcoming appointments, etc.  Non-urgent messages can be sent to your provider as well.   To learn more about what you can do with MyChart, go to [email or web address].   Other Instructions A referral has been made to our child psychotherapist. They will reach out to you to see how they can be of assistance.

## 2024-04-12 ENCOUNTER — Telehealth (HOSPITAL_COMMUNITY): Payer: Self-pay | Admitting: Licensed Clinical Social Worker

## 2024-04-12 NOTE — Telephone Encounter (Signed)
 H&V Care Navigation CSW Progress Note  Clinical Social Worker consulted to speak with pt regarding employment and disability concerns.  Pt reports he has attempted to apply for disability and was told he doesn't qualify for SSDI due to lack of work history.  CSW explained difference between SSDI and SSI which would not require work history.  Pt states that he did apply online a few months ago and never heard anything.  Encouraged him to login to his account to see what it says is the hold up on the application- told him to reach out if he needed assistance after figuring out the hold up on the account.  Patient unsure about applying for employment at this time due to his poor health.  States he has history of physical work which he would not be able to complete at this time.  Has spoken with [employer] but they could not help him much given his criminal history.  CSW sent information regarding [employer] employment as they work with those who may have barriers to employment elsewhere.    Pt living with his mom who is supporting him at this time- reports no other concerns- encouraged him to reach out with further questions or concerns.  Nathan HILARIO Leech, LCSW Clinical Social Worker [facility] Desk#: [phone] Cell#: [phone]

## 2024-04-13 ENCOUNTER — Ambulatory Visit: Payer: MEDICAID | Admitting: Physician Assistant

## 2024-04-25 ENCOUNTER — Other Ambulatory Visit: Payer: Self-pay | Admitting: Physician Assistant

## 2024-04-25 DIAGNOSIS — J4489 Other specified chronic obstructive pulmonary disease: Secondary | ICD-10-CM

## 2024-04-26 ENCOUNTER — Other Ambulatory Visit: Payer: Self-pay | Admitting: Physician Assistant

## 2024-04-26 ENCOUNTER — Other Ambulatory Visit: Payer: Self-pay

## 2024-04-26 DIAGNOSIS — J4489 Other specified chronic obstructive pulmonary disease: Secondary | ICD-10-CM

## 2024-04-27 ENCOUNTER — Ambulatory Visit (HOSPITAL_COMMUNITY): Payer: Self-pay | Admitting: Internal Medicine

## 2024-04-27 ENCOUNTER — Ambulatory Visit: Admission: RE | Admit: 2024-04-27 | Discharge: 2024-04-27 | Payer: MEDICAID | Attending: Family

## 2024-04-27 DIAGNOSIS — I110 Hypertensive heart disease with heart failure: Secondary | ICD-10-CM | POA: Diagnosis not present

## 2024-04-27 DIAGNOSIS — F1910 Other psychoactive substance abuse, uncomplicated: Secondary | ICD-10-CM | POA: Diagnosis not present

## 2024-04-27 DIAGNOSIS — I10 Essential (primary) hypertension: Secondary | ICD-10-CM

## 2024-04-27 DIAGNOSIS — I5032 Chronic diastolic (congestive) heart failure: Secondary | ICD-10-CM | POA: Diagnosis not present

## 2024-04-27 DIAGNOSIS — R079 Chest pain, unspecified: Secondary | ICD-10-CM | POA: Insufficient documentation

## 2024-04-27 DIAGNOSIS — J449 Chronic obstructive pulmonary disease, unspecified: Secondary | ICD-10-CM | POA: Insufficient documentation

## 2024-04-27 LAB — ECHOCARDIOGRAM COMPLETE: S' Lateral: 2.6 cm

## 2024-04-27 NOTE — Progress Notes (Signed)
*  PRELIMINARY RESULTS* Echocardiogram 2D Echocardiogram has been performed.  Nathan Vasquez 04/27/2024, 11:28 AM

## 2024-05-04 ENCOUNTER — Telehealth: Payer: Self-pay | Admitting: Family

## 2024-05-04 NOTE — Telephone Encounter (Signed)
 Called to confirm/remind patient of their appointment at the Advanced Heart Failure Clinic on 05/05/24.   Appointment:   [x] Confirmed  [] Left mess   [] No answer/No voice mail  [] VM Full/unable to leave message  [] Phone not in service  Patient reminded to bring all medications and/or complete list.  Confirmed patient has transportation. Gave directions, instructed to utilize valet parking.

## 2024-05-04 NOTE — Progress Notes (Unsigned)
 Advanced Heart Failure Clinic Note    PCP: Emilio Kelly DASEN, FNP  Cardiologist: Mady Bruckner, MD   Chief Complaint: shortness of breath   HPI:  Nathan Vasquez is a 43 y.o.male with a history of HTN, bipolar disorder, moderate persistent asthma, COPD, hepatitis C with SVR, opiod use disorder, anxiety, tobacco use and chronic heart failure. Broke his left humerus 02/24 after a fall.   Was in the ED 07/25/22 after falling. Xray confirmed left humeral neck fracture. Placed arm in sling with orthpaedic f/u.   Admitted 09/22/22 due to lower extremity swelling, generalized fatigue, dyspnea on exertion. Currently follows at outpatient methadone  clinic but recently used fentanyl. Upon arrival noted to be hypoxic requiring 6 L nasal cannula, CTA chest was negative for PE but showed bilateral nodular opacity. Echo 09/23/22: EF 55-60%. Patient was started on azithromycin , Rocephin  and diuretics. BNP minimally elevated. Procalcitonin is negative. COVID-negative.  Full respiratory panel-Neg   Cardiac CTA 04/28/23: Coronary calcium  score of 0. Normal coronary origin with right dominance. No evidence of CAD. CAD-RADS 0. Consider non-atherosclerotic causes of chest pain.  Seen in [facility] 06/25 where valsartan  was increased to 160mg  daily along with 20mg  lasix  daily for 5 days.   Today he returns for a HF follow-up visit, with his girlfriend, with a chief complaint of minimal shortness of breath. Has associated productive cough of green sputum, occasional dizziness with coughing. Sleeping ok. Took 5 days of lasix  and is now only taking it PRN and hasn't taken it since the 5 day course. Doesn't feel like it helped much when he took it daily. His mom babysits his 2 young nephews and feels like they are just passing this virus around back and forth. Continues to go to the methadone  clinic 3 days/ week.   Smokes 1/2PPD. Has been clean from all drugs since 11/28/23 except for marijuana use. Drinks a 2L of soda and 1/2  gallon of water daily as she says that his mouth stays very dry.   ROS: All systems negative except as listed in HPI, PMH and Problem List.  SH:  Social History   Socioeconomic History   Marital status: Single    Spouse name: Not on file   Number of children: 1   Years of education: 6th grade   Highest education level: 6th grade  Occupational History   Occupation: NA  Tobacco Use   Smoking status: Every Day    Current packs/day: 0.50    Average packs/day: 0.5 packs/day for 26.0 years (13.0 ttl pk-yrs)    Types: Cigarettes   Smokeless tobacco: Current   Tobacco comments:    Smokes 0.5 PPD- khj 03/18/2024  Vaping Use   Vaping status: Former   Quit date: 12/21/2019  Substance and Sexual Activity   Alcohol  use: Not Currently    Comment: seldom, once every 3-6 months   Drug use: Yes    Frequency: 7.0 times per week    Types: Heroin, Marijuana, Cocaine, Other-see comments    Comment: last use 07/24/2023 heroin/Marijuana use 07/24/2023   Sexual activity: Yes    Partners: Female  Other Topics Concern   Not on file  Social History Narrative   Not on file   Social Drivers of Health   Financial Resource Strain: High Risk (04/12/2024)   Overall Financial Resource Strain (CARDIA)    Difficulty of Paying Living Expenses: Very hard  Food Insecurity: Patient Declined (04/12/2024)   Hunger Vital Sign    Worried About Running Out of  Food in the Last Year: Patient declined    Ran Out of Food in the Last Year: Patient declined  Transportation Needs: Unknown (04/12/2024)   PRAPARE - Transportation    Lack of Transportation (Medical): No    Lack of Transportation (Non-Medical): Patient declined  Physical Activity: Unknown (04/12/2024)   Exercise Vital Sign    Days of Exercise per Week: Patient declined    Minutes of Exercise per Session: Not on file  Stress: Stress Concern Present (04/12/2024)   [employer] of Occupational Health - Occupational Stress Questionnaire    Feeling  of Stress: To some extent  Social Connections: Socially Isolated (04/12/2024)   Social Connection and Isolation Panel    Frequency of Communication with Friends and Family: Never    Frequency of Social Gatherings with Friends and Family: Never    Attends Religious Services: Never    Database Administrator or Organizations: No    Attends Engineer, Structural: Not on file    Marital Status: Never married  Intimate Partner Violence: Not At Risk (04/10/2022)   Humiliation, Afraid, Rape, and Kick questionnaire    Fear of Current or Ex-Partner: No    Emotionally Abused: No    Physically Abused: No    Sexually Abused: No   FH:  Family History  Problem Relation Age of Onset   COPD Mother    Diabetes Mother    Heart disease Mother    Anxiety disorder Mother    Stroke Father 44   Schizophrenia Father    Diabetes Maternal Grandmother    Diabetes Maternal Grandfather    Cancer Maternal Grandfather    Diabetes Paternal Grandmother    Diabetes Paternal Grandfather    Heart attack Paternal Grandfather    Past Medical History:  Diagnosis Date   (HFpEF) heart failure with preserved ejection fraction (HCC)    a. 08/2022 Echo: EF 55-60%, no rwma, nl RV fxn.   Asthma    Chest pain    a. 04/2023 Cor CTA: nl cors. Ca2+= 0.   COPD (chronic obstructive pulmonary disease) (HCC)    Gynecomastia    a. Noted on CT 04/2023 - in setting of spironolactone  rx.   Hep C w/o coma, chronic (HCC)    Heroin abuse (HCC)    a. OD x 9.   History of balanitis    Hypertension    Current Outpatient Medications  Medication Sig Dispense Refill   albuterol  (VENTOLIN  HFA) 108 (90 Base) MCG/ACT inhaler Inhale 1-2 puffs into the lungs every 6 (six) hours as needed for wheezing or shortness of breath. 36 g 3   amLODipine  (NORVASC ) 5 MG tablet Take 1 tablet (5 mg total) by mouth daily. 90 tablet 4   empagliflozin  (JARDIANCE ) 10 MG TABS tablet Take 1 tablet (10 mg total) by mouth daily before breakfast. 90  tablet 4   Fluticasone -Umeclidin-Vilant (TRELEGY ELLIPTA ) 200-62.5-25 MCG/ACT AEPB Inhale 1 puff into the lungs daily. 180 each 12   furosemide  (LASIX ) 20 MG tablet Take 1 tablet (20 mg total) by mouth daily as needed for edema or fluid. 30 tablet 0   ipratropium-albuterol  (DUONEB) 0.5-2.5 (3) MG/3ML SOLN Take 3 mLs by nebulization every 6 (six) hours as needed.     METHADONE  HCL PO Take 120 mg by mouth daily. (Patient taking differently: Take 150 mg by mouth daily.)     montelukast  (SINGULAIR ) 10 MG tablet Take 1 tablet (10 mg total) by mouth once nightly at bedtime. 90 tablet 1  spironolactone  (ALDACTONE ) 25 MG tablet Take 1 tablet (25 mg total) by mouth daily. 90 tablet 3   valsartan  (DIOVAN ) 160 MG tablet Take 1 tablet (160 mg total) by mouth daily. 90 tablet 1   No current facility-administered medications for this visit.   There were no vitals filed for this visit.  Wt Readings from Last 3 Encounters:  04/09/24 189 lb (85.7 kg)  03/18/24 190 lb 9.6 oz (86.5 kg)  03/15/24 191 lb 3.2 oz (86.7 kg)   Lab Results  Component Value Date   CREATININE 1.00 12/11/2023   CREATININE 0.90 11/07/2023   CREATININE 1.06 07/25/2023    PHYSICAL EXAM:  General: Well appearing.  Cor: No JVD. Regular rhythm, rate.  Lungs: clear Abdomen: soft, nontender, nondistended. Extremities: no edema Neuro:. Affect pleasant   ECG: not done   ASSESSMENT & PLAN:  1: Chronic heart failure with preserved ejection fraction- - suspect due to HTN/ substance use - NYHA class II - euvolemic today - Echo 09/23/22: EF 55-60% - Cardiac CTA 04/28/23: Coronary calcium  score of 0. Normal coronary origin with right dominance.  No evidence of CAD. CAD-RADS 0. Consider non-atherosclerotic causes of chest pain - attempted to do ReDs reading but got low quality both times - updated echo scheduled for 04/27/24 - weight stable from last visit here 1 month ago - saw cardiology (End)  2/25 - continue jardiance  10mg   daily  - continue furosemide  20mg  daily PRN - continue valsartan  160 mg daily - Continue spiro 25 mg daily - BMET today - encouraged to decrease fluid intake but he says that his mouth stays so dry; most likely his dry mouth is from his daily methadone   2: HTN- - BP 119/78 - continue valsartan  160 mg daily - continue amlodipine  5mg  daily - BMET 11/07/23 reviewed: sodium 136, potassium 4.3, creatinine 0.9 & GFR >60 - BMET today  3: Substance abuse- - follows at methadone  clinic 3 days/ week - Smoking 1/2 PPD, knows that he needs to quit. Trying.   4: Moderate persistent asthma- - continue singulair  10mg  daily - continue advair  500/50 BID & make sure rinse/ spit after using this - has nebulizer for PRN usage   Keep already scheduled appt 12/25, sooner if needed.   Ellouise DELENA Class NP-BC  12/11/23

## 2024-05-05 ENCOUNTER — Encounter: Payer: Self-pay | Admitting: Family

## 2024-05-05 ENCOUNTER — Ambulatory Visit: Payer: MEDICAID | Attending: Family | Admitting: Family

## 2024-05-05 VITALS — Wt 192.6 lb

## 2024-05-05 DIAGNOSIS — F1910 Other psychoactive substance abuse, uncomplicated: Secondary | ICD-10-CM

## 2024-05-05 DIAGNOSIS — I10 Essential (primary) hypertension: Secondary | ICD-10-CM | POA: Diagnosis not present

## 2024-05-05 DIAGNOSIS — J4489 Other specified chronic obstructive pulmonary disease: Secondary | ICD-10-CM

## 2024-05-05 DIAGNOSIS — F1911 Other psychoactive substance abuse, in remission: Secondary | ICD-10-CM | POA: Insufficient documentation

## 2024-05-05 DIAGNOSIS — F17210 Nicotine dependence, cigarettes, uncomplicated: Secondary | ICD-10-CM | POA: Insufficient documentation

## 2024-05-05 DIAGNOSIS — Z59868 Other specified financial insecurity: Secondary | ICD-10-CM | POA: Insufficient documentation

## 2024-05-05 DIAGNOSIS — F329 Major depressive disorder, single episode, unspecified: Secondary | ICD-10-CM | POA: Insufficient documentation

## 2024-05-05 DIAGNOSIS — R5383 Other fatigue: Secondary | ICD-10-CM | POA: Insufficient documentation

## 2024-05-05 DIAGNOSIS — F418 Other specified anxiety disorders: Secondary | ICD-10-CM | POA: Insufficient documentation

## 2024-05-05 DIAGNOSIS — J4540 Moderate persistent asthma, uncomplicated: Secondary | ICD-10-CM | POA: Insufficient documentation

## 2024-05-05 DIAGNOSIS — I5032 Chronic diastolic (congestive) heart failure: Secondary | ICD-10-CM | POA: Insufficient documentation

## 2024-05-05 DIAGNOSIS — I110 Hypertensive heart disease with heart failure: Secondary | ICD-10-CM | POA: Insufficient documentation

## 2024-05-05 DIAGNOSIS — R0602 Shortness of breath: Secondary | ICD-10-CM | POA: Insufficient documentation

## 2024-05-05 DIAGNOSIS — Z79899 Other long term (current) drug therapy: Secondary | ICD-10-CM | POA: Insufficient documentation

## 2024-05-21 ENCOUNTER — Other Ambulatory Visit: Payer: Self-pay

## 2024-05-21 ENCOUNTER — Other Ambulatory Visit: Payer: Self-pay | Admitting: Physician Assistant

## 2024-05-21 ENCOUNTER — Other Ambulatory Visit: Payer: Self-pay | Admitting: Emergency Medicine

## 2024-05-21 ENCOUNTER — Other Ambulatory Visit: Payer: Self-pay | Admitting: Family

## 2024-05-21 DIAGNOSIS — J4489 Other specified chronic obstructive pulmonary disease: Secondary | ICD-10-CM

## 2024-05-21 MED ORDER — MONTELUKAST SODIUM 10 MG PO TABS
10.0000 mg | ORAL_TABLET | Freq: Every day | ORAL | 1 refills | Status: AC
  Filled 2024-05-21: qty 90, 90d supply, fill #0

## 2024-05-21 MED ORDER — SPIRONOLACTONE 25 MG PO TABS
25.0000 mg | ORAL_TABLET | Freq: Every day | ORAL | 3 refills | Status: AC
  Filled 2024-05-21: qty 90, 90d supply, fill #0

## 2024-05-21 MED FILL — Valsartan Tab 160 MG: ORAL | 90 days supply | Qty: 90 | Fill #0 | Status: AC

## 2024-05-22 ENCOUNTER — Emergency Department
Admission: EM | Admit: 2024-05-22 | Discharge: 2024-05-22 | Disposition: A | Discharge status: Home / Self Care | Payer: MEDICAID | Attending: Emergency Medicine | Admitting: Emergency Medicine

## 2024-05-22 ENCOUNTER — Other Ambulatory Visit: Payer: Self-pay

## 2024-05-22 DIAGNOSIS — S0181XA Laceration without foreign body of other part of head, initial encounter: Secondary | ICD-10-CM | POA: Diagnosis present

## 2024-05-22 DIAGNOSIS — J4489 Other specified chronic obstructive pulmonary disease: Secondary | ICD-10-CM | POA: Diagnosis not present

## 2024-05-22 DIAGNOSIS — W1830XA Fall on same level, unspecified, initial encounter: Secondary | ICD-10-CM | POA: Diagnosis not present

## 2024-05-22 DIAGNOSIS — I10 Essential (primary) hypertension: Secondary | ICD-10-CM | POA: Insufficient documentation

## 2024-05-22 DIAGNOSIS — R42 Dizziness and giddiness: Secondary | ICD-10-CM | POA: Insufficient documentation

## 2024-05-22 LAB — CBC
HCT: 49.2 % (ref 39.0–52.0)
Hemoglobin: 15.8 g/dL (ref 13.0–17.0)
MCH: 29.9 pg (ref 26.0–34.0)
MCHC: 32.1 g/dL (ref 30.0–36.0)
MCV: 93.2 fL (ref 80.0–100.0)
Platelets: 247 K/uL (ref 150–400)
RBC: 5.28 MIL/uL (ref 4.22–5.81)
RDW: 12.6 % (ref 11.5–15.5)
WBC: 7.1 K/uL (ref 4.0–10.5)
nRBC: 0 % (ref 0.0–0.2)

## 2024-05-22 LAB — URINALYSIS, ROUTINE W REFLEX MICROSCOPIC
Bacteria, UA: NONE SEEN
Bilirubin Urine: NEGATIVE
Glucose, UA: >=500 mg/dL — AB
Hgb urine dipstick: NEGATIVE
Ketones, ur: NEGATIVE mg/dL
Nitrite: NEGATIVE
Protein, ur: NEGATIVE mg/dL
Specific Gravity, Urine: 1.014 (ref 1.005–1.030)
pH: 6 (ref 5.0–8.0)

## 2024-05-22 LAB — COMPREHENSIVE METABOLIC PANEL WITH GFR
ALT: 16 U/L (ref 0–44)
AST: 22 U/L (ref 15–41)
Albumin: 4.1 g/dL (ref 3.5–5.0)
Alkaline Phosphatase: 101 U/L (ref 38–126)
Anion gap: 8 (ref 5–15)
BUN: 12 mg/dL (ref 6–20)
CO2: 29 mmol/L (ref 22–32)
Calcium: 9.1 mg/dL (ref 8.9–10.3)
Chloride: 100 mmol/L (ref 98–111)
Creatinine, Ser: 0.94 mg/dL (ref 0.61–1.24)
GFR, Estimated: >60 mL/min
Glucose, Bld: 71 mg/dL (ref 70–99)
Potassium: 4.5 mmol/L (ref 3.5–5.1)
Sodium: 137 mmol/L (ref 135–145)
Total Bilirubin: 0.2 mg/dL (ref 0.0–1.2)
Total Protein: 7.1 g/dL (ref 6.5–8.1)

## 2024-05-22 LAB — CBG MONITORING, ED: Glucose-Capillary: 86 mg/dL (ref 70–99)

## 2024-05-22 MED ORDER — IBUPROFEN 600 MG PO TABS
600.0000 mg | ORAL_TABLET | Freq: Once | ORAL | Status: AC
  Administered 2024-05-22: 600 mg via ORAL
  Filled 2024-05-22: qty 1

## 2024-05-22 MED ORDER — LIDOCAINE-EPINEPHRINE-TETRACAINE (LET) TOPICAL GEL
3.0000 mL | Freq: Once | TOPICAL | Status: DC
  Filled 2024-05-22: qty 3

## 2024-05-22 MED ORDER — LIDOCAINE HCL (PF) 1 % IJ SOLN
5.0000 mL | Freq: Once | INTRAMUSCULAR | Status: DC
  Filled 2024-05-22: qty 5

## 2024-05-22 NOTE — ED Triage Notes (Signed)
 Pt to Ed via [facility] from home. Pt reports stood up to fast and got dizzy and fell. Pt hit chin with a 1/2 inch lac. Denies LOC. No blood thinners.   131/93 94% RA HR 91

## 2024-05-22 NOTE — ED Notes (Signed)
 Pt given cup of water

## 2024-05-22 NOTE — Discharge Instructions (Signed)
 Follow-up with your primary care provider if any continued problems.  Also sutures need to be removed in 7 to 10 days.  Watch for any signs of infection.  Clean the area daily with mild soap and water.  See your primary care, urgent care or return to the emergency department if any signs of infection such as redness or pus.  Always remember to sit on the side of bed for a few minutes to avoid being dizzy when you first stand up and do this slowly.

## 2024-05-22 NOTE — ED Provider Notes (Signed)
 "  [facility] Provider Note    Event Date/Time   First MD Initiated Contact with Patient 05/22/24 [phone]     (approximate)   History   Dizziness   HPI  Nathan Vasquez is a 43 y.o. male   presents to the ED via EMS with complaint of laceration to his chin.  Patient states that he got up quickly at approximately 6 AM to go to the bathroom.  He states that he was slightly dizzy and thought he was reaching for the wall in the dark and nothing was there.  He fell but was able to get up on his own.  No continued dizziness.  He then went to the bathroom and shaved because the laceration was on his chin.  He denies any head injury or loss of consciousness.  He has been ambulatory since that time.  Patient has history of hypertension, asthma, COPD, polysubstance abuse and hep C.      Physical Exam   Triage Vital Signs: ED Triage Vitals [05/22/24 0732]  Encounter Vitals Group     BP 110/78     Girls Systolic BP Percentile      Girls Diastolic BP Percentile      Boys Systolic BP Percentile      Boys Diastolic BP Percentile      Pulse Rate 82     Resp 19     Temp 97.6 F (36.4 C)     Temp Source Oral     SpO2 93 %     Weight      Height      Head Circumference      Peak Flow      Pain Score      Pain Loc      Pain Education      Exclude from Growth Chart     Most recent vital signs: Vitals:   05/22/24 0732  BP: 110/78  Pulse: 82  Resp: 19  Temp: 97.6 F (36.4 C)  SpO2: 93%     General: Awake, no distress.  Alert, talkative, able answer questions appropriately.  Patient is ambulatory in the ED without any assistance. CV:  Good peripheral perfusion.  Regular rate and rhythm. Resp:  Normal effort.  Clear bilaterally. Abd:  No distention.  Other:  Laceration to chin area without active bleeding.   ED Results / Procedures / Treatments   Labs (all labs ordered are listed, but only abnormal results are displayed) Labs Reviewed   URINALYSIS, ROUTINE W REFLEX MICROSCOPIC - Abnormal; Notable for the following components:      Result Value   Color, Urine YELLOW (*)    APPearance CLEAR (*)    Glucose, UA >=500 (*)    Leukocytes,Ua TRACE (*)    All other components within normal limits  COMPREHENSIVE METABOLIC PANEL WITH GFR  CBC  CBG MONITORING, ED     EKG  Vent. rate 90 BPM PR interval 142 ms QRS duration 84 ms QT/QTcB 342/418 ms P-R-T axes 80 104 71 Normal sinus rhythm Right atrial enlargement Rightward axis Pulmonary disease pattern Abnormal ECG When compared with ECG of 22-May-2024 07:38, (unconfirmed) No significant change was found   RADIOLOGY   PROCEDURES:  Critical Care performed:   .Laceration Repair  Date/Time: 05/22/2024 8:33 AM  Performed by: Saunders Shona CROME, PA-C Authorized by: Saunders Shona CROME, PA-C   Consent:    Consent obtained:  Verbal   Consent given by:  Patient  Risks discussed:  Infection, pain, poor wound healing and poor cosmetic result Universal protocol:    Patient identity confirmed:  Verbally with patient Anesthesia:    Anesthesia method:  Topical application and local infiltration   Topical anesthetic:  LET   Local anesthetic:  Lidocaine  1% WITH epi Laceration details:    Location:  Face   Face location:  Chin   Length (cm):  1.5 Pre-procedure details:    Preparation:  Patient was prepped and draped in usual sterile fashion Exploration:    Hemostasis achieved with:  Direct pressure   Contaminated: no   Treatment:    Area cleansed with:  Saline   Amount of cleaning:  Standard   Irrigation solution:  Sterile saline   Irrigation method:  Syringe and tap   Visualized foreign bodies/material removed: no   Skin repair:    Repair method:  Sutures   Suture size:  6-0   Suture material:  Prolene   Suture technique:  Simple interrupted   Number of sutures:  3 Approximation:    Approximation:  Close Repair type:    Repair type:  Simple Post-procedure  details:    Dressing:  Open (no dressing)   Procedure completion:  Tolerated    MEDICATIONS ORDERED IN ED: Medications  lidocaine  (PF) (XYLOCAINE ) 1 % injection 5 mL (has no administration in time range)  ibuprofen  (ADVIL ) tablet 600 mg (600 mg Oral Given 05/22/24 0840)     IMPRESSION / MDM / ASSESSMENT AND PLAN / ED COURSE  I reviewed the triage vital signs and the nursing notes.   Differential diagnosis includes, but is not limited to, laceration chin, orthostatic hypotension, dizziness, hypokalemia, hyponatremia, anemia considered.  43 year old male is brought to the ED via EMS after he stood up too fast and got dizzy at approximately 6 AM out of bed.  Patient states that he hit his chin but did not hit his head and there was no loss of consciousness.  Patient tolerated suturing well and was instructed to have the stitches taken out in 7 to 10 days.  He is watch for any signs of infection and follow-up with his PCP, urgent care or return to the emergency department.  Patient was ambulatory without any assistance during his ED visit and at the time of discharge.      Patient's presentation is most consistent with acute illness / injury with system symptoms.  FINAL CLINICAL IMPRESSION(S) / ED DIAGNOSES   Final diagnoses:  Chin laceration, initial encounter  Dizziness     Rx / DC Orders   ED Discharge Orders     None        Note:  This document was prepared using Dragon voice recognition software and may include unintentional dictation errors.   Saunders Shona CROME, PA-C 05/22/24 1302    Arlander Charleston, MD 05/22/24 1510  "

## 2024-05-22 NOTE — ED Notes (Signed)
 PA Rhonda at bedside. Lido given, pt sutured.

## 2024-05-24 ENCOUNTER — Other Ambulatory Visit: Payer: Self-pay

## 2024-06-07 ENCOUNTER — Ambulatory Visit: Payer: MEDICAID | Admitting: Physician Assistant

## 2024-08-03 ENCOUNTER — Ambulatory Visit: Payer: MEDICAID | Admitting: Family

## 2024-09-20 ENCOUNTER — Ambulatory Visit: Payer: MEDICAID | Admitting: Student in an Organized Health Care Education/Training Program
